# Patient Record
Sex: Male | Born: 1950
Health system: Southern US, Community
[De-identification: ages and names within clinical notes are randomized; demographics above are authoritative.]

## PROBLEM LIST (undated history)

## (undated) DIAGNOSIS — R918 Other nonspecific abnormal finding of lung field: Secondary | ICD-10-CM

## (undated) DIAGNOSIS — E559 Vitamin D deficiency, unspecified: Secondary | ICD-10-CM

## (undated) DIAGNOSIS — I639 Cerebral infarction, unspecified: Secondary | ICD-10-CM

## (undated) DIAGNOSIS — E1165 Type 2 diabetes mellitus with hyperglycemia: Secondary | ICD-10-CM

## (undated) DIAGNOSIS — E119 Type 2 diabetes mellitus without complications: Secondary | ICD-10-CM

## (undated) DIAGNOSIS — R7303 Prediabetes: Secondary | ICD-10-CM

## (undated) DIAGNOSIS — G629 Polyneuropathy, unspecified: Secondary | ICD-10-CM

## (undated) HISTORY — DX: Other nonspecific abnormal finding of lung field: R91.8

## (undated) HISTORY — PX: APPENDECTOMY: SHX54

---
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
## 2017-01-13 ENCOUNTER — Encounter (HOSPITAL_COMMUNITY): Payer: Self-pay | Admitting: *Deleted

## 2017-01-13 ENCOUNTER — Emergency Department (HOSPITAL_COMMUNITY)
Admission: EM | Admit: 2017-01-13 | Discharge: 2017-01-13 | Disposition: A | Discharge status: Home / Self Care | Payer: Medicare HMO | Attending: Emergency Medicine | Admitting: Emergency Medicine

## 2017-01-13 ENCOUNTER — Emergency Department (HOSPITAL_COMMUNITY): Payer: Medicare HMO

## 2017-01-13 DIAGNOSIS — M545 Low back pain: Secondary | ICD-10-CM | POA: Insufficient documentation

## 2017-01-13 DIAGNOSIS — M79604 Pain in right leg: Secondary | ICD-10-CM | POA: Diagnosis present

## 2017-01-13 DIAGNOSIS — M25551 Pain in right hip: Secondary | ICD-10-CM | POA: Diagnosis not present

## 2017-01-13 DIAGNOSIS — F17210 Nicotine dependence, cigarettes, uncomplicated: Secondary | ICD-10-CM | POA: Diagnosis not present

## 2017-01-13 HISTORY — DX: Prediabetes: R73.03

## 2017-01-13 MED ORDER — TRAMADOL HCL 50 MG PO TABS: 50.0000 mg | ORAL_TABLET | Freq: Four times a day (QID) | ORAL | 0 refills | Status: DC | PRN

## 2017-01-13 MED ORDER — DICLOFENAC SODIUM 75 MG PO TBEC: 75.0000 mg | DELAYED_RELEASE_TABLET | Freq: Two times a day (BID) | ORAL | 0 refills | Status: DC

## 2017-01-13 NOTE — ED Notes (Signed)
Pt made aware to return if symptoms worsen or if any life threatening symptoms occur.   

## 2017-01-13 NOTE — ED Triage Notes (Signed)
Pt reports right upper leg, hip, low back and knee pain x 7 days. Pt denies any known injury. Pt taking OTC pain medications without relief. Pt reports pain is tolerable during the day but is worse at night when he lays down. Describes pain as "throbbing"

## 2017-01-16 NOTE — ED Provider Notes (Signed)
[facility] DEPT Provider Note   CSN: [id number] Arrival date & time: 01/13/17  1324     History   Chief Complaint Chief Complaint  Patient presents with  . Leg Pain    HPI Collin Yoder is a 66 y.o. male presenting with a 1 week history of right hip and right upper thigh pain described as aching and throbbing which is worse at night when trying to sleep, during the day is more tolerable, but describes a more burning, tingling sensation in the mid thigh.  He denies a prior history of back or leg problems, denies any recent injury. There has been no weakness or numbness in the lower extremities and no urinary or bowel retention or incontinence.  Patient does not have a history of cancer or IVDU.  The patient has tried muscle rubs and tylenol without significant relief of symptoms. .  The history is provided by the patient.    Past Medical History:  Diagnosis Date  . Borderline diabetes     There are no active problems to display for this patient.   Past Surgical History:  Procedure Laterality Date  . APPENDECTOMY         Home Medications    Prior to Admission medications   Medication Sig Start Date End Date Taking? Authorizing Provider  diclofenac (VOLTAREN) 75 MG EC tablet Take 1 tablet (75 mg total) by mouth 2 (two) times daily. 01/13/17   Evalee Jefferson, PA-C  traMADol (ULTRAM) 50 MG tablet Take 1 tablet (50 mg total) by mouth every 6 (six) hours as needed. 01/13/17   Evalee Jefferson, PA-C    Family History No family history on file.  Social History Social History  Substance Use Topics  . Smoking status: Current Every Day Smoker    Packs/day: 1.00    Types: Cigarettes  . Smokeless tobacco: Never Used  . Alcohol use No     Allergies   Patient has no known allergies.   Review of Systems Review of Systems  Constitutional: Negative for fever.  Musculoskeletal: Positive for arthralgias and back pain. Negative for joint swelling and myalgias.  Neurological:  Negative for weakness and numbness.     Physical Exam Updated Vital Signs BP (!) 145/79 (BP Location: Right Arm)   Pulse 97   Temp 98.2 F (36.8 C) (Oral)   Resp 16   SpO2 97%   Physical Exam  Constitutional: He appears well-developed and well-nourished.  HENT:  Head: Normocephalic.  Eyes: Conjunctivae are normal.  Neck: Normal range of motion. Neck supple.  Cardiovascular: Normal rate and intact distal pulses.   Pedal pulses normal.  Pulmonary/Chest: Effort normal.  Abdominal: Soft. Bowel sounds are normal. He exhibits no distension and no mass.  Musculoskeletal: Normal range of motion. He exhibits no edema or tenderness.       Right hip: He exhibits normal range of motion, no tenderness, no bony tenderness and no swelling.       Lumbar back: He exhibits no swelling, no edema and no spasm.  Neurological: He is alert. He has normal strength. He displays no atrophy and no tremor. No sensory deficit. Gait normal.  Reflex Scores:      Patellar reflexes are 2+ on the right side and 2+ on the left side.      Achilles reflexes are 2+ on the right side and 2+ on the left side. No strength deficit noted in hip and knee flexor and extensor muscle groups.  Ankle flexion and extension intact.  Skin: Skin is warm and dry. No rash noted.  Psychiatric: He has a normal mood and affect.  Nursing note and vitals reviewed.    ED Treatments / Results  Labs (all labs ordered are listed, but only abnormal results are displayed) Labs Reviewed - No data to display  EKG  EKG Interpretation None       Radiology  No results found for this or any previous visit. Dg Hip Unilat W Or Wo Pelvis 2-3 Views Right  Result Date: 01/13/2017 CLINICAL DATA:  Right hip pain.  Initial encounter. EXAM: DG HIP (WITH OR WITHOUT PELVIS) 2-3V RIGHT COMPARISON:  None. FINDINGS: Right hip is located. No acute bone or soft tissue abnormalities are present. Pelvis is intact. Degenerative changes are noted in the  lower lumbar spine. Vascular calcifications are evident. IMPRESSION: 1. No acute or focal abnormality of the hip to explain the patient's pain. 2. Degenerative changes in the lower lumbar spine. 3. Atherosclerosis. Electronically Signed   By: San Morelle M.D.   On: 01/13/2017 14:20   Dg Femur Min 2 Views Right  Result Date: 01/13/2017 CLINICAL DATA:  Right leg pain for 5 days EXAM: RIGHT FEMUR 2 VIEWS COMPARISON:  None. FINDINGS: No acute fracture. No dislocation. Osteopenia. Vascular calcifications. IMPRESSION: No acute bony pathology. Electronically Signed   By: Marybelle Killings M.D.   On: 01/13/2017 14:20     Procedures Procedures (including critical care time)  Medications Ordered in ED Medications - No data to display   Initial Impression / Assessment and Plan / ED Course  I have reviewed the triage vital signs and the nursing notes.  Pertinent labs & imaging results that were available during my care of the patient were reviewed by me and considered in my medical decision making (see chart for details).     Pt also seen by Dr. Roderic Palau during this visit.  Suspect musculoskeletal source, possibly radicular pain, although no hx of lumbar issues.  No rash, doubt shingles. No leg edema.   Advised heat, rest, diclofenac, ultram, prn f/u - referrals for pcp given.  Final Clinical Impressions(s) / ED Diagnoses   Final diagnoses:  Right leg pain    New Prescriptions Discharge Medication List as of 01/13/2017  3:15 PM    START taking these medications   Details  diclofenac (VOLTAREN) 75 MG EC tablet Take 1 tablet (75 mg total) by mouth 2 (two) times daily., Starting Sat 01/13/2017, Print    traMADol (ULTRAM) 50 MG tablet Take 1 tablet (50 mg total) by mouth every 6 (six) hours as needed., Starting Sat 01/13/2017, Print         Evalee Jefferson, PA-C 01/16/17 1041    Milton Ferguson, MD 01/23/17 2127

## 2017-01-29 ENCOUNTER — Telehealth: Payer: Self-pay | Admitting: Orthopedic Surgery

## 2017-01-29 NOTE — Telephone Encounter (Signed)
Patient called to inquire if Dr Aline Brochure is accepting new patients. Relays he was seen at [facility] room 01/13/17 for right leg pain. Michela Pitcher it might be related to gout or possibly diabetes. States he was advised to see primary care and orthopaedics if not improving.  States he needs to find a new primary care doctor. Offered appointment, however, patient states he will pursue primary care first, and if needed, will call back to request appointment.  Patient's ph# [phone].

## 2017-02-01 DIAGNOSIS — M79606 Pain in leg, unspecified: Secondary | ICD-10-CM | POA: Diagnosis not present

## 2017-02-01 DIAGNOSIS — E559 Vitamin D deficiency, unspecified: Secondary | ICD-10-CM | POA: Diagnosis not present

## 2017-02-01 DIAGNOSIS — Z131 Encounter for screening for diabetes mellitus: Secondary | ICD-10-CM | POA: Diagnosis not present

## 2017-02-01 DIAGNOSIS — N401 Enlarged prostate with lower urinary tract symptoms: Secondary | ICD-10-CM | POA: Diagnosis not present

## 2017-02-01 DIAGNOSIS — R5383 Other fatigue: Secondary | ICD-10-CM | POA: Diagnosis not present

## 2017-02-01 DIAGNOSIS — N481 Balanitis: Secondary | ICD-10-CM | POA: Diagnosis not present

## 2017-02-01 DIAGNOSIS — E11610 Type 2 diabetes mellitus with diabetic neuropathic arthropathy: Secondary | ICD-10-CM | POA: Diagnosis not present

## 2017-02-01 DIAGNOSIS — Z125 Encounter for screening for malignant neoplasm of prostate: Secondary | ICD-10-CM | POA: Diagnosis not present

## 2017-02-01 DIAGNOSIS — Z13220 Encounter for screening for lipoid disorders: Secondary | ICD-10-CM | POA: Diagnosis not present

## 2017-02-08 DIAGNOSIS — Z713 Dietary counseling and surveillance: Secondary | ICD-10-CM | POA: Diagnosis not present

## 2017-02-08 DIAGNOSIS — E559 Vitamin D deficiency, unspecified: Secondary | ICD-10-CM | POA: Diagnosis not present

## 2017-02-08 DIAGNOSIS — M79606 Pain in leg, unspecified: Secondary | ICD-10-CM | POA: Diagnosis not present

## 2017-02-08 DIAGNOSIS — E11610 Type 2 diabetes mellitus with diabetic neuropathic arthropathy: Secondary | ICD-10-CM | POA: Diagnosis not present

## 2017-02-27 DIAGNOSIS — E11610 Type 2 diabetes mellitus with diabetic neuropathic arthropathy: Secondary | ICD-10-CM | POA: Diagnosis not present

## 2017-02-27 DIAGNOSIS — E559 Vitamin D deficiency, unspecified: Secondary | ICD-10-CM | POA: Diagnosis not present

## 2017-02-27 DIAGNOSIS — M79606 Pain in leg, unspecified: Secondary | ICD-10-CM | POA: Diagnosis not present

## 2017-03-29 ENCOUNTER — Emergency Department (HOSPITAL_COMMUNITY): Payer: Medicare HMO

## 2017-03-29 ENCOUNTER — Encounter (HOSPITAL_COMMUNITY): Payer: Self-pay | Admitting: *Deleted

## 2017-03-29 ENCOUNTER — Emergency Department (HOSPITAL_COMMUNITY)
Admission: EM | Admit: 2017-03-29 | Discharge: 2017-03-29 | Disposition: A | Discharge status: Home / Self Care | Payer: Medicare HMO | Attending: Emergency Medicine | Admitting: Emergency Medicine

## 2017-03-29 DIAGNOSIS — E119 Type 2 diabetes mellitus without complications: Secondary | ICD-10-CM | POA: Diagnosis not present

## 2017-03-29 DIAGNOSIS — R911 Solitary pulmonary nodule: Secondary | ICD-10-CM | POA: Diagnosis not present

## 2017-03-29 DIAGNOSIS — Z7984 Long term (current) use of oral hypoglycemic drugs: Secondary | ICD-10-CM | POA: Insufficient documentation

## 2017-03-29 DIAGNOSIS — K4020 Bilateral inguinal hernia, without obstruction or gangrene, not specified as recurrent: Secondary | ICD-10-CM | POA: Insufficient documentation

## 2017-03-29 DIAGNOSIS — F17210 Nicotine dependence, cigarettes, uncomplicated: Secondary | ICD-10-CM | POA: Insufficient documentation

## 2017-03-29 DIAGNOSIS — M79604 Pain in right leg: Secondary | ICD-10-CM | POA: Insufficient documentation

## 2017-03-29 DIAGNOSIS — Z0389 Encounter for observation for other suspected diseases and conditions ruled out: Secondary | ICD-10-CM | POA: Diagnosis not present

## 2017-03-29 DIAGNOSIS — J439 Emphysema, unspecified: Secondary | ICD-10-CM | POA: Diagnosis not present

## 2017-03-29 DIAGNOSIS — M79661 Pain in right lower leg: Secondary | ICD-10-CM | POA: Diagnosis not present

## 2017-03-29 DIAGNOSIS — R634 Abnormal weight loss: Secondary | ICD-10-CM | POA: Diagnosis not present

## 2017-03-29 HISTORY — DX: Type 2 diabetes mellitus without complications: E11.9

## 2017-03-29 LAB — URINALYSIS, ROUTINE W REFLEX MICROSCOPIC
BILIRUBIN URINE: NEGATIVE
Glucose, UA: 50 mg/dL — AB
Hgb urine dipstick: NEGATIVE
KETONES UR: NEGATIVE mg/dL
Leukocytes, UA: NEGATIVE
NITRITE: NEGATIVE
PROTEIN: NEGATIVE mg/dL
Specific Gravity, Urine: 1.02 (ref 1.005–1.030)
pH: 6 (ref 5.0–8.0)

## 2017-03-29 LAB — COMPREHENSIVE METABOLIC PANEL
ALK PHOS: 62 U/L (ref 38–126)
ALT: 13 U/L — AB (ref 17–63)
AST: 14 U/L — ABNORMAL LOW (ref 15–41)
Albumin: 4.2 g/dL (ref 3.5–5.0)
Anion gap: 9 (ref 5–15)
BUN: 13 mg/dL (ref 6–20)
CALCIUM: 9.5 mg/dL (ref 8.9–10.3)
CO2: 29 mmol/L (ref 22–32)
CREATININE: 0.83 mg/dL (ref 0.61–1.24)
Chloride: 103 mmol/L (ref 101–111)
Glucose, Bld: 176 mg/dL — ABNORMAL HIGH (ref 65–99)
Potassium: 3.6 mmol/L (ref 3.5–5.1)
Sodium: 141 mmol/L (ref 135–145)
Total Bilirubin: 0.8 mg/dL (ref 0.3–1.2)
Total Protein: 7.4 g/dL (ref 6.5–8.1)

## 2017-03-29 LAB — CBC WITH DIFFERENTIAL/PLATELET
Basophils Absolute: 0 10*3/uL (ref 0.0–0.1)
Basophils Relative: 1 %
EOS PCT: 3 %
Eosinophils Absolute: 0.3 10*3/uL (ref 0.0–0.7)
HCT: 38.2 % — ABNORMAL LOW (ref 39.0–52.0)
Hemoglobin: 13.2 g/dL (ref 13.0–17.0)
LYMPHS ABS: 2 10*3/uL (ref 0.7–4.0)
Lymphocytes Relative: 23 %
MCH: 31.2 pg (ref 26.0–34.0)
MCHC: 34.6 g/dL (ref 30.0–36.0)
MCV: 90.3 fL (ref 78.0–100.0)
MONOS PCT: 7 %
Monocytes Absolute: 0.6 10*3/uL (ref 0.1–1.0)
Neutro Abs: 5.9 10*3/uL (ref 1.7–7.7)
Neutrophils Relative %: 66 %
PLATELETS: 212 10*3/uL (ref 150–400)
RBC: 4.23 MIL/uL (ref 4.22–5.81)
RDW: 12.4 % (ref 11.5–15.5)
WBC: 8.8 10*3/uL (ref 4.0–10.5)

## 2017-03-29 LAB — LIPASE, BLOOD: LIPASE: 30 U/L (ref 11–51)

## 2017-03-29 MED ORDER — HYDROCODONE-ACETAMINOPHEN 5-325 MG PO TABS
1.0000 | ORAL_TABLET | Freq: Once | ORAL | Status: AC
  Administered 2017-03-29: 1 via ORAL
  Filled 2017-03-29: qty 1

## 2017-03-29 MED ORDER — LACTATED RINGERS IV BOLUS (SEPSIS)
1000.0000 mL | Freq: Once | INTRAVENOUS | Status: AC
  Administered 2017-03-29: 1000 mL via INTRAVENOUS

## 2017-03-29 MED ORDER — IOPAMIDOL (ISOVUE-300) INJECTION 61%
100.0000 mL | Freq: Once | INTRAVENOUS | Status: AC | PRN
  Administered 2017-03-29: 100 mL via INTRAVENOUS

## 2017-03-29 MED ORDER — HYDROCODONE-ACETAMINOPHEN 5-325 MG PO TABS: 1.0000 | ORAL_TABLET | Freq: Four times a day (QID) | ORAL | 0 refills | Status: DC | PRN

## 2017-03-29 NOTE — ED Provider Notes (Signed)
[facility] DEPT Provider Note   CSN: [id number] Arrival date & time: 03/29/17  1140     History   Chief Complaint Chief Complaint  Patient presents with  . Leg Pain    HPI Collin Yoder is a 66 y.o. male.   Leg Pain   This is a new problem. The current episode started more than 1 week ago. The problem occurs constantly. The problem has been gradually worsening. The pain is present in the right upper leg.    Past Medical History:  Diagnosis Date  . Borderline diabetes   . Diabetes mellitus without complication ([place])     There are no active problems to display for this patient.   Past Surgical History:  Procedure Laterality Date  . APPENDECTOMY         Home Medications    Prior to Admission medications   Medication Sig Start Date End Date Taking? Authorizing Provider  Cholecalciferol (VITAMIN D3) 5000 units CAPS Take 1 capsule by mouth daily.   Yes [provider]  metFORMIN (GLUCOPHAGE) 500 MG tablet Take 500 mg by mouth 2 (two) times daily with a meal.   Yes [provider]  diclofenac (VOLTAREN) 75 MG EC tablet Take 1 tablet (75 mg total) by mouth 2 (two) times daily. Patient not taking: Reported on 03/29/2017 01/13/17   Evalee Jefferson, PA-C  traMADol (ULTRAM) 50 MG tablet Take 1 tablet (50 mg total) by mouth every 6 (six) hours as needed. Patient not taking: Reported on 03/29/2017 01/13/17   Evalee Jefferson, PA-C    Family History No family history on file.  Social History Social History  Substance Use Topics  . Smoking status: Current Every Day Smoker    Packs/day: 1.00    Types: Cigarettes  . Smokeless tobacco: Never Used  . Alcohol use No     Allergies   Patient has no known allergies.   Review of Systems Review of Systems  Constitutional: Positive for activity change, appetite change and unexpected weight change.  Respiratory: Positive for cough.   Gastrointestinal: Positive for nausea.  Musculoskeletal:       Right leg  pain  All other systems reviewed and are negative.    Physical Exam Updated Vital Signs BP 109/72 (BP Location: Right Arm)   Pulse (!) 120   Temp 98.2 F (36.8 C) (Oral)   Resp 18   Ht 5\' 9"  (1.753 m)   Wt 61.2 kg (135 lb)   SpO2 96%   BMI 19.94 kg/m   Physical Exam  Constitutional: He appears well-developed.  Concave stomach, mildly cachectic  HENT:  Head: Normocephalic and atraumatic.  Eyes: Conjunctivae and EOM are normal.  Neck: Normal range of motion.  Cardiovascular: Normal rate.   Pulmonary/Chest: Effort normal. No respiratory distress.  Abdominal: Soft. He exhibits no distension.  Musculoskeletal: Normal range of motion.  Neurological: He is alert.  Skin: Skin is warm and dry. No erythema. No pallor.  Nursing note and vitals reviewed.    ED Treatments / Results  Labs (all labs ordered are listed, but only abnormal results are displayed) Labs Reviewed  CBC WITH DIFFERENTIAL/PLATELET  COMPREHENSIVE METABOLIC PANEL  URINALYSIS, ROUTINE W REFLEX MICROSCOPIC  LIPASE, BLOOD    EKG  EKG Interpretation None       Radiology No results found.  Procedures Procedures (including critical care time)  Medications Ordered in ED Medications  lactated ringers bolus 1,000 mL (not administered)  lactated ringers bolus 1,000 mL (not administered)  HYDROcodone-acetaminophen (  NORCO/VICODIN) 5-325 MG per tablet 1 tablet (not administered)     Initial Impression / Assessment and Plan / ED Course  I have reviewed the triage vital signs and the nursing notes.  Pertinent labs & imaging results that were available during my care of the patient were reviewed by me and considered in my medical decision making (see chart for details).    Concern for possible malignancy, DVT amongst other etiologies. Will work up appropriately.   Likely malignancy in RLL of lung. I discussed with Dr. Talbert Cage who will get him follow up appt for biopsy and to get worked up. Rest of workup  unremarkable. Suspect possible cancer related leg pain (lytic lesion possibly not seen on previous XR's) so if he gets a PET scan, it will likely show up there if that is the cause. No e/o septic joint/dvt/fracture at this time. Pain meds provided.   Final Clinical Impressions(s) / ED Diagnoses   Final diagnoses:  None    New Prescriptions New Prescriptions   No medications on file     Jamie-Lee Galdamez, Corene Cornea, MD 03/29/17 2147

## 2017-03-29 NOTE — ED Triage Notes (Signed)
Pt comes in with chronic right leg pain that doctors told him this was related to his diabetes (neuropathy). Pt states this leg "gave out" on him last night. He adds that he has lost 30 lbs in the last 2 months. Denies any n/v/d.

## 2017-04-02 ENCOUNTER — Encounter (HOSPITAL_COMMUNITY): Payer: Self-pay | Admitting: Oncology

## 2017-04-02 ENCOUNTER — Other Ambulatory Visit (HOSPITAL_COMMUNITY): Payer: Self-pay | Admitting: Oncology

## 2017-04-05 ENCOUNTER — Ambulatory Visit (HOSPITAL_COMMUNITY): Payer: Medicare HMO | Admitting: Oncology

## 2017-04-11 ENCOUNTER — Encounter (HOSPITAL_COMMUNITY): Payer: Medicare HMO | Attending: Oncology | Admitting: Oncology

## 2017-04-11 ENCOUNTER — Encounter (HOSPITAL_COMMUNITY): Payer: Self-pay | Admitting: Oncology

## 2017-04-11 ENCOUNTER — Ambulatory Visit (HOSPITAL_COMMUNITY)
Admission: RE | Admit: 2017-04-11 | Discharge: 2017-04-11 | Disposition: A | Discharge status: Home / Self Care | Payer: Medicare HMO | Source: Ambulatory Visit | Attending: Oncology | Admitting: Oncology

## 2017-04-11 ENCOUNTER — Other Ambulatory Visit (HOSPITAL_COMMUNITY): Payer: Self-pay | Admitting: Oncology

## 2017-04-11 VITALS — BP 102/66 | HR 111 | Temp 98.1°F | Resp 16 | Ht 69.0 in | Wt 131.2 lb

## 2017-04-11 DIAGNOSIS — R918 Other nonspecific abnormal finding of lung field: Secondary | ICD-10-CM

## 2017-04-11 DIAGNOSIS — M50321 Other cervical disc degeneration at C4-C5 level: Secondary | ICD-10-CM | POA: Diagnosis not present

## 2017-04-11 DIAGNOSIS — M79604 Pain in right leg: Secondary | ICD-10-CM

## 2017-04-11 DIAGNOSIS — M5136 Other intervertebral disc degeneration, lumbar region: Secondary | ICD-10-CM | POA: Diagnosis not present

## 2017-04-11 DIAGNOSIS — M5030 Other cervical disc degeneration, unspecified cervical region: Secondary | ICD-10-CM | POA: Diagnosis not present

## 2017-04-11 DIAGNOSIS — M50323 Other cervical disc degeneration at C6-C7 level: Secondary | ICD-10-CM | POA: Diagnosis not present

## 2017-04-11 DIAGNOSIS — M542 Cervicalgia: Secondary | ICD-10-CM

## 2017-04-11 HISTORY — DX: Other nonspecific abnormal finding of lung field: R91.8

## 2017-04-11 NOTE — Progress Notes (Incomplete)
[facility] Hematology/Oncology Consultation   Name: Collin Yoder      MRN: [id number]    Location: Room/bed info not found  Date: 04/11/2017 Time:3:42 PM   REFERRING PHYSICIAN:  [facility]  REASON FOR CONSULT:  Lung mass   DIAGNOSIS:  4.3 x 3.2 x 2.4 cm opacity in the RLL concerning for bronchogenic carcinoma  HISTORY OF PRESENT ILLNESS:   Collin Yoder is a 66 y.o. male with a medical history significant for hyperglycemia who is referred to the [facility] for abnormal imaging when he presented to the [facility] ED on 03/29/2017 with leg pain.  His ED workup included an ultrasound of his right leg that was negative for DVT.  CT chest abdomen pelvis was also performed in the ED.  This revealed a 4.3 cm right lower lobe pulmonary mass without any evidence of metastatic disease.  Given that the patient was asymptomatic for any pneumonia or infectious cause, concern for bronchogenic carcinoma increases.  As result, the patient is referred to the [facility] for further evaluation and management.  Patient understands why he is here today.  He notes that his major concern is his right leg pain but understands that the priority of our appointment today is about his lung mass.  He states that the lung mass is asymptomatic but his right leg pain is not.  He admits to a 35 pound weight loss but this is suspected to be secondary to treatment of his diabetes.  He reports a stable appetite.  He does admit to a decrease in energy.  He does have a cough but denies any hemoptysis.  He reports to shortness of breath with exertion.  He has bilateral numbness and tingling in his lower extremities.  He rates his appetite is 75%.  He rates his energy level at 50%.  He notes that his right leg pain is constant and he rates it as a 10 out of 10.  The location of his leg pain seems to be on the medial aspect of his lower leg.  He also reports some right neck  pain.  Review of Systems  Constitutional: Positive for weight loss. Negative for chills and fever.  HENT: Negative.   Eyes: Negative.   Respiratory: Positive for cough. Negative for hemoptysis.   Cardiovascular: Negative.  Negative for chest pain.  Gastrointestinal: Negative.  Negative for blood in stool, constipation, diarrhea, melena, nausea and vomiting.  Genitourinary: Negative.   Musculoskeletal: Positive for neck pain.       Right leg pain  Skin: Negative.   Neurological: Positive for sensory change (numbness/tingling of legs). Negative for weakness.  Endo/Heme/Allergies: Negative.   Psychiatric/Behavioral: Positive for substance abuse (tobacco abuse).     PAST MEDICAL HISTORY:   Past Medical History:  Diagnosis Date  . Borderline diabetes   . Diabetes mellitus without complication ([place])   . Right lower lobe lung mass 04/11/2017    ALLERGIES: No Known Allergies    MEDICATIONS: I have reviewed the patient's current medications.    Current Outpatient Prescriptions on File Prior to Visit  Medication Sig Dispense Refill  . Cholecalciferol (VITAMIN D3) 5000 units CAPS Take 1 capsule by mouth daily.    Marland Kitchen HYDROcodone-acetaminophen (NORCO/VICODIN) 5-325 MG tablet Take 1-2 tablets by mouth every 6 (six) hours as needed for severe pain. 20 tablet 0  . metFORMIN (GLUCOPHAGE) 500 MG tablet Take 500 mg by mouth 2 (two) times  daily with a meal.     No current facility-administered medications on file prior to visit.      PAST SURGICAL HISTORY Past Surgical History:  Procedure Laterality Date  . APPENDECTOMY      FAMILY HISTORY: He has 1 brother who lives in [place], [place].  He is healthy. He has 1 son who is 19 years old and is healthy to the best of patient's knowledge and he lives in [place], [place].  SOCIAL HISTORY:  reports that he has been smoking Cigarettes.  He has a 40.00 pack-year smoking history. He has never used smokeless tobacco. He reports  that he does not drink alcohol or use drugs.  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  He has Psychologist, forensic and religion.  Unfortunately, the patient was incarcerated for 6 years being released in November 2014 and is now on house arrest with bracelet on left lower leg.  Based upon brief conversation, it sounds as though he was incarcerated for statutory rape.  He reports that he is a "model" personal probation.  His probation officer is officer Anthoney Harada and his office number is [phone] and his cell phone number is [phone].  Social History   Social History  . Marital status: Single    Spouse name: N/A  . Number of children: N/A  . Years of education: N/A   Social History Main Topics  . Smoking status: Current Every Day Smoker    Packs/day: 1.00    Years: 40.00    Types: Cigarettes  . Smokeless tobacco: Never Used  . Alcohol use No  . Drug use: No  . Sexual activity: Not Asked   Other Topics Concern  . None   Social History Narrative  . None    PERFORMANCE STATUS: The patient's performance status is 1 - Symptomatic but completely ambulatory  PHYSICAL EXAM: Most Recent Vital Signs: Blood pressure 102/66, pulse (!) 111, temperature 98.1 F (36.7 C), temperature source Oral, resp. rate 16, height 5\' 9"  (1.753 m), weight 131 lb 3.2 oz (59.5 kg), SpO2 98 %. BP 102/66 (BP Location: Left Arm, Patient Position: Sitting)   Pulse (!) 111   Temp 98.1 F (36.7 C) (Oral)   Resp 16   Ht 5\' 9"  (1.753 m)   Wt 131 lb 3.2 oz (59.5 kg)   SpO2 98%   BMI 19.37 kg/m   General Appearance:    Alert, cooperative, no distress, appears stated age, unaccompanied  Head:    Normocephalic, without obvious abnormality, atraumatic  Eyes:    Conjunctiva/corneas clear, EOM's intact, both eyes       Ears:    Normal TM's and external ear canals, both ears  Nose:   Nares normal, septum midline, mucosa normal, no drainage    or sinus tenderness  Throat:   Lips, mucosa, and tongue  normal.  Neck:   Supple, symmetrical, trachea midline, no adenopathy  Back:     Symmetric, no curvature, ROM normal, no CVA tenderness  Lungs:     Clear to auscultation bilaterally, respirations unlabored, decreased breath sounds bilaterally.  Chest wall:    No tenderness or deformity  Heart:    Regular rate and rhythm, S1 and S2 normal, no murmur, rub   or gallop  Abdomen:     Soft, non-tender, bowel sounds active all four quadrants,    no masses, no organomegaly  Genitalia:    Not examined  Rectal:    Not examined  Extremities:  Extremities normal, atraumatic, no cyanosis or edema.  Left ankle monitor in place.  No abnormality of right leg.  Pulses:   Not examined  Skin:   Skin color, texture, turgor normal, no rashes or lesions  Lymph nodes:   Cervical, supraclavicular, and axillary nodes normal  Neurologic:   No focal deficits.    LABORATORY DATA:  CBC    Component Value Date/Time   WBC 8.8 03/29/2017 1255   RBC 4.23 03/29/2017 1255   HGB 13.2 03/29/2017 1255   HCT 38.2 (L) 03/29/2017 1255   PLT 212 03/29/2017 1255   MCV 90.3 03/29/2017 1255   MCH 31.2 03/29/2017 1255   MCHC 34.6 03/29/2017 1255   RDW 12.4 03/29/2017 1255   LYMPHSABS 2.0 03/29/2017 1255   MONOABS 0.6 03/29/2017 1255   EOSABS 0.3 03/29/2017 1255   BASOSABS 0.0 03/29/2017 1255     Chemistry      Component Value Date/Time   NA 141 03/29/2017 1255   K 3.6 03/29/2017 1255   CL 103 03/29/2017 1255   CO2 29 03/29/2017 1255   BUN 13 03/29/2017 1255   CREATININE 0.83 03/29/2017 1255      Component Value Date/Time   CALCIUM 9.5 03/29/2017 1255   ALKPHOS 62 03/29/2017 1255   AST 14 (L) 03/29/2017 1255   ALT 13 (L) 03/29/2017 1255   BILITOT 0.8 03/29/2017 1255       RADIOGRAPHY: Dg Cervical Spine Complete  Result Date: 04/11/2017 CLINICAL DATA:  Right neck pain.  New lung mass EXAM: CERVICAL SPINE - COMPLETE 4+ VIEW COMPARISON:  None. FINDINGS: Degenerative disc disease from C4-5 thru C6-7, most  pronounced at C5-6 with disc space narrowing and spurring. Bilateral neural foraminal narrowing at C4-5 thru C6-7 due to uncovertebral spurring. Degenerative facet disease. Normal alignment. No fracture. IMPRESSION: Degenerative disc and facet disease as above.  No acute findings. Electronically Signed   By: Rolm Baptise M.D.   On: 04/11/2017 14:02   Dg Thoracic Spine W/swimmers  Result Date: 04/11/2017 CLINICAL DATA:  Right neck and upper back pain. Right lower lung mass. EXAM: THORACIC SPINE - 3 VIEWS COMPARISON:  CT 03/29/2017 FINDINGS: Early degenerative changes with disc space narrowing and spurring throughout the thoracic spine. No fracture, subluxation or visible focal bony abnormality. IMPRESSION: No acute findings. Electronically Signed   By: Rolm Baptise M.D.   On: 04/11/2017 14:02       PATHOLOGY:  N/A  ASSESSMENT/PLAN: ***  No problem-specific Assessment & Plan notes found for this encounter.   ORDERS PLACED FOR THIS ENCOUNTER: Orders Placed This Encounter  Procedures  . DG Cervical Spine Complete  . NM PET Image Initial (PI) Skull Base To Thigh  . MR Brain W Wo Contrast  . MR Lumbar Spine W Wo Contrast    MEDICATIONS PRESCRIBED THIS ENCOUNTER: No orders of the defined types were placed in this encounter.   All questions were answered. The patient knows to call the clinic with any problems, questions or concerns. We can certainly see the patient much sooner if necessary.  Patient discussed with Dr. Talbert Cage and together we ascertained an up-to-date interval history, and examined the patient.  Dr. Talbert Cage developed the patient's assessment and plan.  This was a shared visit-consultation.  Her attestation will follow below.  This note is electronically signed by: Doy Mince 04/11/2017 3:42 PM

## 2017-04-11 NOTE — Patient Instructions (Signed)
You were seen today by Kirby Crigler, PA. Before leaving the hospital today we want you to get an xray of you neck and back. We will schedule you an appointment for a PET scan, MRI of your brain and back. Also, we are referring you to a cardiothoracic surgery.

## 2017-04-11 NOTE — Progress Notes (Unsigned)
[facility] Hematology/Oncology Consultation   Name: Collin Yoder      MRN: [id number]    Location: Room/bed info not found  Date: 04/11/2017 Time:4:33 PM   REFERRING PHYSICIAN:  [facility]  REASON FOR CONSULT:  Lung mass   DIAGNOSIS:  4.3 x 3.2 x 2.4 cm opacity in the RLL concerning for bronchogenic carcinoma  HISTORY OF PRESENT ILLNESS:   Collin Yoder is a 66 y.o. male with a medical history significant for hyperglycemia who is referred to the [facility] for abnormal imaging when he presented to the [facility] ED on 03/29/2017 with leg pain.  His workup in the emergency department included a Doppler study of his right leg.  This was negative for any DVT.  Additional workup included CT chest abdomen and pelvis.  Imaging demonstrated a 4.3 cm right lower lobe lesion concerning for bronchogenic carcinoma given the fact that the patient was asymptomatic for any infectious issues.  As result, the patient was referred to [facility] for further workup and evaluation.  Patient reports that his main complaint is pain in his right leg.  He says this been going on for quite some time.  He had this evaluated by his primary care physician who was planning on referring him to orthopod.  Patient reports that 5 weeks following his primary care appointment, he had not heard from orthopedic surgery regarding an appointment.  As result, he presented to the emergency department with his right lower leg pain described above.  He notes that the leg pain is constant.  He rates it as a 10 out of 10.  He also admits to a 35 pound weight loss which is thought to be secondary to diabetes and diabetic management.  He admits to a cough that is nonproductive.  He denies any hemoptysis.  He rates his appetite is 75%. He rates his energy level of 50%.  He reports shortness of breath upon exertion.  He does have bilateral lower extremity numbness and burning  secondary to diabetes.  In addition to his right leg pain, he notes right neck pain as well.  Review of Systems  Constitutional: Positive for weight loss. Negative for chills and fever.  HENT: Negative.   Eyes: Negative.   Respiratory: Positive for cough and shortness of breath. Negative for hemoptysis and sputum production.   Cardiovascular: Negative.  Negative for chest pain.  Gastrointestinal: Negative.  Negative for blood in stool, constipation, diarrhea, melena, nausea and vomiting.  Genitourinary: Negative.   Musculoskeletal: Positive for neck pain.       Right leg pain  Skin: Negative.   Neurological: Positive for sensory change (LE B/L). Negative for weakness.  Endo/Heme/Allergies: Negative.   Psychiatric/Behavioral: Positive for substance abuse (tobacco abuse).     PAST MEDICAL HISTORY:   Past Medical History:  Diagnosis Date  . Borderline diabetes   . Diabetes mellitus without complication ([place])   . Right lower lobe lung mass 04/11/2017    ALLERGIES: No Known Allergies    MEDICATIONS: I have reviewed the patient's current medications.    Current Outpatient Prescriptions on File Prior to Visit  Medication Sig Dispense Refill  . Cholecalciferol (VITAMIN D3) 5000 units CAPS Take 1 capsule by mouth daily.    Marland Kitchen HYDROcodone-acetaminophen (NORCO/VICODIN) 5-325 MG tablet Take 1-2 tablets by mouth every 6 (six) hours as needed for severe pain. 20 tablet 0  . metFORMIN (GLUCOPHAGE) 500 MG  tablet Take 500 mg by mouth 2 (two) times daily with a meal.     No current facility-administered medications on file prior to visit.      PAST SURGICAL HISTORY Past Surgical History:  Procedure Laterality Date  . APPENDECTOMY      FAMILY HISTORY:  1 brother who lives in [place], [place] One son 21 years old who is healthy who lives in [place], [place].  SOCIAL HISTORY:  reports that he has been smoking Cigarettes.  He has a 40.00 pack-year smoking history. He  has never used smokeless tobacco. He reports that he does not drink alcohol or use drugs.  He smokes 1 pack per day of cigarettes.  He works second shift at [employer] in [place], [place].  He is Psychologist, forensic and religion.  He is divorced 1.  Unfortunately, he was incarcerated for 6 years being released in November 2014 and remains on lifelong house arrest.  He has a left ankle monitor in place.  Based upon discussion, patient was convicted of statutory rape.  Social History   Social History  . Marital status: Single    Spouse name: N/A  . Number of children: N/A  . Years of education: N/A   Social History Main Topics  . Smoking status: Current Every Day Smoker    Packs/day: 1.00    Years: 40.00    Types: Cigarettes  . Smokeless tobacco: Never Used  . Alcohol use No  . Drug use: No  . Sexual activity: Not Asked   Other Topics Concern  . None   Social History Narrative  . None    PERFORMANCE STATUS: The patient's performance status is 1 - Symptomatic but completely ambulatory  PHYSICAL EXAM: Most Recent Vital Signs: Blood pressure 102/66, pulse (!) 111, temperature 98.1 F (36.7 C), temperature source Oral, resp. rate 16, height 5\' 9"  (1.753 m), weight 131 lb 3.2 oz (59.5 kg), SpO2 98 %. BP 102/66 (BP Location: Left Arm, Patient Position: Sitting)   Pulse (!) 111   Temp 98.1 F (36.7 C) (Oral)   Resp 16   Ht 5\' 9"  (1.753 m)   Wt 131 lb 3.2 oz (59.5 kg)   SpO2 98%   BMI 19.37 kg/m   General Appearance:    Alert, cooperative, no distress, appears stated age, unaccompanied  Head:    Normocephalic, without obvious abnormality, atraumatic  Eyes:    Conjunctiva/corneas clear, EOM's intact, both eyes       Ears:    Not examined  Nose:   Nares normal, septum midline, mucosa normal, no drainage    or sinus tenderness  Throat:   Lips, mucosa, and tongue normal.  Neck:   Supple, symmetrical, trachea midline, no adenopathy.  Back:     Symmetric, no curvature, ROM normal, no  CVA tenderness  Lungs:     Clear to auscultation bilaterally, respirations unlabored, decreased breath sounds bilaterally throughout  Chest wall:    No tenderness or deformity  Heart:    Regular rate and rhythm, S1 and S2 normal, no murmur, rub   or gallop  Abdomen:     Soft, non-tender, bowel sounds active all four quadrants,    no masses, no organomegaly  Genitalia:    Not examined  Rectal:    Not examined  Extremities:   Extremities normal, atraumatic, no cyanosis or edema.  Left ankle monitor in place.  Pulses:   Not examined  Skin:   Skin color, texture, turgor normal, no rashes  or lesions  Lymph nodes:   Cervical, supraclavicular, and axillary nodes normal  Neurologic:   No focal deficits    LABORATORY DATA:  CBC    Component Value Date/Time   WBC 8.8 03/29/2017 1255   RBC 4.23 03/29/2017 1255   HGB 13.2 03/29/2017 1255   HCT 38.2 (L) 03/29/2017 1255   PLT 212 03/29/2017 1255   MCV 90.3 03/29/2017 1255   MCH 31.2 03/29/2017 1255   MCHC 34.6 03/29/2017 1255   RDW 12.4 03/29/2017 1255   LYMPHSABS 2.0 03/29/2017 1255   MONOABS 0.6 03/29/2017 1255   EOSABS 0.3 03/29/2017 1255   BASOSABS 0.0 03/29/2017 1255     Chemistry      Component Value Date/Time   NA 141 03/29/2017 1255   K 3.6 03/29/2017 1255   CL 103 03/29/2017 1255   CO2 29 03/29/2017 1255   BUN 13 03/29/2017 1255   CREATININE 0.83 03/29/2017 1255      Component Value Date/Time   CALCIUM 9.5 03/29/2017 1255   ALKPHOS 62 03/29/2017 1255   AST 14 (L) 03/29/2017 1255   ALT 13 (L) 03/29/2017 1255   BILITOT 0.8 03/29/2017 1255       RADIOGRAPHY: Dg Cervical Spine Complete  Result Date: 04/11/2017 CLINICAL DATA:  Right neck pain.  New lung mass EXAM: CERVICAL SPINE - COMPLETE 4+ VIEW COMPARISON:  None. FINDINGS: Degenerative disc disease from C4-5 thru C6-7, most pronounced at C5-6 with disc space narrowing and spurring. Bilateral neural foraminal narrowing at C4-5 thru C6-7 due to uncovertebral spurring.  Degenerative facet disease. Normal alignment. No fracture. IMPRESSION: Degenerative disc and facet disease as above.  No acute findings. Electronically Signed   By: Rolm Baptise M.D.   On: 04/11/2017 14:02   Dg Thoracic Spine W/swimmers  Result Date: 04/11/2017 CLINICAL DATA:  Right neck and upper back pain. Right lower lung mass. EXAM: THORACIC SPINE - 3 VIEWS COMPARISON:  CT 03/29/2017 FINDINGS: Early degenerative changes with disc space narrowing and spurring throughout the thoracic spine. No fracture, subluxation or visible focal bony abnormality. IMPRESSION: No acute findings. Electronically Signed   By: Rolm Baptise M.D.   On: 04/11/2017 14:02       PATHOLOGY:  N/A  ASSESSMENT/PLAN:   Right lower lobe lung mass 4.3 x 3.2 x 2.4 cm opacity in the RLL concerning for bronchogenic carcinoma in the setting of 40 pack year smoking history.  No role for labs today.  Labs from June 2018 reviewed.  CBC diff, CMET.  I personally reviewed and went over laboratory results with the patient.  The results are noted within this dictation.  I personally reviewed and went over radiographic studies with the patient.  The results are noted within this dictation.  I personally reviewed the images in PACS.  CT chest abdomen pelvis demonstrates a 4.3 cm right lower lobe lesion concerning for primary bronchogenic carcinoma.  Rest of scan does not demonstrate any oncology related issues.  Doppler study of his right leg in June 2018 was negative for any DVT.  He did undergo a lumbar spine x-ray that demonstrated degenerative disease.  For his right leg pain, I have ordered a L-spine MRI to evaluate for any nerve impingement related to his degenerative disease that would explain his leg pain.  For his neck pain, I have ordered a cervical spine xray.  This demonstrates degenerative disc and facet disease.  I will complete his staging process including an PET scan and MRI brain w and wo  contrast.  I have placed a  call to his probation officer, Anthoney Harada (office # [phone]) about having the patient's leg monitor removed at time of MRI.  I have left a VM with my pager number and our office number to assist in coordinating this process.  Based upon MRI L-spine, will refer patient to ortho or vascular surgery.  I suspect his unilateral right leg pain is secondary to nerve impingement from degenerative disease versus a vascular issue.  We will refer patient to Cardiothoracic surgery as well for biopsy versus definitive surgical management based upon PET findings.  Patient will return in 3 weeks for follow-up.     ORDERS PLACED FOR THIS ENCOUNTER: Orders Placed This Encounter  Procedures  . DG Cervical Spine Complete  . NM PET Image Initial (PI) Skull Base To Thigh  . MR Brain W Wo Contrast  . MR Lumbar Spine W Wo Contrast    MEDICATIONS PRESCRIBED THIS ENCOUNTER: No orders of the defined types were placed in this encounter.   All questions were answered. The patient knows to call the clinic with any problems, questions or concerns. We can certainly see the patient much sooner if necessary.  Patient discussed with Dr. Talbert Cage and together we ascertained an up-to-date interval history, and examined the patient.  Dr. Talbert Cage developed the patient's assessment and plan.  This was a shared visit-consultation.  Her attestation will follow below.  This note is electronically signed by: Doy Mince 04/11/2017 4:33 PM

## 2017-04-11 NOTE — Assessment & Plan Note (Signed)
4.3 x 3.2 x 2.4 cm opacity in the RLL concerning for bronchogenic carcinoma in the setting of 40 pack year smoking history.  No role for labs today.  Labs from June 2018 reviewed.  CBC diff, CMET.  I personally reviewed and went over laboratory results with the patient.  The results are noted within this dictation.  I personally reviewed and went over radiographic studies with the patient.  The results are noted within this dictation.  I personally reviewed the images in PACS.  CT chest abdomen pelvis demonstrates a 4.3 cm right lower lobe lesion concerning for primary bronchogenic carcinoma.  Rest of scan does not demonstrate any oncology related issues.  Doppler study of his right leg in June 2018 was negative for any DVT.  He did undergo a lumbar spine x-ray that demonstrated degenerative disease.  For his right leg pain, I have ordered a L-spine MRI to evaluate for any nerve impingement related to his degenerative disease that would explain his leg pain.  For his neck pain, I have ordered a cervical spine xray.  This demonstrates degenerative disc and facet disease.  I will complete his staging process including an PET scan and MRI brain w and wo contrast.  I have placed a call to his probation officer, Anthoney Harada (office # [phone]) about having the patient's leg monitor removed at time of MRI.  I have left a VM with my pager number and our office number to assist in coordinating this process.  Based upon MRI L-spine, will refer patient to ortho or vascular surgery.  I suspect his unilateral right leg pain is secondary to nerve impingement from degenerative disease versus a vascular issue.  We will refer patient to Cardiothoracic surgery as well for biopsy versus definitive surgical management based upon PET findings.  Patient will return in 3 weeks for follow-up.

## 2017-04-16 ENCOUNTER — Other Ambulatory Visit (HOSPITAL_COMMUNITY): Payer: Self-pay | Admitting: Oncology

## 2017-04-18 ENCOUNTER — Ambulatory Visit (HOSPITAL_COMMUNITY)
Admission: RE | Admit: 2017-04-18 | Discharge: 2017-04-18 | Disposition: A | Discharge status: Home / Self Care | Payer: Medicare HMO | Source: Ambulatory Visit | Attending: Oncology | Admitting: Oncology

## 2017-04-18 DIAGNOSIS — M48061 Spinal stenosis, lumbar region without neurogenic claudication: Secondary | ICD-10-CM | POA: Insufficient documentation

## 2017-04-18 DIAGNOSIS — M79604 Pain in right leg: Secondary | ICD-10-CM | POA: Diagnosis not present

## 2017-04-18 DIAGNOSIS — R918 Other nonspecific abnormal finding of lung field: Secondary | ICD-10-CM | POA: Diagnosis not present

## 2017-04-18 DIAGNOSIS — M4856XA Collapsed vertebra, not elsewhere classified, lumbar region, initial encounter for fracture: Secondary | ICD-10-CM | POA: Insufficient documentation

## 2017-04-18 DIAGNOSIS — M5136 Other intervertebral disc degeneration, lumbar region: Secondary | ICD-10-CM | POA: Diagnosis not present

## 2017-04-18 DIAGNOSIS — R51 Headache: Secondary | ICD-10-CM | POA: Diagnosis not present

## 2017-04-18 MED ORDER — GADOBENATE DIMEGLUMINE 529 MG/ML IV SOLN
15.0000 mL | Freq: Once | INTRAVENOUS | Status: AC | PRN
  Administered 2017-04-18: 11 mL via INTRAVENOUS

## 2017-04-19 ENCOUNTER — Encounter (HOSPITAL_COMMUNITY): Payer: Self-pay | Admitting: Oncology

## 2017-04-19 NOTE — Progress Notes (Unsigned)
Patient referred to [facility].  Appt. Scheduled for 05/02/17 @ 2:45 w/Dr Tonita Cong. Pt aware.  Faxed pt records to [phone].

## 2017-04-24 ENCOUNTER — Telehealth (HOSPITAL_COMMUNITY): Payer: Self-pay | Admitting: Oncology

## 2017-04-24 NOTE — Telephone Encounter (Signed)
PET scan is approved after peer to peer completed.  KEFALAS,THOMAS, PA-C 04/24/2017 1:11 PM

## 2017-04-25 ENCOUNTER — Encounter (HOSPITAL_COMMUNITY): Payer: Medicare HMO

## 2017-05-01 ENCOUNTER — Encounter: Payer: Medicare HMO | Admitting: Thoracic Surgery (Cardiothoracic Vascular Surgery)

## 2017-05-02 ENCOUNTER — Ambulatory Visit (HOSPITAL_COMMUNITY): Payer: Medicare HMO

## 2017-05-04 ENCOUNTER — Encounter (HOSPITAL_COMMUNITY)
Admission: RE | Admit: 2017-05-04 | Discharge: 2017-05-04 | Disposition: A | Discharge status: Home / Self Care | Payer: Medicare HMO | Source: Ambulatory Visit | Attending: Oncology | Admitting: Oncology

## 2017-05-04 ENCOUNTER — Telehealth (HOSPITAL_COMMUNITY): Payer: Self-pay | Admitting: Oncology

## 2017-05-04 DIAGNOSIS — R918 Other nonspecific abnormal finding of lung field: Secondary | ICD-10-CM | POA: Diagnosis not present

## 2017-05-04 LAB — GLUCOSE, CAPILLARY: Glucose-Capillary: 172 mg/dL — ABNORMAL HIGH (ref 65–99)

## 2017-05-04 MED ORDER — FLUDEOXYGLUCOSE F - 18 (FDG) INJECTION
5.9100 | Freq: Once | INTRAVENOUS | Status: AC | PRN
  Administered 2017-05-04: 5.91 via INTRAVENOUS

## 2017-05-04 NOTE — Telephone Encounter (Signed)
Called patient today to give him results of his PET scan. There does not appear to be any evidence of malignancy on his PET scan. He does have evidence of a resolving right lower lobe opacification which is likely secondary to infectious process. I have advised him that he also has a new infrahilar nodularity last mildly metabolically active which favors focus of infection, but he will need a follow-up CT of the chest in 3 months to assess resolution of his infectious processes. I also made him aware that he has a small right middle lobe pulmonary nodule which will need a CT chest without contrast for follow-up in one year.  Since he does not have any evidence of malignancy, we will cancel his follow-up up appointment here.  I have advised him to see his primary care physician, Dr. Anastasio Champion, for repeat CT chest in 3 months. I will send a message to his primary care physician as well regarding the need for a CT chest. Patient verbalized understanding.

## 2017-05-09 ENCOUNTER — Ambulatory Visit (HOSPITAL_COMMUNITY): Payer: Medicare HMO

## 2017-05-18 DIAGNOSIS — M545 Low back pain: Secondary | ICD-10-CM | POA: Diagnosis not present

## 2017-05-18 DIAGNOSIS — M5136 Other intervertebral disc degeneration, lumbar region: Secondary | ICD-10-CM | POA: Diagnosis not present

## 2017-06-28 DIAGNOSIS — M79606 Pain in leg, unspecified: Secondary | ICD-10-CM | POA: Diagnosis not present

## 2017-06-28 DIAGNOSIS — E559 Vitamin D deficiency, unspecified: Secondary | ICD-10-CM | POA: Diagnosis not present

## 2017-06-28 DIAGNOSIS — E11610 Type 2 diabetes mellitus with diabetic neuropathic arthropathy: Secondary | ICD-10-CM | POA: Diagnosis not present

## 2017-06-28 DIAGNOSIS — R5383 Other fatigue: Secondary | ICD-10-CM | POA: Diagnosis not present

## 2017-07-09 ENCOUNTER — Ambulatory Visit: Payer: Medicare HMO | Admitting: Neurology

## 2017-07-09 ENCOUNTER — Telehealth: Payer: Self-pay | Admitting: *Deleted

## 2017-07-09 NOTE — Telephone Encounter (Signed)
No showed new patient appointment. 

## 2017-07-10 ENCOUNTER — Encounter: Payer: Self-pay | Admitting: Neurology

## 2017-07-26 DIAGNOSIS — R634 Abnormal weight loss: Secondary | ICD-10-CM | POA: Diagnosis not present

## 2017-07-26 DIAGNOSIS — Z23 Encounter for immunization: Secondary | ICD-10-CM | POA: Diagnosis not present

## 2017-08-02 ENCOUNTER — Other Ambulatory Visit (HOSPITAL_COMMUNITY): Payer: Self-pay | Admitting: Internal Medicine

## 2017-08-02 ENCOUNTER — Ambulatory Visit (HOSPITAL_COMMUNITY)
Admission: RE | Admit: 2017-08-02 | Discharge: 2017-08-02 | Disposition: A | Discharge status: Home / Self Care | Payer: Medicare HMO | Source: Ambulatory Visit | Attending: Internal Medicine | Admitting: Internal Medicine

## 2017-08-02 DIAGNOSIS — R634 Abnormal weight loss: Secondary | ICD-10-CM | POA: Insufficient documentation

## 2017-08-08 ENCOUNTER — Encounter: Payer: Self-pay | Admitting: Gastroenterology

## 2017-09-28 ENCOUNTER — Ambulatory Visit: Payer: Medicare HMO | Admitting: Gastroenterology

## 2017-09-28 ENCOUNTER — Encounter: Payer: Self-pay | Admitting: Gastroenterology

## 2017-09-28 ENCOUNTER — Telehealth: Payer: Self-pay | Admitting: Gastroenterology

## 2017-09-28 NOTE — Telephone Encounter (Signed)
PATIENT WAS A NO SHOW AND LETTER SENT  °

## 2017-11-01 DIAGNOSIS — E11610 Type 2 diabetes mellitus with diabetic neuropathic arthropathy: Secondary | ICD-10-CM | POA: Diagnosis not present

## 2017-11-01 DIAGNOSIS — E559 Vitamin D deficiency, unspecified: Secondary | ICD-10-CM | POA: Diagnosis not present

## 2017-11-01 DIAGNOSIS — R5383 Other fatigue: Secondary | ICD-10-CM | POA: Diagnosis not present

## 2017-11-01 DIAGNOSIS — M79606 Pain in leg, unspecified: Secondary | ICD-10-CM | POA: Diagnosis not present

## 2017-12-04 ENCOUNTER — Ambulatory Visit: Payer: Medicare HMO | Admitting: Gastroenterology

## 2017-12-04 ENCOUNTER — Other Ambulatory Visit: Payer: Self-pay

## 2017-12-04 ENCOUNTER — Encounter: Payer: Self-pay | Admitting: Gastroenterology

## 2017-12-04 ENCOUNTER — Telehealth: Payer: Self-pay

## 2017-12-04 VITALS — BP 122/72 | HR 89 | Temp 97.9°F | Ht 69.0 in | Wt 146.4 lb

## 2017-12-04 DIAGNOSIS — R05 Cough: Secondary | ICD-10-CM | POA: Diagnosis not present

## 2017-12-04 DIAGNOSIS — R109 Unspecified abdominal pain: Secondary | ICD-10-CM | POA: Insufficient documentation

## 2017-12-04 DIAGNOSIS — R634 Abnormal weight loss: Secondary | ICD-10-CM

## 2017-12-04 DIAGNOSIS — R918 Other nonspecific abnormal finding of lung field: Secondary | ICD-10-CM | POA: Diagnosis not present

## 2017-12-04 DIAGNOSIS — R911 Solitary pulmonary nodule: Secondary | ICD-10-CM | POA: Diagnosis not present

## 2017-12-04 DIAGNOSIS — R1010 Upper abdominal pain, unspecified: Secondary | ICD-10-CM | POA: Diagnosis not present

## 2017-12-04 DIAGNOSIS — J449 Chronic obstructive pulmonary disease, unspecified: Secondary | ICD-10-CM | POA: Diagnosis not present

## 2017-12-04 DIAGNOSIS — R059 Cough, unspecified: Secondary | ICD-10-CM

## 2017-12-04 DIAGNOSIS — E119 Type 2 diabetes mellitus without complications: Secondary | ICD-10-CM | POA: Diagnosis not present

## 2017-12-04 MED ORDER — PEG 3350-KCL-NA BICARB-NACL 420 G PO SOLR: 4000.0000 mL | ORAL | 0 refills | Status: DC

## 2017-12-04 NOTE — Telephone Encounter (Signed)
PA info for CT Chest with contrast submitted via [employer] website for [facility]. Case went to clinical review. Will fax clinical notes after OV is completed.

## 2017-12-04 NOTE — Progress Notes (Signed)
Primary Care Physician:  Doree Albee, MD  Primary Gastroenterologist:  Garfield Cornea, MD   Chief Complaint  Patient presents with  . Colonoscopy    consult  . Weight Loss    Improved    HPI:  Collin Yoder is a 67 y.o. male here at the request of Dr. Anastasio Champion for abnormal weight loss and needing a colonoscopy. Patient states his baseline weight is 165 pounds. Documented weight back in May 2018 was 152 pounds at that point. He came all way down to 122 pounds in October 2018. He is back up to 146 pounds today.   Feels like his appetite is been good. He feels like he is eating adequately. Last year he had a scare with possible lung cancer. CT chest/abdomen/pelvis back in June done to evaluate weight loss showed a 4.3 x 3.2 x 2.4 cm patchy income fluid capacity with air bronco grams in the right lower lobe suspicious for lung adenocarcinoma. He also had mild changes of COPD and chronic bronchitis. Dense calcified coronary artery and aortic atherosclerosis. Prostate moderately enlarged. Small bilateral inguinal hernias containing fat.  He was seen by oncology who ordered a pet scan. He had interval resolution of the right lower lobe lesion consistent with resolution of infectious process. Small 4 mm nodule in the right middle lobe remained. Meeting follow-up chest CT and 12 months. new infrahilar nodular thickening in the left lung measuring 13 mm with mild metabolic activity favoring the focus of infection, recommended follow-up chest CT in three months however. Sub plural nodule on the left lower lobe unchanged at 7 mm.  Patient tells me he did not know he needed follow-up imaging. He states he was told that he was cancer free, that he just had pneumonia.  He complains of upper abdominal discomfort. Unrelated to meals. Unrelated to position. Has noted more since his weight loss. Denies heartburn. No dysphagia. No vomiting. Reports normal bowel function. No melena rectal bleeding. No prior  colonoscopy or upper endoscopy.  Last couple of months a lot of coughing and phlegm. Yellow looking phlegm. Spoke with Dr. Anastasio Champion. Chest x-ray performed November. Previous noted pneumonia in the right lung base was no long seen.  Patient is currently on house arrest, states his bracelet comes off in three months. He would not elaborate on the reason for the arrest.  Current Outpatient Medications  Medication Sig Dispense Refill  . Cholecalciferol (VITAMIN D3) 5000 units CAPS Take 1 capsule by mouth daily.    Marland Kitchen LYRICA 100 MG capsule Take 1 capsule by mouth 3 (three) times daily.    . metFORMIN (GLUCOPHAGE) 500 MG tablet Take 500 mg by mouth 2 (two) times daily with a meal.     No current facility-administered medications for this visit.     Allergies as of 12/04/2017  . (No Known Allergies)    Past Medical History:  Diagnosis Date  . Borderline diabetes   . Diabetes mellitus without complication ([place])   . Right lower lobe lung mass 04/11/2017    Past Surgical History:  Procedure Laterality Date  . APPENDECTOMY      Family History  Problem Relation Age of Onset  . Diabetes Mother 71  . Stroke Mother   . Colon cancer Neg Hx     Social History   Socioeconomic History  . Marital status: Single    Spouse name: Not on file  . Number of children: Not on file  . Years of education: Not on file  .  Highest education level: Not on file  Social Needs  . Financial resource strain: Not on file  . Food insecurity - worry: Not on file  . Food insecurity - inability: Not on file  . Transportation needs - medical: Not on file  . Transportation needs - non-medical: Not on file  Occupational History  . Not on file  Tobacco Use  . Smoking status: Current Every Day Smoker    Packs/day: 1.00    Years: 40.00    Pack years: 40.00    Types: Cigarettes  . Smokeless tobacco: Never Used  Substance and Sexual Activity  . Alcohol use: No  . Drug use: No  . Sexual activity: Not on file   Other Topics Concern  . Not on file  Social History Narrative  . Not on file      ROS:  General: Negative for anorexia,  fever, chills, fatigue, weakness. See hpi Eyes: Negative for vision changes.  ENT: Negative for hoarseness, difficulty swallowing , nasal congestion. CV: Negative for chest pain, angina, palpitations, dyspnea on exertion, peripheral edema.  Respiratory: Negative for dyspnea at rest, dyspnea on exertion, +cough, +sputum, no wheezing.  GI: See history of present illness. GU:  Negative for dysuria, hematuria, urinary incontinence, urinary frequency, nocturnal urination.  MS: left leg and back pain  Derm: Negative for rash or itching.  Neuro: Negative for weakness, abnormal sensation, seizure, frequent headaches, memory loss, confusion.  Psych: Negative for anxiety, depression, suicidal ideation, hallucinations.  Endo: see hpi Heme: Negative for bruising or bleeding. Allergy: Negative for rash or hives.    Physical Examination:  BP 122/72   Pulse 89   Temp 97.9 F (36.6 C) (Oral)   Ht 5\' 9"  (1.753 m)   Wt 146 lb 6.4 oz (66.4 kg)   BMI 21.62 kg/m    General: Well-nourished, well-developed in no acute distress.  Head: Normocephalic, atraumatic.   Eyes: Conjunctiva pink, no icterus. Mouth: Oropharyngeal mucosa moist and pink , no lesions erythema or exudate. Neck: Supple without thyromegaly, masses, or lymphadenopathy.  Lungs: Clear to auscultation bilaterally.  Heart: Regular rate and rhythm, no murmurs rubs or gallops.  Abdomen: Bowel sounds are normal, very thin with prominent ribs, mild upper abd tenderness, nondistended, no hepatosplenomegaly or masses, no abdominal bruits or    hernia , no rebound or guarding.   Rectal: not performed Extremities: No lower extremity edema. No clubbing or deformities.  Neuro: Alert and oriented x 4 , grossly normal neurologically.  Skin: Warm and dry, no rash or jaundice.   Psych: Alert and cooperative, normal mood  and affect.  Labs: Lab Results  Component Value Date   CREATININE 0.83 03/29/2017   BUN 13 03/29/2017   NA 141 03/29/2017   K 3.6 03/29/2017   CL 103 03/29/2017   CO2 29 03/29/2017   Lab Results  Component Value Date   ALT 13 (L) 03/29/2017   AST 14 (L) 03/29/2017   ALKPHOS 62 03/29/2017   BILITOT 0.8 03/29/2017   Lab Results  Component Value Date   WBC 8.8 03/29/2017   HGB 13.2 03/29/2017   HCT 38.2 (L) 03/29/2017   MCV 90.3 03/29/2017   PLT 212 03/29/2017   Lab Results  Component Value Date   LIPASE 30 03/29/2017     Imaging Studies: No results found.

## 2017-12-04 NOTE — Telephone Encounter (Signed)
CT Chest approved. PA# [id number], 12/13/17-01/12/18.

## 2017-12-04 NOTE — Patient Instructions (Signed)
1. Chest CT in near future.  2. Colonoscopy with possible upper endoscopy to evaluate weight loss, upper abdominal pain.

## 2017-12-07 ENCOUNTER — Encounter: Payer: Self-pay | Admitting: Gastroenterology

## 2017-12-07 NOTE — Assessment & Plan Note (Signed)
History of right lung mass as outlined. Follow-up PET showed resolution of right lower lobe lung mass indicating infectious process. However he had a right middle lobe 4 mm nodule needing following as well as new infrahilar nodular thickening in the left lung measuring 13 mm with mild metabolic activity which he did a follow-up chest CT back in November. Patient was unaware of need for follow-up imaging.  Patient requests that we proceed with Chest CT at this time and giving weight loss concerns, worsening cough we will make arrangements.

## 2017-12-07 NOTE — Assessment & Plan Note (Signed)
67 year old gentleman with significant weight loss, upper abdominal pain with no prior colonoscopy/endoscopy. Recommend upper endoscopy and colonoscopy in the near future to evaluate weight loss and upper abdominal pain.  I have discussed the risks, alternatives, benefits with regards to but not limited to the risk of reaction to medication, bleeding, infection, perforation and the patient is agreeable to proceed. Written consent to be obtained.

## 2017-12-10 NOTE — Progress Notes (Signed)
CC'D TO PCP °

## 2017-12-13 ENCOUNTER — Ambulatory Visit (HOSPITAL_COMMUNITY): Payer: Medicare HMO

## 2017-12-26 ENCOUNTER — Ambulatory Visit (HOSPITAL_COMMUNITY): Payer: Medicare HMO

## 2018-01-07 ENCOUNTER — Ambulatory Visit (HOSPITAL_COMMUNITY): Payer: Medicare HMO

## 2018-01-09 ENCOUNTER — Ambulatory Visit: Payer: Medicare HMO | Admitting: Neurology

## 2018-01-10 ENCOUNTER — Telehealth: Payer: Self-pay | Admitting: *Deleted

## 2018-01-10 NOTE — Telephone Encounter (Signed)
Received VM stating he will need to cancel his procedure scheduled for tomorrow. He has been sick and has "some cracked ribs". He will call back to reschedule when he is feeling better. FYI to LSL.

## 2018-01-11 ENCOUNTER — Ambulatory Visit (HOSPITAL_COMMUNITY): Admission: RE | Admit: 2018-01-11 | Payer: Medicare HMO | Source: Ambulatory Visit | Admitting: Internal Medicine

## 2018-01-11 ENCOUNTER — Encounter (HOSPITAL_COMMUNITY): Admission: RE | Payer: Self-pay | Source: Ambulatory Visit

## 2018-01-11 SURGERY — COLONOSCOPY
Anesthesia: Moderate Sedation

## 2018-01-11 NOTE — Telephone Encounter (Signed)
Patient also cancelled his Chest CT or no showed?  Please find out if he plans to follow through on that one.

## 2018-01-11 NOTE — Telephone Encounter (Signed)
LMOVM

## 2018-01-14 NOTE — Telephone Encounter (Signed)
LMOVM. According to radiology notes on the order he cancelled the appt x 3.

## 2018-01-30 DIAGNOSIS — R5383 Other fatigue: Secondary | ICD-10-CM | POA: Diagnosis not present

## 2018-01-30 DIAGNOSIS — E559 Vitamin D deficiency, unspecified: Secondary | ICD-10-CM | POA: Diagnosis not present

## 2018-01-30 DIAGNOSIS — M79606 Pain in leg, unspecified: Secondary | ICD-10-CM | POA: Diagnosis not present

## 2018-01-30 DIAGNOSIS — E11610 Type 2 diabetes mellitus with diabetic neuropathic arthropathy: Secondary | ICD-10-CM | POA: Diagnosis not present

## 2018-04-10 DIAGNOSIS — R5383 Other fatigue: Secondary | ICD-10-CM | POA: Diagnosis not present

## 2018-04-10 DIAGNOSIS — E559 Vitamin D deficiency, unspecified: Secondary | ICD-10-CM | POA: Diagnosis not present

## 2018-04-10 DIAGNOSIS — E11610 Type 2 diabetes mellitus with diabetic neuropathic arthropathy: Secondary | ICD-10-CM | POA: Diagnosis not present

## 2018-07-15 DIAGNOSIS — R635 Abnormal weight gain: Secondary | ICD-10-CM | POA: Diagnosis not present

## 2018-07-15 DIAGNOSIS — E559 Vitamin D deficiency, unspecified: Secondary | ICD-10-CM | POA: Diagnosis not present

## 2018-07-15 DIAGNOSIS — E11610 Type 2 diabetes mellitus with diabetic neuropathic arthropathy: Secondary | ICD-10-CM | POA: Diagnosis not present

## 2018-07-15 DIAGNOSIS — Z23 Encounter for immunization: Secondary | ICD-10-CM | POA: Diagnosis not present

## 2018-07-15 DIAGNOSIS — R5383 Other fatigue: Secondary | ICD-10-CM | POA: Diagnosis not present

## 2018-10-21 DIAGNOSIS — R5383 Other fatigue: Secondary | ICD-10-CM | POA: Diagnosis not present

## 2018-10-21 DIAGNOSIS — E559 Vitamin D deficiency, unspecified: Secondary | ICD-10-CM | POA: Diagnosis not present

## 2018-10-21 DIAGNOSIS — E11610 Type 2 diabetes mellitus with diabetic neuropathic arthropathy: Secondary | ICD-10-CM | POA: Diagnosis not present

## 2018-10-21 DIAGNOSIS — R6882 Decreased libido: Secondary | ICD-10-CM | POA: Diagnosis not present

## 2018-10-21 DIAGNOSIS — R635 Abnormal weight gain: Secondary | ICD-10-CM | POA: Diagnosis not present

## 2019-01-21 ENCOUNTER — Ambulatory Visit (INDEPENDENT_AMBULATORY_CARE_PROVIDER_SITE_OTHER): Payer: Medicare HMO | Admitting: Nurse Practitioner

## 2019-01-27 ENCOUNTER — Encounter (HOSPITAL_COMMUNITY): Payer: Self-pay | Admitting: *Deleted

## 2019-01-27 ENCOUNTER — Emergency Department (HOSPITAL_COMMUNITY)
Admission: EM | Admit: 2019-01-27 | Discharge: 2019-01-27 | Disposition: A | Discharge status: Home / Self Care | Payer: Medicare HMO | Attending: Emergency Medicine | Admitting: Emergency Medicine

## 2019-01-27 ENCOUNTER — Emergency Department (HOSPITAL_COMMUNITY): Payer: Medicare HMO

## 2019-01-27 ENCOUNTER — Other Ambulatory Visit: Payer: Self-pay

## 2019-01-27 DIAGNOSIS — R202 Paresthesia of skin: Secondary | ICD-10-CM | POA: Diagnosis not present

## 2019-01-27 DIAGNOSIS — I639 Cerebral infarction, unspecified: Secondary | ICD-10-CM | POA: Insufficient documentation

## 2019-01-27 DIAGNOSIS — I6389 Other cerebral infarction: Secondary | ICD-10-CM | POA: Diagnosis not present

## 2019-01-27 DIAGNOSIS — Z79899 Other long term (current) drug therapy: Secondary | ICD-10-CM | POA: Diagnosis not present

## 2019-01-27 DIAGNOSIS — E1165 Type 2 diabetes mellitus with hyperglycemia: Secondary | ICD-10-CM | POA: Insufficient documentation

## 2019-01-27 DIAGNOSIS — R200 Anesthesia of skin: Secondary | ICD-10-CM

## 2019-01-27 DIAGNOSIS — I6381 Other cerebral infarction due to occlusion or stenosis of small artery: Secondary | ICD-10-CM

## 2019-01-27 LAB — APTT: aPTT: 29 seconds (ref 24–36)

## 2019-01-27 LAB — CBG MONITORING, ED: Glucose-Capillary: 523 mg/dL (ref 70–99)

## 2019-01-27 LAB — COMPREHENSIVE METABOLIC PANEL
ALT: 13 U/L (ref 0–44)
AST: 14 U/L — ABNORMAL LOW (ref 15–41)
Albumin: 4.3 g/dL (ref 3.5–5.0)
Alkaline Phosphatase: 74 U/L (ref 38–126)
Anion gap: 10 (ref 5–15)
BUN: 11 mg/dL (ref 8–23)
CO2: 27 mmol/L (ref 22–32)
Calcium: 9.6 mg/dL (ref 8.9–10.3)
Chloride: 96 mmol/L — ABNORMAL LOW (ref 98–111)
Creatinine, Ser: 0.87 mg/dL (ref 0.61–1.24)
GFR calc Af Amer: >60 mL/min (ref >60)
GFR calc non Af Amer: >60 mL/min (ref >60)
Glucose, Bld: 481 mg/dL — ABNORMAL HIGH (ref 70–99)
Potassium: 4.1 mmol/L (ref 3.5–5.1)
Sodium: 133 mmol/L — ABNORMAL LOW (ref 135–145)
Total Bilirubin: 0.6 mg/dL (ref 0.3–1.2)
Total Protein: 7.5 g/dL (ref 6.5–8.1)

## 2019-01-27 LAB — CBC
HCT: 45.5 % (ref 39.0–52.0)
Hemoglobin: 15.4 g/dL (ref 13.0–17.0)
MCH: 31.6 pg (ref 26.0–34.0)
MCHC: 33.8 g/dL (ref 30.0–36.0)
MCV: 93.4 fL (ref 80.0–100.0)
Platelets: 177 10*3/uL (ref 150–400)
RBC: 4.87 MIL/uL (ref 4.22–5.81)
RDW: 12.6 % (ref 11.5–15.5)
WBC: 6.3 10*3/uL (ref 4.0–10.5)
nRBC: 0 % (ref 0.0–0.2)

## 2019-01-27 LAB — URINALYSIS, ROUTINE W REFLEX MICROSCOPIC
Bacteria, UA: NONE SEEN
Bilirubin Urine: NEGATIVE
Glucose, UA: >=500 mg/dL — AB
Hgb urine dipstick: NEGATIVE
Ketones, ur: NEGATIVE mg/dL
Leukocytes,Ua: NEGATIVE
Nitrite: NEGATIVE
Protein, ur: NEGATIVE mg/dL
Specific Gravity, Urine: 1.031 — ABNORMAL HIGH (ref 1.005–1.030)
pH: 5 (ref 5.0–8.0)

## 2019-01-27 LAB — DIFFERENTIAL
Abs Immature Granulocytes: 0.01 10*3/uL (ref 0.00–0.07)
Basophils Absolute: 0.1 10*3/uL (ref 0.0–0.1)
Basophils Relative: 1 %
Eosinophils Absolute: 0.2 10*3/uL (ref 0.0–0.5)
Eosinophils Relative: 3 %
Immature Granulocytes: 0 %
Lymphocytes Relative: 31 %
Lymphs Abs: 2 10*3/uL (ref 0.7–4.0)
Monocytes Absolute: 0.4 10*3/uL (ref 0.1–1.0)
Monocytes Relative: 7 %
Neutro Abs: 3.7 10*3/uL (ref 1.7–7.7)
Neutrophils Relative %: 58 %

## 2019-01-27 LAB — RAPID URINE DRUG SCREEN, HOSP PERFORMED
Amphetamines: NOT DETECTED
Barbiturates: NOT DETECTED
Benzodiazepines: NOT DETECTED
Cocaine: NOT DETECTED
Opiates: NOT DETECTED
Tetrahydrocannabinol: NOT DETECTED

## 2019-01-27 LAB — ETHANOL: Alcohol, Ethyl (B): <10 mg/dL (ref <10)

## 2019-01-27 LAB — PROTIME-INR
INR: 1.1 (ref 0.8–1.2)
Prothrombin Time: 13.6 seconds (ref 11.4–15.2)

## 2019-01-27 MED ORDER — ASPIRIN 81 MG PO CHEW
324.0000 mg | CHEWABLE_TABLET | Freq: Once | ORAL | Status: AC
  Administered 2019-01-27: 324 mg via ORAL
  Filled 2019-01-27: qty 4

## 2019-01-27 MED ORDER — ASPIRIN EC 325 MG PO TBEC: 325.0000 mg | DELAYED_RELEASE_TABLET | Freq: Every day | ORAL | 1 refills | Status: DC

## 2019-01-27 MED ORDER — METFORMIN HCL 500 MG PO TABS
500.0000 mg | ORAL_TABLET | Freq: Once | ORAL | Status: AC
  Administered 2019-01-27: 500 mg via ORAL
  Filled 2019-01-27: qty 1

## 2019-01-27 MED ORDER — METFORMIN HCL 500 MG PO TABS: 500.0000 mg | ORAL_TABLET | Freq: Two times a day (BID) | ORAL | 1 refills | Status: DC

## 2019-01-27 MED ORDER — BLOOD GLUCOSE MONITOR KIT: PACK | 0 refills | Status: DC

## 2019-01-27 NOTE — ED Notes (Signed)
Patient transported to MRI 

## 2019-01-27 NOTE — ED Notes (Signed)
Patient transported to CT 

## 2019-01-27 NOTE — ED Provider Notes (Signed)
[facility] EMERGENCY DEPARTMENT Provider Note   CSN: [id number] Arrival date & time: 01/27/19  1258    History   Chief Complaint Chief Complaint  Patient presents with  . Numbness    right hand    HPI Collin Yoder is a 67 y.o. male.     Pt presents to the ED today with right hand numbness.  The pt first noticed it Saturday, April 25th around 0900.  The pt is right handed and said that his right hand will no longer hold a pen, or do what it is supposed to do.  It feels numb.  He denies any other sx.  Pt does say that he has a hx of DM, but has not checked his blood sugar for awhile b/c he's been out of the strips.     Past Medical History:  Diagnosis Date  . Borderline diabetes   . Diabetes mellitus without complication ([place])   . Right lower lobe lung mass 04/11/2017    Patient Active Problem List   Diagnosis Date Noted  . Pulmonary nodule, right 12/04/2017  . Cough 12/04/2017  . Abnormal weight loss 12/04/2017  . Abdominal pain 12/04/2017  . Right lower lobe lung mass 04/11/2017    Past Surgical History:  Procedure Laterality Date  . APPENDECTOMY          Home Medications    Prior to Admission medications   Medication Sig Start Date End Date Taking? Authorizing Provider  Cholecalciferol (VITAMIN D3) 5000 units CAPS Take 1 capsule by mouth daily.   Yes [provider]  gabapentin (NEURONTIN) 100 MG capsule Take 2 capsules by mouth 3 (three) times daily as needed.  01/21/19  Yes [provider]  metFORMIN (GLUCOPHAGE) 500 MG tablet Take 500 mg by mouth 2 (two) times daily with a meal.   Yes [provider]  tetrahydrozoline 0.05 % ophthalmic solution Place 2 drops into both eyes daily as needed.   Yes [provider]  blood glucose meter kit and supplies KIT Dispense based on patient and insurance preference. Use up to four times daily as directed. (FOR ICD-9 250.00, 250.01). 01/27/19   Isla Pence, MD    Family  History Family History  Problem Relation Age of Onset  . Diabetes Mother 36  . Stroke Mother   . Colon cancer Neg Hx     Social History Social History   Tobacco Use  . Smoking status: Current Every Day Smoker    Packs/day: 1.00    Years: 40.00    Pack years: 40.00    Types: Cigarettes  . Smokeless tobacco: Never Used  Substance Use Topics  . Alcohol use: Yes    Alcohol/week: 1.0 standard drinks    Types: 1 Cans of beer per week    Comment: every 3 days  . Drug use: No     Allergies   Patient has no known allergies.   Review of Systems Review of Systems  Neurological: Positive for numbness.  All other systems reviewed and are negative.    Physical Exam Updated Vital Signs BP (!) 143/89   Pulse 75   Temp 98.6 F (37 C) (Oral)   Resp 18   Ht 5' 9"  (1.753 m)   Wt 77.1 kg   SpO2 99%   BMI 25.10 kg/m   Physical Exam Vitals signs and nursing note reviewed.  Constitutional:      Appearance: Normal appearance.  HENT:     Head: Normocephalic and atraumatic.  Right Ear: External ear normal.     Left Ear: External ear normal.     Nose: Nose normal.     Mouth/Throat:     Mouth: Mucous membranes are moist.  Eyes:     Extraocular Movements: Extraocular movements intact.     Conjunctiva/sclera: Conjunctivae normal.     Pupils: Pupils are equal, round, and reactive to light.  Neck:     Musculoskeletal: Normal range of motion and neck supple.  Cardiovascular:     Rate and Rhythm: Normal rate and regular rhythm.     Pulses: Normal pulses.     Heart sounds: Normal heart sounds.  Pulmonary:     Effort: Pulmonary effort is normal.     Breath sounds: Normal breath sounds.  Abdominal:     General: Abdomen is flat. Bowel sounds are normal.     Palpations: Abdomen is soft.  Musculoskeletal: Normal range of motion.  Skin:    General: Skin is warm.     Capillary Refill: Capillary refill takes less than 2 seconds.  Neurological:     General: No focal deficit  present.     Mental Status: He is alert and oriented to person, place, and time.     Comments: Right hand numbness.  Decreased fine motor ability.      ED Treatments / Results  Labs (all labs ordered are listed, but only abnormal results are displayed) Labs Reviewed  COMPREHENSIVE METABOLIC PANEL - Abnormal; Notable for the following components:      Result Value   Sodium 133 (*)    Chloride 96 (*)    Glucose, Bld 481 (*)    AST 14 (*)    All other components within normal limits  URINALYSIS, ROUTINE W REFLEX MICROSCOPIC - Abnormal; Notable for the following components:   Specific Gravity, Urine 1.031 (*)    Glucose, UA >=500 (*)    All other components within normal limits  CBG MONITORING, ED - Abnormal; Notable for the following components:   Glucose-Capillary 523 (*)    All other components within normal limits  ETHANOL  PROTIME-INR  APTT  CBC  DIFFERENTIAL  RAPID URINE DRUG SCREEN, HOSP PERFORMED    EKG EKG Interpretation  Date/Time:  Monday January 27 2019 13:18:13 EDT Ventricular Rate:  93 PR Interval:    QRS Duration: 94 QT Interval:  369 QTC Calculation: 459 R Axis:   -29 Text Interpretation:  Sinus rhythm Borderline left axis deviation Baseline wander in lead(s) V5 Confirmed by Isla Pence [phone]) on 01/27/2019 1:42:52 PM   Radiology Ct Head Wo Contrast  Result Date: 01/27/2019 CLINICAL DATA:  Right hand numbness. EXAM: CT HEAD WITHOUT CONTRAST TECHNIQUE: Contiguous axial images were obtained from the base of the skull through the vertex without intravenous contrast. COMPARISON:  None. FINDINGS: Brain: Mild chronic ischemic white matter disease is noted. Old infarction is seen in left thalamus. No mass effect or midline shift is noted. Ventricular size is within normal limits. There is no evidence of mass lesion, hemorrhage or acute infarction. Vascular: No hyperdense vessel or unexpected calcification. Skull: Normal. Negative for fracture or focal lesion.  Sinuses/Orbits: Mild left maxillary sinusitis is noted. Other: None. IMPRESSION: Mild chronic ischemic white matter disease. Mild left maxillary sinusitis. No acute intracranial abnormality seen. Electronically Signed   By: Marijo Conception M.D.   On: 01/27/2019 14:34    Procedures Procedures (including critical care time)  Medications Ordered in ED Medications - No data to display   Initial  Impression / Assessment and Plan / ED Course  I have reviewed the triage vital signs and the nursing notes.  Pertinent labs & imaging results that were available during my care of the patient were reviewed by me and considered in my medical decision making (see chart for details).       Pt's blood sugar is elevated, but he said he took his meds prior to coming into the ED.    Pt's CT ok, so MRI ordered.  Pt signed out to Dr. Regenia Skeeter pending MRI result.   Final Clinical Impressions(s) / ED Diagnoses   Final diagnoses:  Numbness and tingling in right hand  Poorly controlled type 2 diabetes mellitus [facility])    ED Discharge Orders         Ordered    blood glucose meter kit and supplies KIT     01/27/19 1459           Isla Pence, MD 01/27/19 1542

## 2019-01-27 NOTE — Discharge Instructions (Signed)
Your MRI today shows a stroke.  It is very important to start the aspirin prescribed and to better control your diabetes.  We are prescribing the metformin for you today.  You will need to follow-up with a neurologist and your primary care physician.  If at any point you develop new or worsening symptoms such as severe headache, trouble speaking or swallowing, facial droop, weakness or numbness in your arms or legs, dizziness or trouble with balance, or any other new/concerning symptoms then return to the ER or call 911 for evaluation.

## 2019-01-27 NOTE — ED Provider Notes (Signed)
Care transferred to me.  MRI confirms left thalamic stroke.  MRA without acute findings.  Discussed with Dr. Merlene Laughter.  Patient does not want to be admitted and just wants to be treated as an outpatient.  Neurology feels this is fine given the small nature of the stroke.  Patient was given IV fluids by previous care team for the hyperglycemia.  He will be started on metformin as he states he has not taken this in a long time.  Will start on full dose aspirin per neuro recs.  Otherwise, we discussed strict return precautions.  Results for orders placed or performed during the hospital encounter of 01/27/19  Ethanol  Result Value Ref Range   Alcohol, Ethyl (B) <10 <10 mg/dL  Protime-INR  Result Value Ref Range   Prothrombin Time 13.6 11.4 - 15.2 seconds   INR 1.1 0.8 - 1.2  APTT  Result Value Ref Range   aPTT 29 24 - 36 seconds  CBC  Result Value Ref Range   WBC 6.3 4.0 - 10.5 K/uL   RBC 4.87 4.22 - 5.81 MIL/uL   Hemoglobin 15.4 13.0 - 17.0 g/dL   HCT 45.5 39.0 - 52.0 %   MCV 93.4 80.0 - 100.0 fL   MCH 31.6 26.0 - 34.0 pg   MCHC 33.8 30.0 - 36.0 g/dL   RDW 12.6 11.5 - 15.5 %   Platelets 177 150 - 400 K/uL   nRBC 0.0 0.0 - 0.2 %  Differential  Result Value Ref Range   Neutrophils Relative % 58 %   Neutro Abs 3.7 1.7 - 7.7 K/uL   Lymphocytes Relative 31 %   Lymphs Abs 2.0 0.7 - 4.0 K/uL   Monocytes Relative 7 %   Monocytes Absolute 0.4 0.1 - 1.0 K/uL   Eosinophils Relative 3 %   Eosinophils Absolute 0.2 0.0 - 0.5 K/uL   Basophils Relative 1 %   Basophils Absolute 0.1 0.0 - 0.1 K/uL   Immature Granulocytes 0 %   Abs Immature Granulocytes 0.01 0.00 - 0.07 K/uL  Comprehensive metabolic panel  Result Value Ref Range   Sodium 133 (L) 135 - 145 mmol/L   Potassium 4.1 3.5 - 5.1 mmol/L   Chloride 96 (L) 98 - 111 mmol/L   CO2 27 22 - 32 mmol/L   Glucose, Bld 481 (H) 70 - 99 mg/dL   BUN 11 8 - 23 mg/dL   Creatinine, Ser 0.87 0.61 - 1.24 mg/dL   Calcium 9.6 8.9 - 10.3 mg/dL   Total  Protein 7.5 6.5 - 8.1 g/dL   Albumin 4.3 3.5 - 5.0 g/dL   AST 14 (L) 15 - 41 U/L   ALT 13 0 - 44 U/L   Alkaline Phosphatase 74 38 - 126 U/L   Total Bilirubin 0.6 0.3 - 1.2 mg/dL   GFR calc non Af Amer >60 >60 mL/min   GFR calc Af Amer >60 >60 mL/min   Anion gap 10 5 - 15  Urine rapid drug screen (hosp performed)  Result Value Ref Range   Opiates NONE DETECTED NONE DETECTED   Cocaine NONE DETECTED NONE DETECTED   Benzodiazepines NONE DETECTED NONE DETECTED   Amphetamines NONE DETECTED NONE DETECTED   Tetrahydrocannabinol NONE DETECTED NONE DETECTED   Barbiturates NONE DETECTED NONE DETECTED  Urinalysis, Routine w reflex microscopic  Result Value Ref Range   Color, Urine YELLOW YELLOW   APPearance CLEAR CLEAR   Specific Gravity, Urine 1.031 (H) 1.005 - 1.030   pH 5.0 5.0 -  8.0   Glucose, UA >=500 (A) NEGATIVE mg/dL   Hgb urine dipstick NEGATIVE NEGATIVE   Bilirubin Urine NEGATIVE NEGATIVE   Ketones, ur NEGATIVE NEGATIVE mg/dL   Protein, ur NEGATIVE NEGATIVE mg/dL   Nitrite NEGATIVE NEGATIVE   Leukocytes,Ua NEGATIVE NEGATIVE   RBC / HPF 0-5 0 - 5 RBC/hpf   WBC, UA 0-5 0 - 5 WBC/hpf   Bacteria, UA NONE SEEN NONE SEEN  CBG monitoring, ED  Result Value Ref Range   Glucose-Capillary 523 (HH) 70 - 99 mg/dL   Comment 1 Notify RN    Ct Head Wo Contrast  Result Date: 01/27/2019 CLINICAL DATA:  Right hand numbness. EXAM: CT HEAD WITHOUT CONTRAST TECHNIQUE: Contiguous axial images were obtained from the base of the skull through the vertex without intravenous contrast. COMPARISON:  None. FINDINGS: Brain: Mild chronic ischemic white matter disease is noted. Old infarction is seen in left thalamus. No mass effect or midline shift is noted. Ventricular size is within normal limits. There is no evidence of mass lesion, hemorrhage or acute infarction. Vascular: No hyperdense vessel or unexpected calcification. Skull: Normal. Negative for fracture or focal lesion. Sinuses/Orbits: Mild left  maxillary sinusitis is noted. Other: None. IMPRESSION: Mild chronic ischemic white matter disease. Mild left maxillary sinusitis. No acute intracranial abnormality seen. Electronically Signed   By: Marijo Conception M.D.   On: 01/27/2019 14:34   Mr Jodene Nam Head Wo Contrast  Result Date: 01/27/2019 CLINICAL DATA:  Right hand numbness over the last 2 days EXAM: MRI HEAD WITHOUT CONTRAST MRA HEAD WITHOUT CONTRAST TECHNIQUE: Multiplanar, multiecho pulse sequences of the brain and surrounding structures were obtained without intravenous contrast. Angiographic images of the head were obtained using MRA technique without contrast. COMPARISON:  CT same day.  MRI 04/18/2017 FINDINGS: MRI HEAD FINDINGS Brain: 1 cm acute infarction in the left thalamus. No other acute finding. Elsewhere, there are minimal chronic small-vessel changes of pons. No focal cerebellar insult. Cerebral hemispheres show mild volume loss but without prior infarction. No mass lesion, hemorrhage, hydrocephalus or extra-axial collection. Vascular: Major vessels at the base of the brain show flow. Skull and upper cervical spine: Negative Sinuses/Orbits: Clear/normal Other: None MRA HEAD FINDINGS Both internal carotid arteries are widely patent into the brain. No siphon stenosis. The anterior and middle cerebral vessels are patent without proximal stenosis, aneurysm or vascular malformation. Both vertebral arteries are widely patent to the basilar. No basilar stenosis. Posterior circulation branch vessels appear normal. IMPRESSION: 1 cm acute infarction in the left thalamus. Otherwise negative brain MRI. Normal intracranial MR angiography of the large and medium size vessels. Electronically Signed   By: Nelson Chimes M.D.   On: 01/27/2019 16:13   Mr Brain Wo Contrast  Result Date: 01/27/2019 CLINICAL DATA:  Right hand numbness over the last 2 days EXAM: MRI HEAD WITHOUT CONTRAST MRA HEAD WITHOUT CONTRAST TECHNIQUE: Multiplanar, multiecho pulse sequences of  the brain and surrounding structures were obtained without intravenous contrast. Angiographic images of the head were obtained using MRA technique without contrast. COMPARISON:  CT same day.  MRI 04/18/2017 FINDINGS: MRI HEAD FINDINGS Brain: 1 cm acute infarction in the left thalamus. No other acute finding. Elsewhere, there are minimal chronic small-vessel changes of pons. No focal cerebellar insult. Cerebral hemispheres show mild volume loss but without prior infarction. No mass lesion, hemorrhage, hydrocephalus or extra-axial collection. Vascular: Major vessels at the base of the brain show flow. Skull and upper cervical spine: Negative Sinuses/Orbits: Clear/normal Other: None MRA HEAD FINDINGS  Both internal carotid arteries are widely patent into the brain. No siphon stenosis. The anterior and middle cerebral vessels are patent without proximal stenosis, aneurysm or vascular malformation. Both vertebral arteries are widely patent to the basilar. No basilar stenosis. Posterior circulation branch vessels appear normal. IMPRESSION: 1 cm acute infarction in the left thalamus. Otherwise negative brain MRI. Normal intracranial MR angiography of the large and medium size vessels. Electronically Signed   By: Nelson Chimes M.D.   On: 01/27/2019 16:13      Sherwood Gambler, MD 01/27/19 [phone]

## 2019-01-27 NOTE — ED Triage Notes (Signed)
Right hand numbness for 2 days.

## 2019-02-03 DIAGNOSIS — I6389 Other cerebral infarction: Secondary | ICD-10-CM | POA: Diagnosis not present

## 2019-02-03 DIAGNOSIS — E11610 Type 2 diabetes mellitus with diabetic neuropathic arthropathy: Secondary | ICD-10-CM | POA: Diagnosis not present

## 2019-03-03 DIAGNOSIS — I69393 Ataxia following cerebral infarction: Secondary | ICD-10-CM | POA: Diagnosis not present

## 2019-03-03 DIAGNOSIS — E559 Vitamin D deficiency, unspecified: Secondary | ICD-10-CM | POA: Diagnosis not present

## 2019-03-03 DIAGNOSIS — I69398 Other sequelae of cerebral infarction: Secondary | ICD-10-CM | POA: Diagnosis not present

## 2019-03-03 DIAGNOSIS — E1142 Type 2 diabetes mellitus with diabetic polyneuropathy: Secondary | ICD-10-CM | POA: Diagnosis not present

## 2019-03-04 DIAGNOSIS — I6389 Other cerebral infarction: Secondary | ICD-10-CM | POA: Diagnosis not present

## 2019-03-04 DIAGNOSIS — E11610 Type 2 diabetes mellitus with diabetic neuropathic arthropathy: Secondary | ICD-10-CM | POA: Diagnosis not present

## 2019-03-04 DIAGNOSIS — R5383 Other fatigue: Secondary | ICD-10-CM | POA: Diagnosis not present

## 2019-03-04 DIAGNOSIS — E559 Vitamin D deficiency, unspecified: Secondary | ICD-10-CM | POA: Diagnosis not present

## 2019-03-04 DIAGNOSIS — N401 Enlarged prostate with lower urinary tract symptoms: Secondary | ICD-10-CM | POA: Diagnosis not present

## 2019-03-04 DIAGNOSIS — R6882 Decreased libido: Secondary | ICD-10-CM | POA: Diagnosis not present

## 2019-03-05 ENCOUNTER — Other Ambulatory Visit: Payer: Self-pay | Admitting: Neurology

## 2019-03-05 DIAGNOSIS — I63032 Cerebral infarction due to thrombosis of left carotid artery: Secondary | ICD-10-CM

## 2019-05-07 IMAGING — CT CT CHEST W/ CM
2 of 5 series · 13 of 36 positions shown, 16 images · IV contrast (Isovue)
Comparison: Chest radiographs obtained earlier today.

CLINICAL DATA: Chronic right leg pain. 30 pound weight loss in the
past 2 months. Smoker. Clinical concern for malignancy.

EXAM:
CT CHEST, ABDOMEN, AND PELVIS WITH CONTRAST
TECHNIQUE: Multidetector CT imaging of the chest, abdomen and pelvis was
performed following the standard protocol during bolus
administration of intravenous contrast.
CONTRAST:  100mL NU25BM-2XX IOPAMIDOL (NU25BM-2XX) INJECTION 61%

[Series 2: cap with · axial · 0.72mm/px · z∈[+832,+1342]mm · 10 of 126 slices shown, 13 images]
[im 12/126  mediastinal]
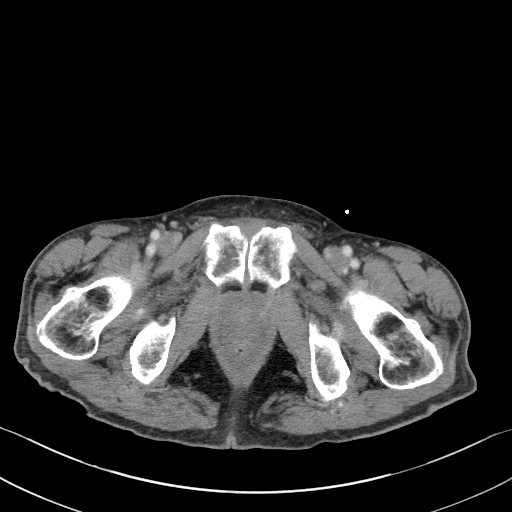
[im 12/126  lung]
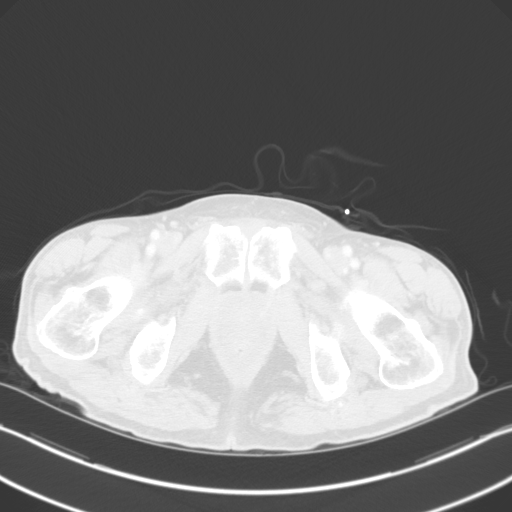
[im 23/126  lung]
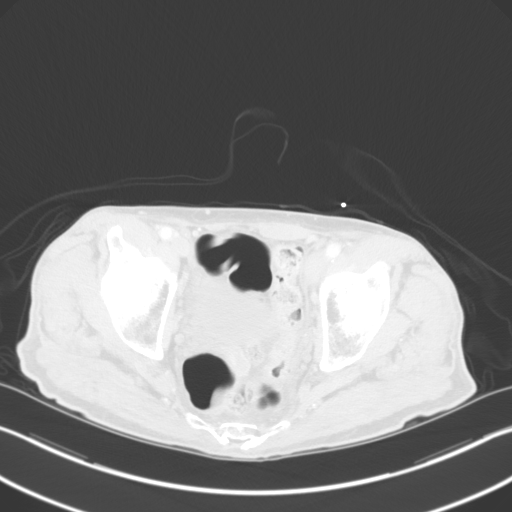
[im 35/126  lung]
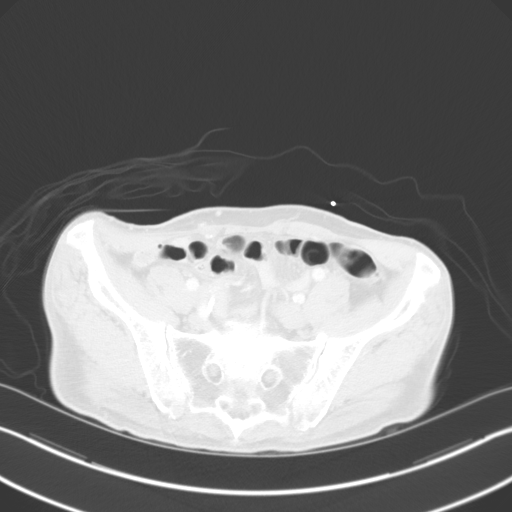
[im 46/126  lung]
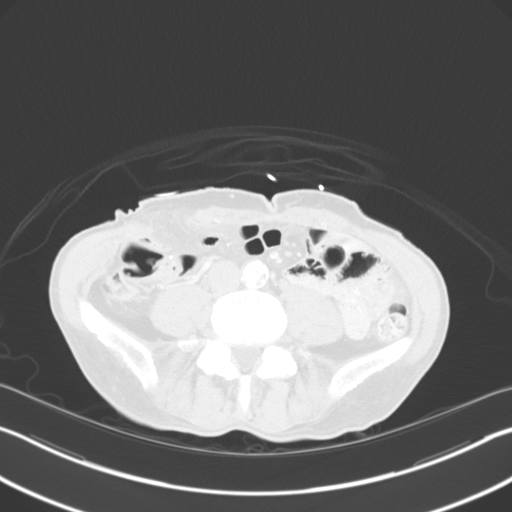
[im 57/126  mediastinal]
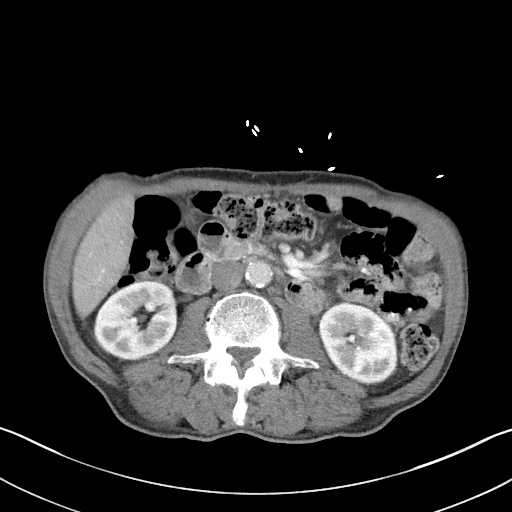
[im 57/126  lung]
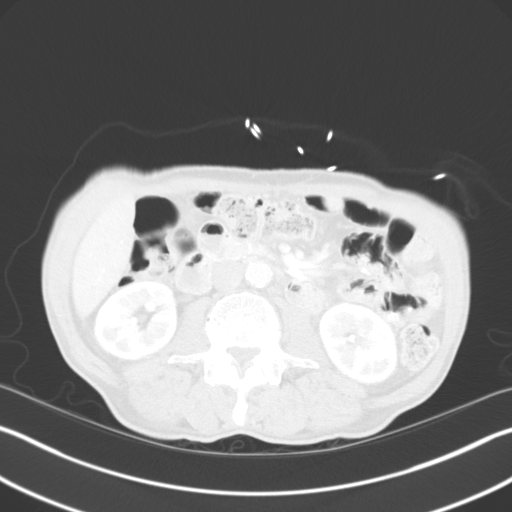
[im 69/126  lung]
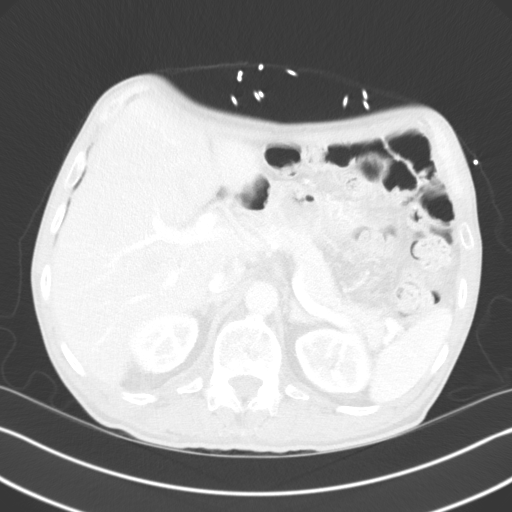
[im 80/126  lung]
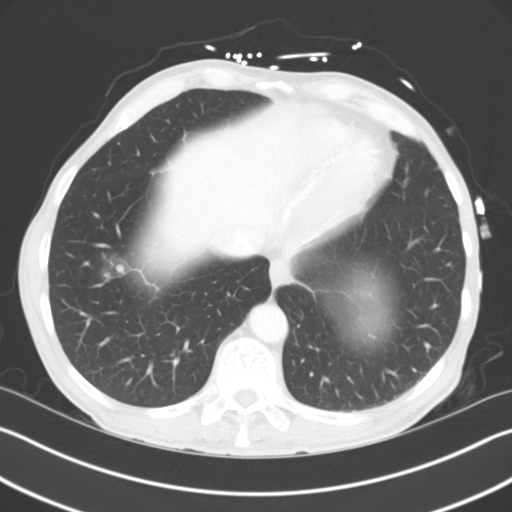
[im 91/126  lung]
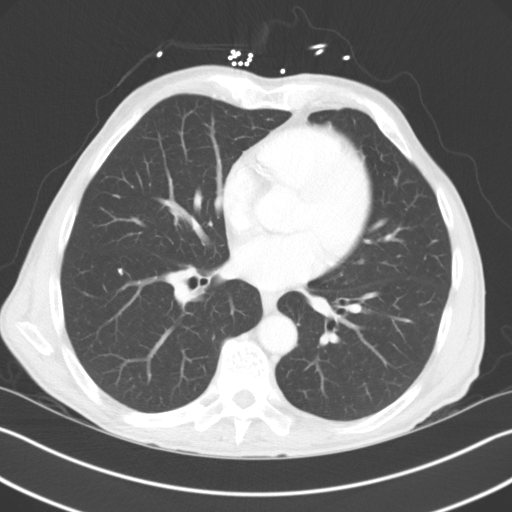
[im 103/126  mediastinal]
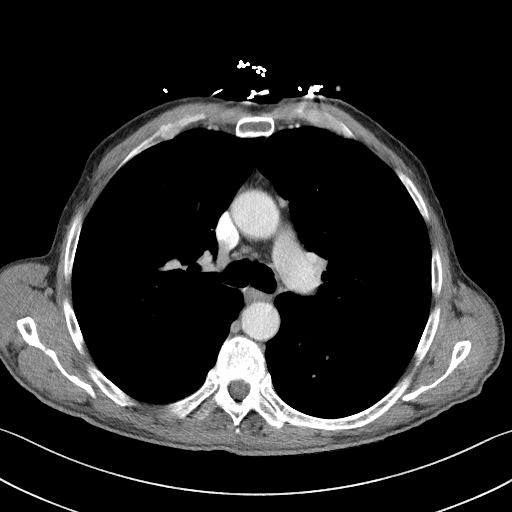
[im 103/126  lung]
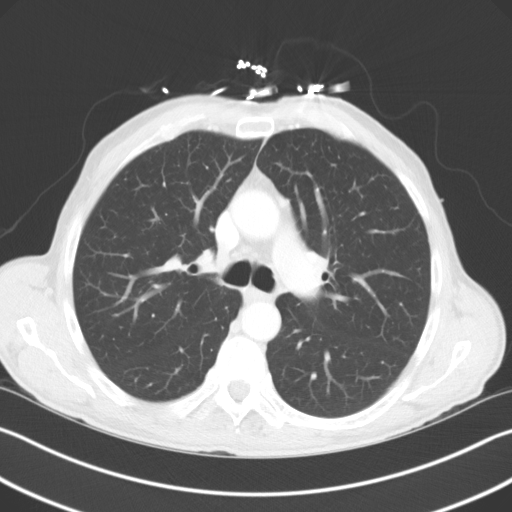
[im 114/126  lung]
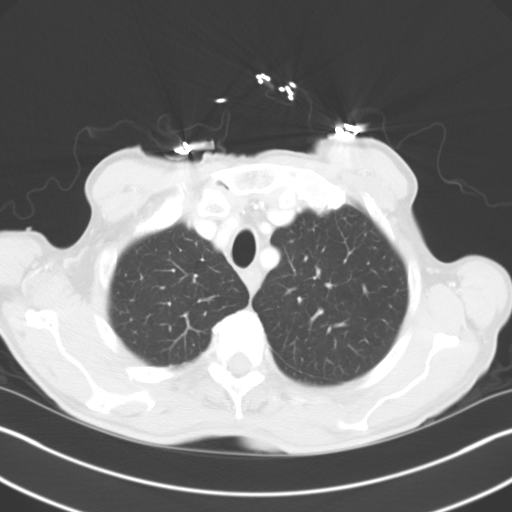

[Series 4: coronals · coronal · 0.69mm/px · 3 of 141 slices shown]
[im 29/141  lung]
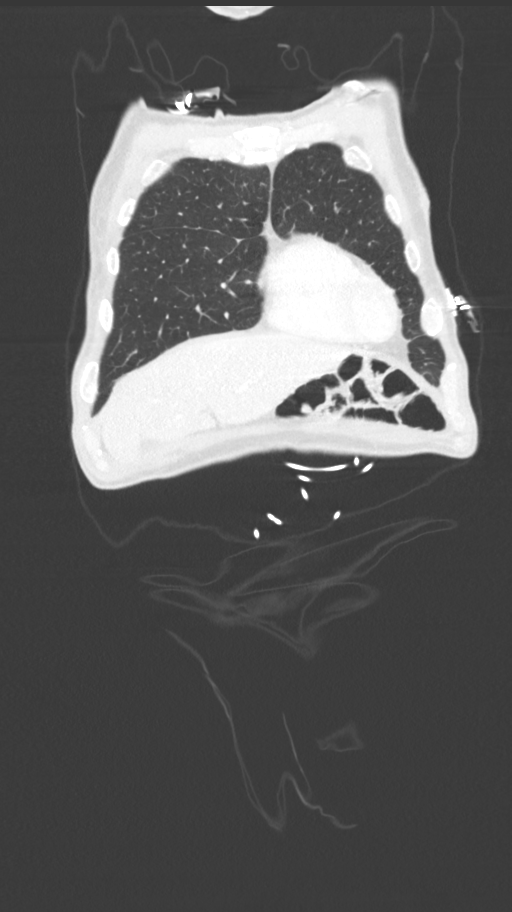
[im 57/141  lung]
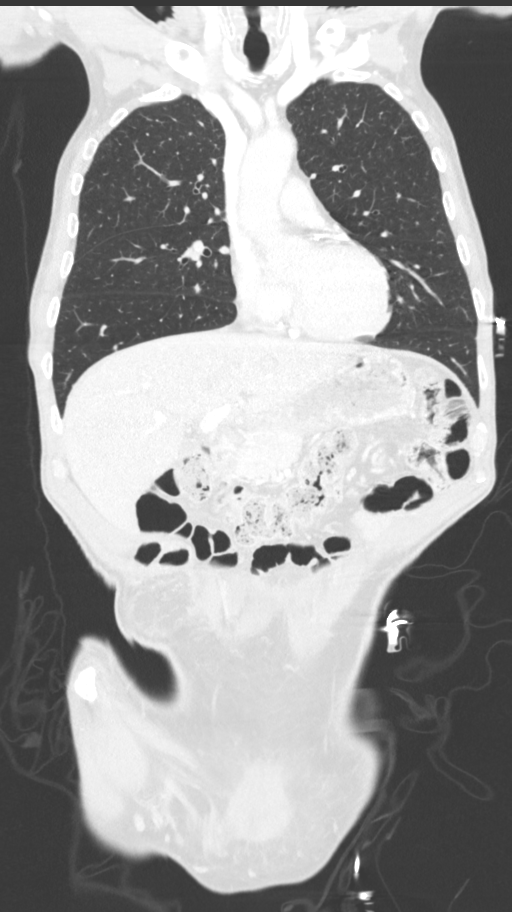
[im 85/141  lung]
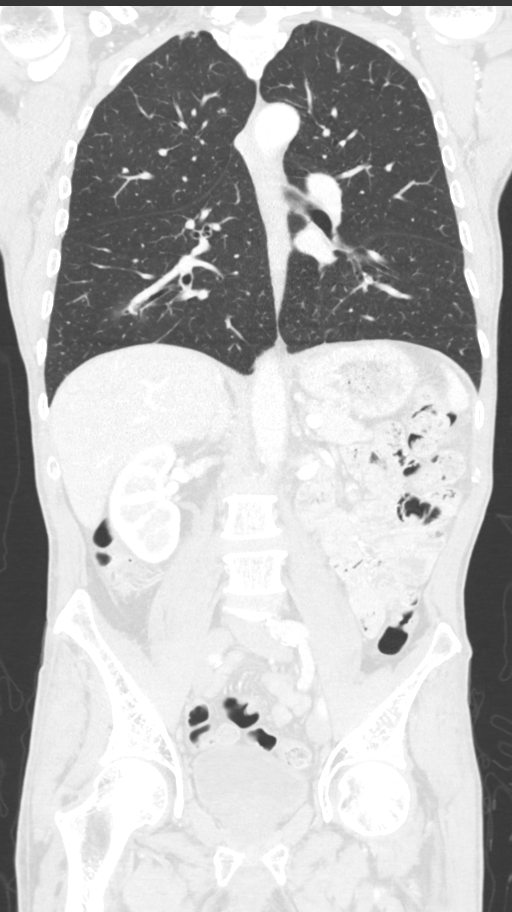

[13 of 36 positions shown; findings below may reference images not displayed]

FINDINGS: CT CHEST FINDINGS

Cardiovascular: Atheromatous arterial calcifications, including
dense coronary artery and aortic calcifications. Normal sized heart.

Mediastinum/Nodes: No enlarged mediastinal, hilar, or axillary lymph
nodes. Thyroid gland, trachea, and esophagus demonstrate no
significant findings.

Lungs/Pleura: Patchy and confluent opacity with air bronchograms in
the right lower lobe, measuring 4.3 x 3.2 cm on image number 105 of
series 3. This measures 2.4 cm in length on sagittal image number
132. The remainder of the lungs are clear. Mild hyperexpansion of
the lungs with mild diffuse peribronchial thickening.

Musculoskeletal: Thoracic spine degenerative changes.

CT ABDOMEN PELVIS FINDINGS

Hepatobiliary: No focal liver abnormality is seen. No gallstones,
gallbladder wall thickening, or biliary dilatation.

Pancreas: Unremarkable. No pancreatic ductal dilatation or
surrounding inflammatory changes.

Spleen: Normal in size without focal abnormality.

Adrenals/Urinary Tract: Tiny, partially exophytic lower pole right
renal cyst. Otherwise, normal appearing kidneys, ureters and urinary
bladder. Normal appearing adrenal glands.

Stomach/Bowel: Surgically absent appendix. Normal appearing stomach,
small bowel and colon.

Vascular/Lymphatic: Atheromatous arterial calcifications and plaques
with no aneurysm. No enlarged lymph nodes.

Reproductive: Moderately enlarged prostate gland.

Other: Small bilateral inguinal hernias containing fat.

Musculoskeletal: Approximately 20% L1 superior endplate compression
deformity an approximately 30% L2 superior endplate compression
deformity and Schmorl's node formation. No significant bony
retropulsion or canal stenosis. No acute fracture lines seen. Mild
multilevel degenerative changes in the lumbar spine.
IMPRESSION: 1. 4.3 x 3.2 x 2.4 cm patchy and confluent opacity with air
bronchograms in the right lower lobe. In the absence of symptoms of
pneumonia, this is concerning for lung adenocarcinoma.
2. Mild changes of COPD and chronic bronchitis.
3. Dense, calcified coronary artery and aortic atherosclerosis.
Aortic Atherosclerosis ([id number]-[id number].R) and Emphysema ([id number].T).

## 2019-06-05 ENCOUNTER — Ambulatory Visit (INDEPENDENT_AMBULATORY_CARE_PROVIDER_SITE_OTHER): Payer: Medicare HMO | Admitting: Internal Medicine

## 2019-07-03 ENCOUNTER — Other Ambulatory Visit (INDEPENDENT_AMBULATORY_CARE_PROVIDER_SITE_OTHER): Payer: Self-pay | Admitting: Internal Medicine

## 2019-07-15 ENCOUNTER — Ambulatory Visit (INDEPENDENT_AMBULATORY_CARE_PROVIDER_SITE_OTHER): Payer: Medicare HMO | Admitting: Internal Medicine

## 2019-07-15 ENCOUNTER — Encounter (INDEPENDENT_AMBULATORY_CARE_PROVIDER_SITE_OTHER): Payer: Self-pay | Admitting: Internal Medicine

## 2019-07-15 ENCOUNTER — Other Ambulatory Visit: Payer: Self-pay

## 2019-07-15 VITALS — BP 136/80 | HR 72 | Ht 69.0 in | Wt 172.6 lb

## 2019-07-15 DIAGNOSIS — Z1159 Encounter for screening for other viral diseases: Secondary | ICD-10-CM | POA: Diagnosis not present

## 2019-07-15 DIAGNOSIS — IMO0002 Reserved for concepts with insufficient information to code with codable children: Secondary | ICD-10-CM

## 2019-07-15 DIAGNOSIS — E119 Type 2 diabetes mellitus without complications: Secondary | ICD-10-CM | POA: Insufficient documentation

## 2019-07-15 DIAGNOSIS — G63 Polyneuropathy in diseases classified elsewhere: Secondary | ICD-10-CM

## 2019-07-15 DIAGNOSIS — E559 Vitamin D deficiency, unspecified: Secondary | ICD-10-CM | POA: Diagnosis not present

## 2019-07-15 DIAGNOSIS — Z23 Encounter for immunization: Secondary | ICD-10-CM | POA: Diagnosis not present

## 2019-07-15 DIAGNOSIS — E1165 Type 2 diabetes mellitus with hyperglycemia: Secondary | ICD-10-CM | POA: Insufficient documentation

## 2019-07-15 DIAGNOSIS — I639 Cerebral infarction, unspecified: Secondary | ICD-10-CM

## 2019-07-15 DIAGNOSIS — I6381 Other cerebral infarction due to occlusion or stenosis of small artery: Secondary | ICD-10-CM

## 2019-07-15 DIAGNOSIS — G629 Polyneuropathy, unspecified: Secondary | ICD-10-CM

## 2019-07-15 HISTORY — DX: Reserved for concepts with insufficient information to code with codable children: IMO0002

## 2019-07-15 HISTORY — DX: Polyneuropathy, unspecified: G62.9

## 2019-07-15 HISTORY — DX: Vitamin D deficiency, unspecified: E55.9

## 2019-07-15 HISTORY — DX: Type 2 diabetes mellitus with hyperglycemia: E11.65

## 2019-07-15 HISTORY — DX: Cerebral infarction, unspecified: I63.9

## 2019-07-15 HISTORY — DX: Other cerebral infarction due to occlusion or stenosis of small artery: I63.81

## 2019-07-15 MED ORDER — METFORMIN HCL 500 MG PO TABS: 500.0000 mg | ORAL_TABLET | Freq: Two times a day (BID) | ORAL | 0 refills | Status: DC

## 2019-07-15 MED ORDER — GABAPENTIN 100 MG PO CAPS: 200.0000 mg | ORAL_CAPSULE | Freq: Three times a day (TID) | ORAL | 0 refills | Status: DC

## 2019-07-15 NOTE — Progress Notes (Signed)
Wellness Office Visit  Subjective:  Patient ID: Collin Yoder, male    DOB: [birth year]  Age: 68 y.o. MRN: [id number]  CC: This man comes in for follow-up of uncontrolled diabetes, peripheral neuropathy, history of thalamic stroke and vitamin D deficiency. HPI  He is doing reasonably well but he has gained weight.  He says he wanted to gain weight which is somewhat unusual. He continues with metformin for his diabetes.  His last hemoglobin A1c was 9.8%.  He appears to already have peripheral neuropathy from his diabetes but gabapentin seems to have helped his symptoms. He also has vitamin D deficiency and he is taking vitamin D3 10,000 units daily without any problems. Thankfully, he has not any further symptoms of recurrent stroke. Past Medical History:  Diagnosis Date  . Borderline diabetes   . Peripheral neuropathy 07/15/2019  . Right lower lobe lung mass 04/11/2017  . Thalamic stroke ([place]) 07/15/2019  . Type II diabetes mellitus, uncontrolled ([place]) 07/15/2019  . Vitamin D deficiency disease 07/15/2019      Family History  Problem Relation Age of Onset  . Diabetes Mother 57  . Stroke Mother   . Colon cancer Neg Hx     Social History   Social History Narrative   Single.Lives alone in a motel.Works at [employer].     Current Meds  Medication Sig  . aspirin EC 325 MG tablet Take 1 tablet (325 mg total) by mouth daily.  . blood glucose meter kit and supplies KIT Dispense based on patient and insurance preference. Use up to four times daily as directed. (FOR ICD-9 250.00, 250.01).  . Cholecalciferol (VITAMIN D3) 5000 units CAPS Take 2 capsules by mouth daily.   Marland Kitchen gabapentin (NEURONTIN) 100 MG capsule Take 2 capsules (200 mg total) by mouth 3 (three) times daily.  . metFORMIN (GLUCOPHAGE) 500 MG tablet Take 1 tablet (500 mg total) by mouth 2 (two) times daily with a meal.  . [DISCONTINUED] gabapentin (NEURONTIN) 100 MG capsule TAKE (2) CAPSULES BY MOUTH (3)  TIMES DAILY.  . [DISCONTINUED] metFORMIN (GLUCOPHAGE) 500 MG tablet Take 1 tablet (500 mg total) by mouth 2 (two) times daily with a meal.  . [DISCONTINUED] metFORMIN (GLUCOPHAGE) 500 MG tablet Take 1 tablet (500 mg total) by mouth 2 (two) times daily with a meal.  . [DISCONTINUED] tetrahydrozoline 0.05 % ophthalmic solution Place 2 drops into both eyes daily as needed.       Objective:   Today's Vitals: BP 136/80   Pulse 72   Ht 5' 9"  (1.753 m)   Wt 172 lb 9.6 oz (78.3 kg)   BMI 25.49 kg/m  Vitals with BMI 07/15/2019 01/27/2019 01/27/2019  Height 5' 9"  - -  Weight 172 lbs 10 oz - -  BMI 65.53 - -  Systolic 748 270 786  Diastolic 80 90 69  Pulse 72 68 70     Physical Exam   He looks systemically well.  He has gained about 10 pounds in weight since the last time I saw him in the office.  Blood pressure is reasonably well controlled.  He is alert and orientated without any new focal neurological signs.    Assessment   1. Encounter for hepatitis C screening test for low risk patient   2. Vitamin D deficiency disease   3. Polyneuropathy associated with underlying disease ([place])   4. Thalamic stroke ([place])   5. Uncontrolled type 2 diabetes mellitus with hyperglycemia ([place])  Tests ordered Orders Placed This Encounter  Procedures  . COMPLETE METABOLIC PANEL WITH GFR  . Hemoglobin A1c  . Hepatitis C antibody  . VITAMIN D 25 Hydroxy (Vit-D Deficiency, Fractures)  . Microalbumin, urine     Plan: 1. He will continue with vitamin D3 supplementation for vitamin D deficiency. 2. He will continue with gabapentin for his peripheral neuropathy and this seems to be helping him with his symptoms. 3. He will continue with aspirin for previous history of stroke and hopefully prevention of further episodes. 4. He will continue with Metformin for his diabetes and we will see what his A1c is at this point.  Urine was also taken for microalbumin levels. 5. Blood work is ordered as  above. 6. I have refilled his gabapentin and Metformin today. 7. Further recommendations will depend on blood results and I will see him in about 3 months time for an annual physical exam.   Meds ordered this encounter  Medications  . gabapentin (NEURONTIN) 100 MG capsule    Sig: Take 2 capsules (200 mg total) by mouth 3 (three) times daily.    Dispense:  540 capsule    Refill:  0  . DISCONTD: metFORMIN (GLUCOPHAGE) 500 MG tablet    Sig: Take 1 tablet (500 mg total) by mouth 2 (two) times daily with a meal.    Dispense:  180 tablet    Refill:  0  . metFORMIN (GLUCOPHAGE) 500 MG tablet    Sig: Take 1 tablet (500 mg total) by mouth 2 (two) times daily with a meal.    Dispense:  180 tablet    Refill:  0     Luther Parody, MD

## 2019-07-16 ENCOUNTER — Encounter (INDEPENDENT_AMBULATORY_CARE_PROVIDER_SITE_OTHER): Payer: Self-pay | Admitting: Internal Medicine

## 2019-07-16 LAB — COMPLETE METABOLIC PANEL WITH GFR
AG Ratio: 1.6 (calc) (ref 1.0–2.5)
ALT: 9 U/L (ref 9–46)
AST: 9 U/L — ABNORMAL LOW (ref 10–35)
Albumin: 4.4 g/dL (ref 3.6–5.1)
Alkaline phosphatase (APISO): 64 U/L (ref 35–144)
BUN: 18 mg/dL (ref 7–25)
CO2: 30 mmol/L (ref 20–32)
Calcium: 10 mg/dL (ref 8.6–10.3)
Chloride: 98 mmol/L (ref 98–110)
Creat: 0.92 mg/dL (ref 0.70–1.18)
GFR, Est African American: 97 mL/min/{1.73_m2} (ref >=60)
GFR, Est Non African American: 84 mL/min/{1.73_m2} (ref >=60)
Globulin: 2.8 g/dL (calc) (ref 1.9–3.7)
Glucose, Bld: 262 mg/dL — ABNORMAL HIGH (ref 65–99)
Potassium: 4.5 mmol/L (ref 3.5–5.3)
Sodium: 135 mmol/L (ref 135–146)
Total Bilirubin: 0.5 mg/dL (ref 0.2–1.2)
Total Protein: 7.2 g/dL (ref 6.1–8.1)

## 2019-07-16 LAB — VITAMIN D 25 HYDROXY (VIT D DEFICIENCY, FRACTURES): Vit D, 25-Hydroxy: 47 ng/mL (ref 30–100)

## 2019-07-16 LAB — MICROALBUMIN, URINE: Microalb, Ur: 1.3 mg/dL

## 2019-07-16 LAB — HEMOGLOBIN A1C
Hgb A1c MFr Bld: 8.1 % of total Hgb — ABNORMAL HIGH (ref <5.7)
Mean Plasma Glucose: 186 (calc)
eAG (mmol/L): 10.3 (calc)

## 2019-07-16 LAB — HEPATITIS C ANTIBODY
Hepatitis C Ab: NONREACTIVE
SIGNAL TO CUT-OFF: 0.01 (ref <1.00)

## 2019-10-21 ENCOUNTER — Other Ambulatory Visit (INDEPENDENT_AMBULATORY_CARE_PROVIDER_SITE_OTHER): Payer: Self-pay | Admitting: Nurse Practitioner

## 2019-10-21 ENCOUNTER — Encounter (INDEPENDENT_AMBULATORY_CARE_PROVIDER_SITE_OTHER): Payer: Medicare HMO | Admitting: Internal Medicine

## 2019-10-21 ENCOUNTER — Telehealth (INDEPENDENT_AMBULATORY_CARE_PROVIDER_SITE_OTHER): Payer: Self-pay | Admitting: Internal Medicine

## 2019-10-21 DIAGNOSIS — G63 Polyneuropathy in diseases classified elsewhere: Secondary | ICD-10-CM

## 2019-10-21 DIAGNOSIS — E1165 Type 2 diabetes mellitus with hyperglycemia: Secondary | ICD-10-CM

## 2019-10-21 MED ORDER — METFORMIN HCL 500 MG PO TABS: 500.0000 mg | ORAL_TABLET | Freq: Two times a day (BID) | ORAL | 0 refills | Status: DC

## 2019-10-21 MED ORDER — GABAPENTIN 100 MG PO CAPS: 200.0000 mg | ORAL_CAPSULE | Freq: Three times a day (TID) | ORAL | 0 refills | Status: DC

## 2019-10-21 NOTE — Telephone Encounter (Signed)
Refill for Metformin and gabapentin sent to patient's pharmacy.

## 2019-11-13 ENCOUNTER — Encounter (INDEPENDENT_AMBULATORY_CARE_PROVIDER_SITE_OTHER): Payer: Medicare HMO | Admitting: Internal Medicine

## 2020-01-28 ENCOUNTER — Other Ambulatory Visit (INDEPENDENT_AMBULATORY_CARE_PROVIDER_SITE_OTHER): Payer: Self-pay | Admitting: Internal Medicine

## 2020-01-28 ENCOUNTER — Telehealth (INDEPENDENT_AMBULATORY_CARE_PROVIDER_SITE_OTHER): Payer: Self-pay | Admitting: Internal Medicine

## 2020-01-28 DIAGNOSIS — G63 Polyneuropathy in diseases classified elsewhere: Secondary | ICD-10-CM

## 2020-01-28 DIAGNOSIS — E1165 Type 2 diabetes mellitus with hyperglycemia: Secondary | ICD-10-CM

## 2020-01-28 MED ORDER — GABAPENTIN 100 MG PO CAPS: 200.0000 mg | ORAL_CAPSULE | Freq: Three times a day (TID) | ORAL | 0 refills | Status: DC

## 2020-02-03 NOTE — Telephone Encounter (Signed)
Done

## 2020-02-04 ENCOUNTER — Other Ambulatory Visit: Payer: Self-pay

## 2020-02-04 ENCOUNTER — Ambulatory Visit: Payer: Medicare HMO | Attending: Internal Medicine

## 2020-02-04 DIAGNOSIS — Z20822 Contact with and (suspected) exposure to covid-19: Secondary | ICD-10-CM

## 2020-02-05 ENCOUNTER — Telehealth: Payer: Self-pay | Admitting: *Deleted

## 2020-02-05 LAB — SARS-COV-2, NAA 2 DAY TAT

## 2020-02-05 LAB — NOVEL CORONAVIRUS, NAA: SARS-CoV-2, NAA: NOT DETECTED

## 2020-02-05 NOTE — Telephone Encounter (Signed)
Patient notified of negative Covid-19 results. Understanding verbalized.

## 2020-02-23 ENCOUNTER — Other Ambulatory Visit: Payer: Self-pay

## 2020-02-23 ENCOUNTER — Ambulatory Visit (INDEPENDENT_AMBULATORY_CARE_PROVIDER_SITE_OTHER): Payer: Medicare HMO | Admitting: Nurse Practitioner

## 2020-02-23 ENCOUNTER — Encounter (INDEPENDENT_AMBULATORY_CARE_PROVIDER_SITE_OTHER): Payer: Self-pay | Admitting: Nurse Practitioner

## 2020-02-23 VITALS — BP 162/100 | HR 106 | Temp 99.7°F | Ht 68.0 in | Wt 172.6 lb

## 2020-02-23 DIAGNOSIS — I10 Essential (primary) hypertension: Secondary | ICD-10-CM

## 2020-02-23 DIAGNOSIS — Z0001 Encounter for general adult medical examination with abnormal findings: Secondary | ICD-10-CM

## 2020-02-23 DIAGNOSIS — Z8673 Personal history of transient ischemic attack (TIA), and cerebral infarction without residual deficits: Secondary | ICD-10-CM | POA: Diagnosis not present

## 2020-02-23 DIAGNOSIS — E559 Vitamin D deficiency, unspecified: Secondary | ICD-10-CM | POA: Diagnosis not present

## 2020-02-23 DIAGNOSIS — E1165 Type 2 diabetes mellitus with hyperglycemia: Secondary | ICD-10-CM

## 2020-02-23 NOTE — Progress Notes (Signed)
Subjective:  Patient ID: Collin Yoder, male    DOB: 1951/03/29  Age: 69 y.o. MRN: [id number]  CC:  Chief Complaint  Patient presents with  . Annual Exam      HPI  This patient has today for his annual physical exam.  He has no acute complaints or concerns today.  He does have a history of type 2 diabetes, CVA, and vitamin D deficiency.   Past Medical History:  Diagnosis Date  . Borderline diabetes   . Peripheral neuropathy 07/15/2019  . Right lower lobe lung mass 04/11/2017  . Thalamic stroke ([place]) 07/15/2019  . Type II diabetes mellitus, uncontrolled ([place]) 07/15/2019  . Vitamin D deficiency disease 07/15/2019      Family History  Problem Relation Age of Onset  . Diabetes Mother 62  . Stroke Mother   . Colon cancer Neg Hx     Social History   Social History Narrative   Single.Lives alone in a motel.Works at [employer].   Social History   Tobacco Use  . Smoking status: Current Every Day Smoker    Packs/day: 1.00    Years: 45.00    Pack years: 45.00    Types: Cigarettes  . Smokeless tobacco: Never Used  Substance Use Topics  . Alcohol use: Yes    Alcohol/week: 1.0 standard drinks    Types: 1 Cans of beer per week    Comment: every 3 days     Current Meds  Medication Sig  . aspirin EC 325 MG tablet Take 1 tablet (325 mg total) by mouth daily.  . blood glucose meter kit and supplies KIT Dispense based on patient and insurance preference. Use up to four times daily as directed. (FOR ICD-9 250.00, 250.01).  . Cholecalciferol (VITAMIN D3) 5000 units CAPS Take 2 capsules by mouth daily.   Marland Kitchen gabapentin (NEURONTIN) 100 MG capsule Take 2 capsules (200 mg total) by mouth 3 (three) times daily.  . metFORMIN (GLUCOPHAGE) 500 MG tablet Take 1 tablet (500 mg total) by mouth 2 (two) times daily with a meal.    ROS:  Review of Systems  Constitutional: Negative.   Eyes: Positive for blurred vision.  Respiratory: Negative.   Cardiovascular: Negative.    Neurological: Negative.      Objective:   Today's Vitals: BP (!) 162/100   Pulse (!) 106   Temp 99.7 F (37.6 C)   Ht 5' 8"  (1.727 m)   Wt 172 lb 9.6 oz (78.3 kg)   SpO2 93%   BMI 26.24 kg/m  Vitals with BMI 02/23/2020 02/23/2020 07/15/2019  Height - 5' 8"  5' 9"   Weight - 172 lbs 10 oz 172 lbs 10 oz  BMI - 82.42 35.36  Systolic 144 315 400  Diastolic 867 85 80  Pulse - 106 72     Physical Exam Vitals reviewed.  Constitutional:      General: He is not in acute distress.    Appearance: Normal appearance. He is not ill-appearing.  HENT:     Head: Normocephalic and atraumatic.     Right Ear: Tympanic membrane, ear canal and external ear normal.     Left Ear: Tympanic membrane, ear canal and external ear normal.  Eyes:     General: No scleral icterus.    Extraocular Movements: Extraocular movements intact.     Conjunctiva/sclera: Conjunctivae normal.     Pupils: Pupils are equal, round, and reactive to light.  Neck:     Vascular: No carotid  bruit.  Cardiovascular:     Rate and Rhythm: Normal rate and regular rhythm.     Pulses: Normal pulses.     Heart sounds: Normal heart sounds.  Pulmonary:     Effort: Pulmonary effort is normal.     Breath sounds: Normal breath sounds.  Abdominal:     General: Bowel sounds are normal. There is no distension.     Palpations: There is no mass.     Tenderness: There is no abdominal tenderness.     Hernia: No hernia is present.  Musculoskeletal:        General: No swelling or tenderness.     Cervical back: Normal range of motion and neck supple. No rigidity.  Lymphadenopathy:     Cervical: No cervical adenopathy.  Skin:    General: Skin is warm and dry.  Neurological:     General: No focal deficit present.     Mental Status: He is alert and oriented to person, place, and time.     Cranial Nerves: No cranial nerve deficit.     Sensory: No sensory deficit.     Motor: No weakness.     Gait: Gait normal.  Psychiatric:         Mood and Affect: Mood normal.        Behavior: Behavior normal.        Thought Content: Thought content normal.        Judgment: Judgment normal.          Assessment and Plan   1. Encounter for general adult medical examination with abnormal findings   2. Uncontrolled type 2 diabetes mellitus with hyperglycemia ([place])   3. Vitamin D deficiency disease   4. History of CVA (cerebrovascular accident)   5. Hypertension, unspecified type      Plan: 1.  We discussed health maintenance including his immunizations.  He is probably due for shingles vaccine and COVID-19 vaccine series.  He is tell me he would not like for shingles vaccine and he is going to consider getting the COVID-19 vaccines.  He tells me he thinks his last tetanus shot was 6 years ago thus he would be up-to-date with this at this time.  As far screenings go he would like to hold off on colon cancer screening for another year.  He thinks he had a colonoscopy completed approximately 5 years ago outside of the [facility] health system, but he is not sure when he was recommended to rescreen.  Continues to be a current tobacco smoker, does not want to have sexual transmitted infection screening completed today.  He had abdominal CT scan completed approximately 3 years ago with no abdominal aortic aneurysm was noted at that time.  Depression screening was negative today.  Fall screening is negative today.  Hepatitis C screening was already completed and was negative.  He would qualify for lung cancer screening as his pack year is approximately 67.5, however he would like to hold off on lung cancer screening with CT scan for now.  2.-4.  We will collect blood work today for further evaluation.  He will continue on his current medication regimen for his chronic conditions.  5.  Blood pressure is quite elevated today and continued to be elevated upon recheck.  He tells me he drank quite a bit of caffeine prior to coming to the office today,  and would like to have blood pressure rechecked at office visit.  He was instructed to avoid caffeine the day that  he returns to clinic for follow-up in approximately 1 month, if blood pressure still elevated consider starting him on antihypertensive regimen.  Based on the fact that he has a history of type 2 diabetes he probably should be on ACE or ARB, so I will discuss this with him at his next office visit.  Of note, his temperature was also elevated initially when he entered the office.  He did state that the car he drove to get to the office today was very hot.  Temperature recheck did show that his temperature is coming down.  Tests ordered Orders Placed This Encounter  Procedures  . CBC  . CMP with eGFR([facility])  . Lipid Panel  . TSH  . Vitamin D, 25-hydroxy  . Hemoglobin A1c      No orders of the defined types were placed in this encounter.   Patient to follow-up in 1 month  Ailene Ards, NP

## 2020-02-24 LAB — CBC
HCT: 46 % (ref 38.5–50.0)
Hemoglobin: 15.6 g/dL (ref 13.2–17.1)
MCH: 31.3 pg (ref 27.0–33.0)
MCHC: 33.9 g/dL (ref 32.0–36.0)
MCV: 92.2 fL (ref 80.0–100.0)
MPV: 11 fL (ref 7.5–12.5)
Platelets: 177 10*3/uL (ref 140–400)
RBC: 4.99 10*6/uL (ref 4.20–5.80)
RDW: 11.9 % (ref 11.0–15.0)
WBC: 7.8 10*3/uL (ref 3.8–10.8)

## 2020-02-24 LAB — HEMOGLOBIN A1C
Hgb A1c MFr Bld: 9.3 % of total Hgb — ABNORMAL HIGH (ref <5.7)
Mean Plasma Glucose: 220 (calc)
eAG (mmol/L): 12.2 (calc)

## 2020-02-24 LAB — COMPLETE METABOLIC PANEL WITH GFR
AG Ratio: 1.6 (calc) (ref 1.0–2.5)
ALT: 12 U/L (ref 9–46)
AST: 11 U/L (ref 10–35)
Albumin: 4.6 g/dL (ref 3.6–5.1)
Alkaline phosphatase (APISO): 55 U/L (ref 35–144)
BUN: 12 mg/dL (ref 7–25)
CO2: 29 mmol/L (ref 20–32)
Calcium: 10.4 mg/dL — ABNORMAL HIGH (ref 8.6–10.3)
Chloride: 101 mmol/L (ref 98–110)
Creat: 0.98 mg/dL (ref 0.70–1.18)
GFR, Est African American: 90 mL/min/{1.73_m2} (ref >=60)
GFR, Est Non African American: 78 mL/min/{1.73_m2} (ref >=60)
Globulin: 2.8 g/dL (calc) (ref 1.9–3.7)
Glucose, Bld: 149 mg/dL — ABNORMAL HIGH (ref 65–99)
Potassium: 4.4 mmol/L (ref 3.5–5.3)
Sodium: 137 mmol/L (ref 135–146)
Total Bilirubin: 0.7 mg/dL (ref 0.2–1.2)
Total Protein: 7.4 g/dL (ref 6.1–8.1)

## 2020-02-24 LAB — TSH: TSH: 3.54 mIU/L (ref 0.40–4.50)

## 2020-02-24 LAB — LIPID PANEL
Cholesterol: 170 mg/dL (ref <200)
HDL: 39 mg/dL — ABNORMAL LOW (ref >=40)
LDL Cholesterol (Calc): 106 mg/dL (calc) — ABNORMAL HIGH
Non-HDL Cholesterol (Calc): 131 mg/dL (calc) — ABNORMAL HIGH (ref <130)
Total CHOL/HDL Ratio: 4.4 (calc) (ref <5.0)
Triglycerides: 130 mg/dL (ref <150)

## 2020-02-24 LAB — VITAMIN D 25 HYDROXY (VIT D DEFICIENCY, FRACTURES): Vit D, 25-Hydroxy: 113 ng/mL — ABNORMAL HIGH (ref 30–100)

## 2020-04-06 ENCOUNTER — Ambulatory Visit (INDEPENDENT_AMBULATORY_CARE_PROVIDER_SITE_OTHER): Payer: Medicare HMO | Admitting: Nurse Practitioner

## 2020-04-29 ENCOUNTER — Other Ambulatory Visit (INDEPENDENT_AMBULATORY_CARE_PROVIDER_SITE_OTHER): Payer: Self-pay | Admitting: Internal Medicine

## 2020-04-29 DIAGNOSIS — G63 Polyneuropathy in diseases classified elsewhere: Secondary | ICD-10-CM

## 2020-04-29 DIAGNOSIS — E1165 Type 2 diabetes mellitus with hyperglycemia: Secondary | ICD-10-CM

## 2020-07-26 ENCOUNTER — Other Ambulatory Visit (INDEPENDENT_AMBULATORY_CARE_PROVIDER_SITE_OTHER): Payer: Self-pay | Admitting: Nurse Practitioner

## 2020-07-26 DIAGNOSIS — E1165 Type 2 diabetes mellitus with hyperglycemia: Secondary | ICD-10-CM

## 2020-07-26 DIAGNOSIS — G63 Polyneuropathy in diseases classified elsewhere: Secondary | ICD-10-CM

## 2020-08-03 ENCOUNTER — Ambulatory Visit (INDEPENDENT_AMBULATORY_CARE_PROVIDER_SITE_OTHER): Payer: Medicare HMO | Admitting: Nurse Practitioner

## 2020-08-03 ENCOUNTER — Encounter (INDEPENDENT_AMBULATORY_CARE_PROVIDER_SITE_OTHER): Payer: Self-pay | Admitting: Nurse Practitioner

## 2020-08-03 ENCOUNTER — Other Ambulatory Visit: Payer: Self-pay

## 2020-08-03 VITALS — BP 152/84 | HR 77 | Temp 97.3°F | Ht 68.0 in | Wt 173.8 lb

## 2020-08-03 DIAGNOSIS — Z23 Encounter for immunization: Secondary | ICD-10-CM

## 2020-08-03 DIAGNOSIS — E1165 Type 2 diabetes mellitus with hyperglycemia: Secondary | ICD-10-CM | POA: Diagnosis not present

## 2020-08-03 DIAGNOSIS — E785 Hyperlipidemia, unspecified: Secondary | ICD-10-CM | POA: Diagnosis not present

## 2020-08-03 DIAGNOSIS — F17210 Nicotine dependence, cigarettes, uncomplicated: Secondary | ICD-10-CM | POA: Diagnosis not present

## 2020-08-03 DIAGNOSIS — I10 Essential (primary) hypertension: Secondary | ICD-10-CM | POA: Insufficient documentation

## 2020-08-03 DIAGNOSIS — Z720 Tobacco use: Secondary | ICD-10-CM | POA: Insufficient documentation

## 2020-08-03 MED ORDER — LOSARTAN POTASSIUM 25 MG PO TABS: 25.0000 mg | ORAL_TABLET | Freq: Every day | ORAL | 0 refills | Status: DC

## 2020-08-03 MED ORDER — BLOOD GLUCOSE MONITOR KIT: PACK | 11 refills | Status: DC

## 2020-08-03 NOTE — Progress Notes (Signed)
Subjective:  Patient ID: Collin Yoder, male    DOB: May 22, 1951  Age: 69 y.o. MRN: [id number]  CC:  Chief Complaint  Patient presents with  . Follow-up    Doing okay  . Hyperlipidemia  . Hypertension  . Diabetes      HPI  This patient arrives today for the above.  Hyperlipidemia: Last of the panel was collected approximately 5 months ago at which point LDL was 106.  Current ASCVD risk score is above 50%.  Patient does continue to smoke.  He is not on statin therapy, he tells me he stopped his aspirin a while back and does not know if he should restart taking a daily aspirin.  Hypertension: Blood pressure has been elevated in previous visits, we had discussed that when he comes to this office visit he should avoid caffeine and cigarette smoking prior to the visit.  He tells me today he did drink coffee and did smoke a cigarette.  He is not on any antihypertensive currently.  Diabetes: Last A1c was collected about 5 months ago and it was 9.3.  He does continue on Metformin 500 mg twice a day.  Of note he does continue on vitamin D3 supplement as well.  He is due for flu vaccine today as well.  Past Medical History:  Diagnosis Date  . Borderline diabetes   . Peripheral neuropathy 07/15/2019  . Right lower lobe lung mass 04/11/2017  . Thalamic stroke ([place]) 07/15/2019  . Type II diabetes mellitus, uncontrolled ([place]) 07/15/2019  . Vitamin D deficiency disease 07/15/2019      Family History  Problem Relation Age of Onset  . Diabetes Mother 9  . Stroke Mother   . Colon cancer Neg Hx     Social History   Social History Narrative   Single.Lives alone in a motel.Works at [employer].   Social History   Tobacco Use  . Smoking status: Current Every Day Smoker    Packs/day: 1.00    Years: 45.00    Pack years: 45.00    Types: Cigarettes  . Smokeless tobacco: Never Used  Substance Use Topics  . Alcohol use: Yes    Alcohol/week: 1.0 standard drink     Types: 1 Cans of beer per week    Comment: every 3 days     Current Meds  Medication Sig  . aspirin 81 MG chewable tablet Chew 81 mg by mouth daily.  . blood glucose meter kit and supplies KIT Dispense based on patient and insurance preference. Use up to four times daily as directed. (FOR ICD-9 250.00, 250.01).  . Cholecalciferol (VITAMIN D3) 5000 units CAPS Take 2 capsules by mouth daily.   Marland Kitchen gabapentin (NEURONTIN) 100 MG capsule TAKE (2) CAPSULES BY MOUTH (3) TIMES DAILY.  . metFORMIN (GLUCOPHAGE) 500 MG tablet TAKE (1) TABLET BY MOUTH TWICE DAILY WITH A MEAL.  . [DISCONTINUED] aspirin EC 325 MG tablet Take 1 tablet (325 mg total) by mouth daily.  . [DISCONTINUED] blood glucose meter kit and supplies KIT Dispense based on patient and insurance preference. Use up to four times daily as directed. (FOR ICD-9 250.00, 250.01).    ROS:  Review of Systems  Constitutional: Negative.   Respiratory: Negative.   Cardiovascular: Negative.   Neurological: Negative.      Objective:   Today's Vitals: BP (!) 152/84   Pulse 77   Temp (!) 97.3 F (36.3 C) (Temporal)   Ht 5' 8"  (1.727 m)   Wt  173 lb 12.8 oz (78.8 kg)   SpO2 96%   BMI 26.43 kg/m  Vitals with BMI 08/03/2020 02/23/2020 02/23/2020  Height 5' 8"  - 5' 8"   Weight 173 lbs 13 oz - 172 lbs 10 oz  BMI 77.03 - 40.35  Systolic 248 185 909  Diastolic 84 311 85  Pulse 77 - 106     Physical Exam Vitals reviewed.  Constitutional:      Appearance: Normal appearance.  HENT:     Head: Normocephalic and atraumatic.  Cardiovascular:     Rate and Rhythm: Normal rate and regular rhythm.  Pulmonary:     Effort: Pulmonary effort is normal.     Breath sounds: Normal breath sounds.  Musculoskeletal:     Cervical back: Neck supple.  Skin:    General: Skin is warm and dry.  Neurological:     Mental Status: He is alert and oriented to person, place, and time.  Psychiatric:        Mood and Affect: Mood normal.        Behavior: Behavior  normal.        Thought Content: Thought content normal.        Judgment: Judgment normal.          Assessment and Plan   1. Hypertension, unspecified type   2. Uncontrolled type 2 diabetes mellitus with hyperglycemia ([place])   3. Flu vaccine need   4. Hyperlipidemia, unspecified hyperlipidemia type   5. Cigarette nicotine dependence without complication      Plan: 1.,  2.  Per shared decision making decided that patient should start low-dose ARB.  We will send prescription for losartan to patient's pharmacy he will follow-up in about 7 to 10 days for blood pressure check and metabolic panel redraw. 3.  We will administer flu vaccine today. 4., 5.  We did discuss his ASCVD risk score and that I would highly encourage him to quit smoking completely.  We also discussed pharmacological therapies and he would prefer to try aspirin alone as opposed to aspirin and statin therapy.  We did discuss bleeding risk associate with aspirin and signs of this and what to do if these signs occur.  He tells me he understands.  He will also try to quit smoking.   Tests ordered No orders of the defined types were placed in this encounter.     Meds ordered this encounter  Medications  . losartan (COZAAR) 25 MG tablet    Sig: Take 1 tablet (25 mg total) by mouth daily.    Dispense:  90 tablet    Refill:  0    Order Specific Question:   Supervising Provider    Answer:   Hurshel Party C [[id number]]  . blood glucose meter kit and supplies KIT    Sig: Dispense based on patient and insurance preference. Use up to four times daily as directed. (FOR ICD-9 250.00, 250.01).    Dispense:  1 each    Refill:  11    Order Specific Question:   Supervising Provider    Answer:   Doree Albee [[id number]]    Order Specific Question:   Number of strips    Answer:   100    Order Specific Question:   Number of lancets    Answer:   100    Patient to follow-up in 7 to 10 days or sooner as needed.  Ailene Ards,  NP

## 2020-08-03 NOTE — Addendum Note (Signed)
Addended by: Anibal Henderson on: 08/03/2020 08:53 AM   Modules accepted: Orders

## 2020-08-11 ENCOUNTER — Ambulatory Visit (INDEPENDENT_AMBULATORY_CARE_PROVIDER_SITE_OTHER): Payer: Medicare HMO | Admitting: Nurse Practitioner

## 2020-08-11 ENCOUNTER — Encounter (INDEPENDENT_AMBULATORY_CARE_PROVIDER_SITE_OTHER): Payer: Self-pay | Admitting: Nurse Practitioner

## 2020-08-11 ENCOUNTER — Other Ambulatory Visit: Payer: Self-pay

## 2020-08-11 VITALS — BP 128/76 | HR 91 | Temp 97.5°F | Ht 68.0 in | Wt 175.2 lb

## 2020-08-11 DIAGNOSIS — Z7185 Encounter for immunization safety counseling: Secondary | ICD-10-CM | POA: Diagnosis not present

## 2020-08-11 DIAGNOSIS — E559 Vitamin D deficiency, unspecified: Secondary | ICD-10-CM | POA: Diagnosis not present

## 2020-08-11 DIAGNOSIS — I10 Essential (primary) hypertension: Secondary | ICD-10-CM | POA: Diagnosis not present

## 2020-08-11 DIAGNOSIS — F17210 Nicotine dependence, cigarettes, uncomplicated: Secondary | ICD-10-CM

## 2020-08-11 DIAGNOSIS — E1165 Type 2 diabetes mellitus with hyperglycemia: Secondary | ICD-10-CM | POA: Diagnosis not present

## 2020-08-11 NOTE — Progress Notes (Signed)
Subjective:  Patient ID: Collin Yoder, male    DOB: 04-11-1951  Age: 69 y.o. MRN: [id number]  CC:  Chief Complaint  Patient presents with  . Follow-up    Patient states he is not any worse but not much better  . Hypertension  . Diabetes  . Other    Vitamin D deficiency      HPI  This patient arrives today for the above.  Hypertension: He was started on 25 mg of losartan at last office visit.  He tells me he is tolerating medication well and has not felt any negative side effects from starting it.  He is due for CMP recheck and blood pressure check today.  Diabetes: Last A1c was 9.3 and this was collected approximately 6 months ago.  He continues on Metformin 500 mg twice daily.  Urine was checked for albuminuria a little bit over a year ago and was negative at that time.  He tells me he has a an eye doctor appointment within the next couple of months for his diabetic eye exam.  We did discuss that his A1c today and he tells me that he is not interested in making medication changes, but feels quite motivated to make changes to his diet.  Vitamin D deficiency: He is on 5000 IUs of vitamin D3 daily.  Last serum check was 113 at that time he was on 10,000 IUs of vitamin D3.  He has reduced his intake down to 5000 since then.  He is due to have serum check today.  Of note, he is trying to reduce intake of cigarette smoking.  He also had a question regarding pneumonia vaccinations.  He tells me he wasn't sure if he supposed to get them this year as he thought they were meant to be administered yearly or at least every 2 years.  Past Medical History:  Diagnosis Date  . Borderline diabetes   . Peripheral neuropathy 07/15/2019  . Right lower lobe lung mass 04/11/2017  . Thalamic stroke ([place]) 07/15/2019  . Type II diabetes mellitus, uncontrolled ([place]) 07/15/2019  . Vitamin D deficiency disease 07/15/2019      Family History  Problem Relation Age of Onset  . Diabetes Mother  34  . Stroke Mother   . Colon cancer Neg Hx     Social History   Social History Narrative   Single.Lives alone in a motel.Works at [employer].   Social History   Tobacco Use  . Smoking status: Current Every Day Smoker    Packs/day: 1.00    Years: 45.00    Pack years: 45.00    Types: Cigarettes  . Smokeless tobacco: Never Used  Substance Use Topics  . Alcohol use: Yes    Alcohol/week: 1.0 standard drink    Types: 1 Cans of beer per week    Comment: every 3 days     Current Meds  Medication Sig  . aspirin 81 MG chewable tablet Chew 81 mg by mouth daily.  . blood glucose meter kit and supplies KIT Dispense based on patient and insurance preference. Use up to four times daily as directed. (FOR ICD-9 250.00, 250.01).  . Cholecalciferol (VITAMIN D3) 5000 units CAPS Take 1 capsule by mouth daily.  Marland Kitchen gabapentin (NEURONTIN) 100 MG capsule TAKE (2) CAPSULES BY MOUTH (3) TIMES DAILY.  Marland Kitchen losartan (COZAAR) 25 MG tablet Take 1 tablet (25 mg total) by mouth daily.  . metFORMIN (GLUCOPHAGE) 500 MG tablet TAKE (1) TABLET BY  MOUTH TWICE DAILY WITH A MEAL.    ROS:  Review of Systems  Constitutional: Negative.   Eyes: Negative for blurred vision.  Respiratory: Negative for shortness of breath.   Cardiovascular: Negative for chest pain.  Neurological: Negative for dizziness and headaches.     Objective:   Today's Vitals: BP 128/76   Pulse 91   Temp (!) 97.5 F (36.4 C) (Temporal)   Ht _0  (1.727 m)   Wt 175 lb 3.2 oz (79.5 kg)   SpO2 95%   BMI 26.64 kg/m  Vitals with BMI 08/11/2020 08/03/2020 02/23/2020  Height _1  _2  -  Weight 175 lbs 3 oz 173 lbs 13 oz -  BMI 66.59 93.57 -  Systolic 017 793 903  Diastolic 76 84 009  Pulse 91 77 -     Physical Exam Vitals reviewed.  Constitutional:      Appearance: Normal appearance.  HENT:     Head: Normocephalic and atraumatic.  Neck:     Vascular: No carotid bruit.  Cardiovascular:     Rate and Rhythm: Normal rate  and regular rhythm.  Pulmonary:     Effort: Pulmonary effort is normal.     Breath sounds: Normal breath sounds.  Musculoskeletal:     Cervical back: Neck supple.  Skin:    General: Skin is warm and dry.  Neurological:     Mental Status: He is alert and oriented to person, place, and time.  Psychiatric:        Mood and Affect: Mood normal.        Behavior: Behavior normal.        Thought Content: Thought content normal.        Judgment: Judgment normal.          Assessment and Plan   1. Uncontrolled type 2 diabetes mellitus with hyperglycemia ([place])   2. Hypertension, unspecified type   3. Vitamin D deficiency disease   4. Cigarette nicotine dependence without complication   5. Vaccine counseling      Plan: 1.  We discussed diet in great detail today.  He will try to make changes including focusing more on whole foods and reducing his intake of processed carbohydrates.  We'll check A1c as well as check urine for albuminuria today.  He will follow-up in 3 months and we did discuss that if his A1c remains around 9 we may need to consider adjustments to pharmacological therapy.  He tells me he understands. 2.  Blood pressure much better controlled today he'll continue on his ARB pending CMP results. 3.  For now he continue on his 5000 IUs of vitamin D3 daily.  We'll check serum level and CMP today for further evaluation. 4.  I encouraged him to continue trying to cut down on his cigarette smoking. 5.  We did discuss flu pneumonia vaccines in detail and that currently the CDC does not recommend additional boosters after the age 48.  All other boosters are recommended every 5 years for pneumonia vaccines.  He tells me he understands.  Of note, he has received PCV 13 and PPSV23 within the last 5 years.   Tests ordered Orders Placed This Encounter  Procedures  . CMP with eGFR([facility])  . Vitamin D, 25-hydroxy  . Hemoglobin A1c  . Microalbumin/Creatinine Ratio, Urine      No  orders of the defined types were placed in this encounter.   Patient to follow-up in 3 months or sooner as needed.  [place] E  Pearline Cables, NP

## 2020-08-11 NOTE — Patient Instructions (Signed)
[facility] Dietary Recommendations for Weight Loss What to Avoid . Avoid added sugars o Often added sugar can be found in processed foods such as many condiments, dry cereals, cakes, cookies, chips, crisps, crackers, candies, sweetened drinks, etc.  o Read labels and AVOID/DECREASE use of foods with the following in their ingredient list: Sugar, fructose, high fructose corn syrup, sucrose, glucose, maltose, dextrose, molasses, cane sugar, brown sugar, any type of syrup, agave nectar, etc.   . Avoid snacking in between meals . Avoid foods made with flour o If you are going to eat food made with flour, choose those made with whole-grains; and, minimize your consumption as much as is tolerable . Avoid processed foods o These foods are generally stocked in the middle of the grocery store. Focus on shopping on the perimeter of the grocery.  . Avoid Meat  o We recommend following a plant-based diet at [facility]. Thus, we recommend avoiding meat as a general rule. Consider eating beans, legumes, eggs, and/or dairy products for regular protein sources o If you plan on eating meat limit to 4 ounces of meat at a time and choose lean options such as Fish, chicken, turkey. Avoid red meat intake such as pork and/or steak What to Include . Vegetables o GREEN LEAFY VEGETABLES: Kale, spinach, mustard greens, collard greens, cabbage, broccoli, etc. o OTHER: Asparagus, cauliflower, eggplant, carrots, peas, Brussel sprouts, tomatoes, bell peppers, zucchini, beets, cucumbers, etc. . Grains, seeds, and legumes o Beans: kidney beans, black eyed peas, garbanzo beans, black beans, pinto beans, etc. o Whole, unrefined grains: brown rice, barley, bulgur, oatmeal, etc. . Healthy fats  o Avoid highly processed fats such as vegetable oil o Examples of healthy fats: avocado, olives, virgin olive oil, dark chocolate (?72% Cocoa), nuts (peanuts, almonds, walnuts, cashews, pecans, etc.) . None to Low  Intake of Animal Sources of Protein o Meat sources: chicken, turkey, salmon, tuna. Limit to 4 ounces of meat at one time. o Consider limiting dairy sources, but when choosing dairy focus on: PLAIN Greek yogurt, cottage cheese, high-protein milk . Fruit o Choose berries  When to Eat . Intermittent Fasting: o Choosing not to eat for a specific time period, but DO FOCUS ON HYDRATION when fasting o Multiple Techniques: - Time Restricted Eating: eat 3 meals in a day, each meal lasting no more than 60 minutes, no snacks between meals - 16-18 hour fast: fast for 16 to 18 hours up to 7 days a week. Often suggested to start with 2-3 nonconsecutive days per week.  . Remember the time you sleep is counted as fasting.  . Examples of eating schedule: Fast from 7:00pm-11:00am. Eat between 11:00am-7:00pm.  - 24-hour fast: fast for 24 hours up to every other day. Often suggested to start with 1 day per week . Remember the time you sleep is counted as fasting . Examples of eating schedule:  o Eating day: eat 2-3 meals on your eating day. If doing 2 meals, each meal should last no more than 90 minutes. If doing 3 meals, each meal should last no more than 60 minutes. Finish last meal by 7:00pm. o Fasting day: Fast until 7:00pm.  o IF YOU FEEL UNWELL FOR ANY REASON/IN ANY WAY WHEN FASTING, STOP FASTING BY EATING A NUTRITIOUS SNACK OR LIGHT MEAL o ALWAYS FOCUS ON HYDRATION DURING FASTS - Acceptable Hydration sources: water, broths, tea/coffee (black tea/coffee is best but using a small amount of whole-fat dairy products in coffee/tea is acceptable).  -   Poor Hydration Sources: anything with sugar or artificial sweeteners added to it  These recommendations have been developed for patients that are actively receiving medical care from either Dr. [facility] or Samika Vetsch, DNP, NP-C at [facility]. These recommendations are developed for patients with specific medical conditions and are not meant to be  distributed or used by others that are not actively receiving care from either provider listed above at [facility]. It is not appropriate to participate in the above eating plans without proper medical supervision.   Reference: Fung, J. The obesity code. [place]: [place]; 2016.   

## 2020-08-12 ENCOUNTER — Other Ambulatory Visit (INDEPENDENT_AMBULATORY_CARE_PROVIDER_SITE_OTHER): Payer: Self-pay | Admitting: Nurse Practitioner

## 2020-08-12 DIAGNOSIS — E875 Hyperkalemia: Secondary | ICD-10-CM

## 2020-08-12 LAB — COMPLETE METABOLIC PANEL WITH GFR
AG Ratio: 1.6 (calc) (ref 1.0–2.5)
ALT: 10 U/L (ref 9–46)
AST: 10 U/L (ref 10–35)
Albumin: 4.4 g/dL (ref 3.6–5.1)
Alkaline phosphatase (APISO): 53 U/L (ref 35–144)
BUN/Creatinine Ratio: 18 (calc) (ref 6–22)
BUN: 22 mg/dL (ref 7–25)
CO2: 30 mmol/L (ref 20–32)
Calcium: 9.8 mg/dL (ref 8.6–10.3)
Chloride: 104 mmol/L (ref 98–110)
Creat: 1.19 mg/dL — ABNORMAL HIGH (ref 0.70–1.18)
GFR, Est African American: 71 mL/min/{1.73_m2} (ref >=60)
GFR, Est Non African American: 61 mL/min/{1.73_m2} (ref >=60)
Globulin: 2.7 g/dL (calc) (ref 1.9–3.7)
Glucose, Bld: 149 mg/dL — ABNORMAL HIGH (ref 65–99)
Potassium: 5.5 mmol/L — ABNORMAL HIGH (ref 3.5–5.3)
Sodium: 140 mmol/L (ref 135–146)
Total Bilirubin: 0.5 mg/dL (ref 0.2–1.2)
Total Protein: 7.1 g/dL (ref 6.1–8.1)

## 2020-08-12 LAB — VITAMIN D 25 HYDROXY (VIT D DEFICIENCY, FRACTURES): Vit D, 25-Hydroxy: 76 ng/mL (ref 30–100)

## 2020-08-12 LAB — HEMOGLOBIN A1C
Hgb A1c MFr Bld: 7.9 % of total Hgb — ABNORMAL HIGH (ref <5.7)
Mean Plasma Glucose: 180 (calc)
eAG (mmol/L): 10 (calc)

## 2020-09-08 ENCOUNTER — Other Ambulatory Visit (INDEPENDENT_AMBULATORY_CARE_PROVIDER_SITE_OTHER): Payer: Medicare HMO

## 2020-09-14 ENCOUNTER — Other Ambulatory Visit: Payer: Self-pay

## 2020-09-14 ENCOUNTER — Other Ambulatory Visit (INDEPENDENT_AMBULATORY_CARE_PROVIDER_SITE_OTHER): Payer: Medicare HMO

## 2020-09-14 DIAGNOSIS — E875 Hyperkalemia: Secondary | ICD-10-CM | POA: Diagnosis not present

## 2020-09-15 DIAGNOSIS — E559 Vitamin D deficiency, unspecified: Secondary | ICD-10-CM | POA: Diagnosis not present

## 2020-09-15 DIAGNOSIS — I10 Essential (primary) hypertension: Secondary | ICD-10-CM | POA: Diagnosis not present

## 2020-09-15 DIAGNOSIS — E1165 Type 2 diabetes mellitus with hyperglycemia: Secondary | ICD-10-CM | POA: Diagnosis not present

## 2020-09-15 LAB — COMPLETE METABOLIC PANEL WITH GFR
AG Ratio: 1.5 (calc) (ref 1.0–2.5)
ALT: 12 U/L (ref 9–46)
AST: 10 U/L (ref 10–35)
Albumin: 4.4 g/dL (ref 3.6–5.1)
Alkaline phosphatase (APISO): 54 U/L (ref 35–144)
BUN: 10 mg/dL (ref 7–25)
CO2: 28 mmol/L (ref 20–32)
Calcium: 9.6 mg/dL (ref 8.6–10.3)
Chloride: 102 mmol/L (ref 98–110)
Creat: 0.98 mg/dL (ref 0.70–1.18)
GFR, Est African American: 90 mL/min/{1.73_m2} (ref >=60)
GFR, Est Non African American: 77 mL/min/{1.73_m2} (ref >=60)
Globulin: 3 g/dL (calc) (ref 1.9–3.7)
Glucose, Bld: 202 mg/dL — ABNORMAL HIGH (ref 65–139)
Potassium: 4.7 mmol/L (ref 3.5–5.3)
Sodium: 140 mmol/L (ref 135–146)
Total Bilirubin: 0.6 mg/dL (ref 0.2–1.2)
Total Protein: 7.4 g/dL (ref 6.1–8.1)

## 2020-09-16 ENCOUNTER — Other Ambulatory Visit (INDEPENDENT_AMBULATORY_CARE_PROVIDER_SITE_OTHER): Payer: Self-pay | Admitting: Nurse Practitioner

## 2020-09-16 DIAGNOSIS — I10 Essential (primary) hypertension: Secondary | ICD-10-CM

## 2020-09-16 LAB — MICROALBUMIN / CREATININE URINE RATIO
Creatinine, Urine: 98 mg/dL (ref 20–320)
Microalb Creat Ratio: 28 mcg/mg creat (ref <30)
Microalb, Ur: 2.7 mg/dL

## 2020-09-20 ENCOUNTER — Other Ambulatory Visit (INDEPENDENT_AMBULATORY_CARE_PROVIDER_SITE_OTHER): Payer: Self-pay | Admitting: Nurse Practitioner

## 2020-09-20 DIAGNOSIS — E875 Hyperkalemia: Secondary | ICD-10-CM

## 2020-09-20 NOTE — Progress Notes (Signed)
cmp orders placed to monitor hyperkalemia.

## 2020-09-22 ENCOUNTER — Telehealth (INDEPENDENT_AMBULATORY_CARE_PROVIDER_SITE_OTHER): Payer: Self-pay | Admitting: Nurse Practitioner

## 2020-09-22 NOTE — Telephone Encounter (Signed)
I attempted to call this patient today to get him scheduled for a blood draw either today or tomorrow.  If he calls will one of you get him scheduled for a lab appointment?   I would prefer that this be done this week, if he cannot come in today or tomorrow then please encourage him to call [facility] and go to have his blood drawn sometime next week.  I will provide the phone number and website below to [facility] so he has to go there he can set up an appointment as opposed to being a walk-in but showing up as a walk-in to [facility] is always an option for him as well. Thank you.  To schedule an appointment with [facility] for your lab draw visit [email or web address] or Call: [phone]. Or you may go to [facility] as a walk-in. Their [place] location address is [address], [place], [place]. Their hours are Monday-Friday from 7:00AM-12:00PM and 1:00PM-5:00PM.

## 2020-09-23 NOTE — Telephone Encounter (Signed)
Called patient and LMOVM to return call  Left a detailed voice message for patient to call back to schedule a lab appointment.

## 2020-10-06 ENCOUNTER — Other Ambulatory Visit (INDEPENDENT_AMBULATORY_CARE_PROVIDER_SITE_OTHER): Payer: Medicare HMO

## 2020-10-06 ENCOUNTER — Other Ambulatory Visit: Payer: Self-pay

## 2020-10-06 ENCOUNTER — Telehealth (INDEPENDENT_AMBULATORY_CARE_PROVIDER_SITE_OTHER): Payer: Self-pay | Admitting: Nurse Practitioner

## 2020-10-06 DIAGNOSIS — E875 Hyperkalemia: Secondary | ICD-10-CM | POA: Diagnosis not present

## 2020-10-06 LAB — COMPLETE METABOLIC PANEL WITH GFR
AG Ratio: 1.5 (calc) (ref 1.0–2.5)
ALT: 12 U/L (ref 9–46)
AST: 12 U/L (ref 10–35)
Albumin: 4.3 g/dL (ref 3.6–5.1)
Alkaline phosphatase (APISO): 47 U/L (ref 35–144)
BUN: 15 mg/dL (ref 7–25)
CO2: 29 mmol/L (ref 20–32)
Calcium: 9.8 mg/dL (ref 8.6–10.3)
Chloride: 99 mmol/L (ref 98–110)
Creat: 1.01 mg/dL (ref 0.70–1.18)
GFR, Est African American: 86 mL/min/{1.73_m2} (ref >=60)
GFR, Est Non African American: 74 mL/min/{1.73_m2} (ref >=60)
Globulin: 2.9 g/dL (calc) (ref 1.9–3.7)
Glucose, Bld: 221 mg/dL — ABNORMAL HIGH (ref 65–139)
Potassium: 4.4 mmol/L (ref 3.5–5.3)
Sodium: 136 mmol/L (ref 135–146)
Total Bilirubin: 0.4 mg/dL (ref 0.2–1.2)
Total Protein: 7.2 g/dL (ref 6.1–8.1)

## 2020-10-06 NOTE — Telephone Encounter (Signed)
Will wait for blood work results prior to ordering refills

## 2020-10-07 ENCOUNTER — Other Ambulatory Visit (INDEPENDENT_AMBULATORY_CARE_PROVIDER_SITE_OTHER): Payer: Self-pay | Admitting: Nurse Practitioner

## 2020-10-07 DIAGNOSIS — E1165 Type 2 diabetes mellitus with hyperglycemia: Secondary | ICD-10-CM

## 2020-10-07 DIAGNOSIS — G63 Polyneuropathy in diseases classified elsewhere: Secondary | ICD-10-CM

## 2020-10-07 DIAGNOSIS — I10 Essential (primary) hypertension: Secondary | ICD-10-CM

## 2020-10-07 MED ORDER — METFORMIN HCL 500 MG PO TABS: 500.0000 mg | ORAL_TABLET | Freq: Two times a day (BID) | ORAL | 0 refills | Status: DC

## 2020-10-07 MED ORDER — GABAPENTIN 100 MG PO CAPS: 200.0000 mg | ORAL_CAPSULE | Freq: Three times a day (TID) | ORAL | 2 refills | Status: DC

## 2020-10-07 MED ORDER — LOSARTAN POTASSIUM 25 MG PO TABS: 25.0000 mg | ORAL_TABLET | Freq: Every day | ORAL | 0 refills | Status: DC

## 2020-10-07 NOTE — Progress Notes (Addendum)
Patient called back and I gave him the message. Patient stated that he has been taking his Losartan daily before having his labs done. Patient verbalized an understanding and thanked [place].  Called patient and LMOVM to return call  Left a detailed voice message to let patient know that I have his lab results and need to go over message from Judson Roch with him and to please call back.

## 2020-10-07 NOTE — Progress Notes (Signed)
I forgot to add this info to the result note I sent you earlier, but please let this patient know that I have refilled his medications based on his blood work. Please verify that he has been taking his losartan. If so, he can continue to take it as his blood work looks good. I have sent gabapentin, losartan, metformin to [facility]

## 2020-11-17 ENCOUNTER — Other Ambulatory Visit: Payer: Self-pay

## 2020-11-17 ENCOUNTER — Encounter (INDEPENDENT_AMBULATORY_CARE_PROVIDER_SITE_OTHER): Payer: Self-pay | Admitting: Nurse Practitioner

## 2020-11-17 ENCOUNTER — Ambulatory Visit (INDEPENDENT_AMBULATORY_CARE_PROVIDER_SITE_OTHER): Payer: Medicare HMO | Admitting: Nurse Practitioner

## 2020-11-17 VITALS — BP 165/78 | HR 87 | Temp 97.5°F | Ht 69.0 in | Wt 179.8 lb

## 2020-11-17 DIAGNOSIS — I10 Essential (primary) hypertension: Secondary | ICD-10-CM | POA: Diagnosis not present

## 2020-11-17 DIAGNOSIS — E559 Vitamin D deficiency, unspecified: Secondary | ICD-10-CM

## 2020-11-17 DIAGNOSIS — E1165 Type 2 diabetes mellitus with hyperglycemia: Secondary | ICD-10-CM | POA: Diagnosis not present

## 2020-11-17 MED ORDER — LOSARTAN POTASSIUM 50 MG PO TABS: 50.0000 mg | ORAL_TABLET | Freq: Every day | ORAL | 0 refills | Status: DC

## 2020-11-17 NOTE — Progress Notes (Signed)
Subjective:  Patient ID: Collin Yoder, male    DOB: July 01, 1951  Age: 70 y.o. MRN: [id number]  CC:  Chief Complaint  Patient presents with  . Hypertension  . Diabetes  . Other    Vitamin D deficiency      HPI  This patient arrives today for the above.  Hypertension: He continues on losartan 25 mg daily.  Tells me he checks his blood pressure sometimes at home and that it has been running high.  He wonders if he can take apple cider vinegar as a supplement to treat his blood pressure.  Diabetes: He continues on Metformin 500 mg twice a day.  Last A1c was 7.9.  He is due to have this rechecked again today.  He is not on statin therapy.  He is on ARB.  He tells me he has a eye appointment scheduled in the upcoming weeks.  Vitamin D deficiency: He continues on 5000 IUs of vitamin D3 daily.  Last serum check his vitamin D was over 100, at that time he was on 10,000 IUs of vitamin D3 and has since reduced his intake down to 5000.  Past Medical History:  Diagnosis Date  . Borderline diabetes   . Peripheral neuropathy 07/15/2019  . Right lower lobe lung mass 04/11/2017  . Thalamic stroke ([place]) 07/15/2019  . Type II diabetes mellitus, uncontrolled ([place]) 07/15/2019  . Vitamin D deficiency disease 07/15/2019      Family History  Problem Relation Age of Onset  . Diabetes Mother 40  . Stroke Mother   . Colon cancer Neg Hx     Social History   Social History Narrative   Single.Lives alone in a motel.Works at [employer].   Social History   Tobacco Use  . Smoking status: Current Every Day Smoker    Packs/day: 1.00    Years: 45.00    Pack years: 45.00    Types: Cigarettes  . Smokeless tobacco: Never Used  Substance Use Topics  . Alcohol use: Yes    Alcohol/week: 1.0 standard drink    Types: 1 Cans of beer per week    Comment: every 3 days     Current Meds  Medication Sig  . aspirin 81 MG chewable tablet Chew 81 mg by mouth daily.  . Cholecalciferol  (VITAMIN D3) 5000 units CAPS Take 1 capsule by mouth daily.  Marland Kitchen gabapentin (NEURONTIN) 100 MG capsule Take 2 capsules (200 mg total) by mouth 3 (three) times daily.  . metFORMIN (GLUCOPHAGE) 500 MG tablet Take 1 tablet (500 mg total) by mouth 2 (two) times daily with a meal.  . [DISCONTINUED] losartan (COZAAR) 25 MG tablet Take 1 tablet (25 mg total) by mouth daily.    ROS:  Review of Systems  Constitutional: Negative for fever.  Eyes: Positive for blurred vision.  Respiratory: Negative for cough and shortness of breath.   Cardiovascular: Negative for chest pain.  Neurological: Negative for dizziness and headaches.     Objective:   Today's Vitals: BP (!) 165/78   Pulse 87   Temp (!) 97.5 F (36.4 C)   Ht 5' 9"  (1.753 m)   Wt 179 lb 12.8 oz (81.6 kg)   SpO2 98%   BMI 26.55 kg/m  Vitals with BMI 11/17/2020 08/11/2020 08/03/2020  Height 5' 9"  5' 8"  5' 8"   Weight 179 lbs 13 oz 175 lbs 3 oz 173 lbs 13 oz  BMI 26.54 35.32 99.24  Systolic 268 341 962  Diastolic  78 76 84  Pulse 87 91 77     Physical Exam Vitals reviewed.  Constitutional:      Appearance: Normal appearance.  HENT:     Head: Normocephalic and atraumatic.  Cardiovascular:     Rate and Rhythm: Normal rate and regular rhythm.  Pulmonary:     Effort: Pulmonary effort is normal.     Breath sounds: Normal breath sounds.  Musculoskeletal:     Cervical back: Neck supple.  Skin:    General: Skin is warm and dry.  Neurological:     Mental Status: He is alert and oriented to person, place, and time.  Psychiatric:        Mood and Affect: Mood normal.        Behavior: Behavior normal.        Thought Content: Thought content normal.        Judgment: Judgment normal.          Assessment and Plan   1. Uncontrolled type 2 diabetes mellitus with hyperglycemia ([place])   2. Hypertension, unspecified type   3. Vitamin D deficiency disease      Plan: 1.  We will check A1c today.  We did discuss that he should  avoid snacks and sugary candies during the day.  We also discussed that he should try to focus on eating whole foods such as vegetables and avoid processed foods such as pastas and breads.  He was encouraged to continue walking regularly throughout the day. 2.  Blood pressure above goal so we will increase losartan from 25 to 50 mg daily.  He is wondering if he can try taking 1 teaspoon of apple cider vinegar daily for treatment of his hypertension, I told him that he can certainly try this and if for some reason he feels unwell when starting the apple cider vinegar he should let us know.  He tells me he understands. 3.  He will continue on his vitamin D supplement and we will check vitamin D level today.   Tests ordered Orders Placed This Encounter  Procedures  . CMP with eGFR([facility])  . Hemoglobin A1c  . Vitamin D, 25-hydroxy      Meds ordered this encounter  Medications  . losartan (COZAAR) 50 MG tablet    Sig: Take 1 tablet (50 mg total) by mouth daily.    Dispense:  90 tablet    Refill:  0    Order Specific Question:   Supervising Provider    Answer:   Doree Albee [[id number]]    Patient to follow-up in 10 days for lab visit to monitor CMP, and then will follow up as scheduled with me in March.  She was encouraged to let them know if he feels unwell and needs to be seen sooner.  Ailene Ards, NP

## 2020-11-18 ENCOUNTER — Encounter (INDEPENDENT_AMBULATORY_CARE_PROVIDER_SITE_OTHER): Payer: Self-pay | Admitting: Nurse Practitioner

## 2020-11-18 ENCOUNTER — Other Ambulatory Visit (INDEPENDENT_AMBULATORY_CARE_PROVIDER_SITE_OTHER): Payer: Self-pay | Admitting: Nurse Practitioner

## 2020-11-18 DIAGNOSIS — E1165 Type 2 diabetes mellitus with hyperglycemia: Secondary | ICD-10-CM

## 2020-11-18 DIAGNOSIS — G63 Polyneuropathy in diseases classified elsewhere: Secondary | ICD-10-CM

## 2020-11-18 DIAGNOSIS — I10 Essential (primary) hypertension: Secondary | ICD-10-CM

## 2020-11-18 LAB — VITAMIN D 25 HYDROXY (VIT D DEFICIENCY, FRACTURES): Vit D, 25-Hydroxy: 79 ng/mL (ref 30–100)

## 2020-11-18 LAB — COMPLETE METABOLIC PANEL WITH GFR
AG Ratio: 1.8 (calc) (ref 1.0–2.5)
ALT: 12 U/L (ref 9–46)
AST: 12 U/L (ref 10–35)
Albumin: 4.7 g/dL (ref 3.6–5.1)
Alkaline phosphatase (APISO): 50 U/L (ref 35–144)
BUN: 14 mg/dL (ref 7–25)
CO2: 29 mmol/L (ref 20–32)
Calcium: 10 mg/dL (ref 8.6–10.3)
Chloride: 102 mmol/L (ref 98–110)
Creat: 1.05 mg/dL (ref 0.70–1.18)
GFR, Est African American: 82 mL/min/{1.73_m2} (ref >=60)
GFR, Est Non African American: 71 mL/min/{1.73_m2} (ref >=60)
Globulin: 2.6 g/dL (calc) (ref 1.9–3.7)
Glucose, Bld: 139 mg/dL (ref 65–139)
Potassium: 5 mmol/L (ref 3.5–5.3)
Sodium: 139 mmol/L (ref 135–146)
Total Bilirubin: 0.6 mg/dL (ref 0.2–1.2)
Total Protein: 7.3 g/dL (ref 6.1–8.1)

## 2020-11-18 LAB — HEMOGLOBIN A1C
Hgb A1c MFr Bld: 7.7 % of total Hgb — ABNORMAL HIGH (ref <5.7)
Mean Plasma Glucose: 174 mg/dL
eAG (mmol/L): 9.7 mmol/L

## 2020-11-18 MED ORDER — METFORMIN HCL 500 MG PO TABS
ORAL_TABLET | ORAL | 0 refills | Status: DC
Start: 2020-11-18 — End: 2021-01-06

## 2020-11-29 ENCOUNTER — Other Ambulatory Visit (INDEPENDENT_AMBULATORY_CARE_PROVIDER_SITE_OTHER): Payer: Medicare HMO

## 2020-12-06 ENCOUNTER — Telehealth (INDEPENDENT_AMBULATORY_CARE_PROVIDER_SITE_OTHER): Payer: Self-pay

## 2020-12-06 NOTE — Telephone Encounter (Signed)
Patient called and stated that he has had a runny nose for months now and has clear drainage and he has to blow his nose all day and he stated that he does not have a problem during the night but just during the day and wanted to know what to take during the day to stop his runny nose? Patient denies any symptoms although he did say that he was not feeling good last week and he is having a swab done and does not know that results yet.   Please advise.

## 2020-12-06 NOTE — Telephone Encounter (Signed)
Called patient and LMOVM to return call  Called patient and left a detailed voice message with response message from Dr. Anastasio Champion.

## 2020-12-06 NOTE — Telephone Encounter (Signed)
I would recommend he try over-the-counter allergy medicine such as Zyrtec which he can take at night (it can make him very sleepy).  If his COVID-19 test is negative, perhaps we can see him in the office later this week or next week.

## 2020-12-07 NOTE — Telephone Encounter (Signed)
Patient called and stated that his Covid test is negative.

## 2020-12-09 ENCOUNTER — Encounter (INDEPENDENT_AMBULATORY_CARE_PROVIDER_SITE_OTHER): Payer: Medicare HMO | Admitting: Nurse Practitioner

## 2020-12-15 ENCOUNTER — Other Ambulatory Visit (INDEPENDENT_AMBULATORY_CARE_PROVIDER_SITE_OTHER): Payer: Self-pay | Admitting: Internal Medicine

## 2020-12-15 ENCOUNTER — Telehealth (INDEPENDENT_AMBULATORY_CARE_PROVIDER_SITE_OTHER): Payer: Self-pay

## 2020-12-15 DIAGNOSIS — E1165 Type 2 diabetes mellitus with hyperglycemia: Secondary | ICD-10-CM

## 2020-12-15 MED ORDER — TRUEPLUS LANCETS 33G MISC: 1.0000 [IU] | Freq: Every day | 1 refills | Status: DC

## 2020-12-15 MED ORDER — TRUE METRIX BLOOD GLUCOSE TEST VI STRP: ORAL_STRIP | 12 refills | Status: DC

## 2020-12-15 MED ORDER — TRUE METRIX GO GLUCOSE METER W/DEVICE KIT
1.0000 [IU] | PACK | Freq: Every day | 0 refills | Status: DC
Start: 2020-12-15 — End: 2021-02-22

## 2020-12-15 MED ORDER — BD SWAB SINGLE USE REGULAR PADS: 1.0000 [IU] | MEDICATED_PAD | Freq: Every day | 1 refills | Status: DC

## 2020-12-15 NOTE — Telephone Encounter (Signed)
Received a fax from [facility] requesting the following prescriptions:  True Metrix Blood Glucose Meter kit  True Metrix test strips   Trueplus 33G Lancets  BD Single use Alcohol Swabs  90 day supplies please. Thank you!

## 2020-12-21 ENCOUNTER — Ambulatory Visit (INDEPENDENT_AMBULATORY_CARE_PROVIDER_SITE_OTHER): Payer: Medicare HMO | Admitting: Nurse Practitioner

## 2020-12-21 ENCOUNTER — Other Ambulatory Visit: Payer: Self-pay

## 2020-12-21 VITALS — BP 133/78 | HR 88 | Temp 97.6°F | Resp 18 | Ht 69.0 in | Wt 181.0 lb

## 2020-12-21 DIAGNOSIS — E1165 Type 2 diabetes mellitus with hyperglycemia: Secondary | ICD-10-CM

## 2020-12-21 DIAGNOSIS — I10 Essential (primary) hypertension: Secondary | ICD-10-CM

## 2020-12-21 LAB — COMPLETE METABOLIC PANEL WITH GFR
AG Ratio: 1.6 (calc) (ref 1.0–2.5)
ALT: 10 U/L (ref 9–46)
AST: 13 U/L (ref 10–35)
Albumin: 4.4 g/dL (ref 3.6–5.1)
Alkaline phosphatase (APISO): 49 U/L (ref 35–144)
BUN: 17 mg/dL (ref 7–25)
CO2: 29 mmol/L (ref 20–32)
Calcium: 10.1 mg/dL (ref 8.6–10.3)
Chloride: 101 mmol/L (ref 98–110)
Creat: 1.06 mg/dL (ref 0.70–1.18)
GFR, Est African American: 81 mL/min/{1.73_m2} (ref >=60)
GFR, Est Non African American: 70 mL/min/{1.73_m2} (ref >=60)
Globulin: 2.8 g/dL (calc) (ref 1.9–3.7)
Glucose, Bld: 104 mg/dL (ref 65–139)
Potassium: 4.9 mmol/L (ref 3.5–5.3)
Sodium: 138 mmol/L (ref 135–146)
Total Bilirubin: 0.8 mg/dL (ref 0.2–1.2)
Total Protein: 7.2 g/dL (ref 6.1–8.1)

## 2020-12-21 NOTE — Progress Notes (Signed)
Subjective:  Patient ID: Collin Yoder, male    DOB: [birth year]  Age: 70 y.o. MRN: [id number]  CC:  Chief Complaint  Patient presents with  . Hypertension  . Diabetes      HPI  This patient arrives today for the above.  Hypertension: We increase his losartan to 50 mg at last office visit.  He tolerated medication well.  He was supposed to come back for lab follow-up proximately 10 days after the medication was increased but for some reason never did come back to the office.  Diabetes: We increase his Metformin to 1000 mg in the morning and 500 mg in the evening.  He tells me is tolerating this well.  He does not check his blood sugar routinely, however he tells me he did check it this morning and it was 110.  Past Medical History:  Diagnosis Date  . Borderline diabetes   . Peripheral neuropathy 07/15/2019  . Right lower lobe lung mass 04/11/2017  . Thalamic stroke ([place]) 07/15/2019  . Type II diabetes mellitus, uncontrolled ([place]) 07/15/2019  . Vitamin D deficiency disease 07/15/2019      Family History  Problem Relation Age of Onset  . Diabetes Mother 46  . Stroke Mother   . Colon cancer Neg Hx     Social History   Social History Narrative   Single.Lives alone in a motel.Works at [employer].   Social History   Tobacco Use  . Smoking status: Current Every Day Smoker    Packs/day: 1.00    Years: 45.00    Pack years: 45.00    Types: Cigarettes  . Smokeless tobacco: Never Used  Substance Use Topics  . Alcohol use: Yes    Alcohol/week: 1.0 standard drink    Types: 1 Cans of beer per week    Comment: every 3 days     Current Meds  Medication Sig  . Alcohol Swabs (B-D SINGLE USE SWABS REGULAR) PADS 1 Units by Does not apply route daily at 12 noon.  Marland Kitchen aspirin 81 MG chewable tablet Chew 81 mg by mouth daily.  . blood glucose meter kit and supplies KIT Dispense based on patient and insurance preference. Use up to four times daily as directed. (FOR  ICD-9 250.00, 250.01).  . Blood Glucose Monitoring Suppl (TRUE METRIX GO GLUCOSE METER) w/Device KIT 1 Units by Does not apply route daily at 12 noon.  . Cholecalciferol (VITAMIN D3) 5000 units CAPS Take 1 capsule by mouth daily.  Marland Kitchen gabapentin (NEURONTIN) 100 MG capsule Take 2 capsules (200 mg total) by mouth 3 (three) times daily.  Marland Kitchen glucose blood (TRUE METRIX BLOOD GLUCOSE TEST) test strip Use as instructed  . losartan (COZAAR) 50 MG tablet Take 1 tablet (50 mg total) by mouth daily.  . metFORMIN (GLUCOPHAGE) 500 MG tablet Take 107m (2 tablets) by mouth in the morning and 5083m(1 tablet) by mouth in the evening.  . TRUEplus Lancets 33G MISC 1 Units by Does not apply route daily at 12 noon.    ROS:  Review of Systems  Constitutional: Negative for fever.  Eyes: Positive for blurred vision (believes this is due to cataracts).  Respiratory: Negative for shortness of breath.   Cardiovascular: Negative for chest pain.  Neurological: Negative for headaches.     Objective:   Today's Vitals: BP 133/78 (BP Location: Right Arm, Patient Position: Sitting, Cuff Size: Normal)   Pulse 88   Temp 97.6 F (36.4 C) (Temporal)  Resp 18   Ht 5' 9"  (1.753 m)   Wt 181 lb (82.1 kg)   SpO2 96%   BMI 26.73 kg/m  Vitals with BMI 12/21/2020 11/17/2020 08/11/2020  Height 5' 9"  5' 9"  5' 8"   Weight 181 lbs 179 lbs 13 oz 175 lbs 3 oz  BMI 26.72 33.00 76.22  Systolic 633 354 562  Diastolic 78 78 76  Pulse 88 87 91     Physical Exam Vitals reviewed.  Constitutional:      Appearance: Normal appearance.  HENT:     Head: Normocephalic and atraumatic.  Cardiovascular:     Rate and Rhythm: Normal rate and regular rhythm.  Pulmonary:     Effort: Pulmonary effort is normal.     Breath sounds: Normal breath sounds.  Musculoskeletal:     Cervical back: Neck supple.  Skin:    General: Skin is warm and dry.  Neurological:     Mental Status: He is alert and oriented to person, place, and time.   Psychiatric:        Mood and Affect: Mood normal.        Behavior: Behavior normal.        Thought Content: Thought content normal.        Judgment: Judgment normal.          Assessment and Plan   1. Hypertension, unspecified type   2. Uncontrolled type 2 diabetes mellitus with hyperglycemia ([place])      Plan: 1.  Blood pressure much better controlled on current regimen he will continue taking his losartan 50 mg daily.  We will check CMP to monitor kidney function and electrolytes. 2.  He will continue on his current dose of Metformin and we will collect A1c when it comes time.    Tests ordered Orders Placed This Encounter  Procedures  . CMP with eGFR([facility])      No orders of the defined types were placed in this encounter.   Patient to follow-up as scheduled next month for his annual wellness visit or sooner as needed.  Ailene Ards, NP

## 2020-12-21 NOTE — Patient Instructions (Signed)
Check your blood sugar about 2 hours after you eat a meal to determine how it will affect your blood sugar. The goal should be that your blood sugar is less than 180 2 hours after a meal.

## 2021-01-06 ENCOUNTER — Encounter (INDEPENDENT_AMBULATORY_CARE_PROVIDER_SITE_OTHER): Payer: Self-pay | Admitting: Nurse Practitioner

## 2021-01-06 ENCOUNTER — Ambulatory Visit (INDEPENDENT_AMBULATORY_CARE_PROVIDER_SITE_OTHER): Payer: Medicare HMO | Admitting: Nurse Practitioner

## 2021-01-06 ENCOUNTER — Other Ambulatory Visit: Payer: Self-pay

## 2021-01-06 VITALS — BP 154/82 | HR 79 | Temp 97.8°F | Ht 67.25 in | Wt 173.8 lb

## 2021-01-06 DIAGNOSIS — E1165 Type 2 diabetes mellitus with hyperglycemia: Secondary | ICD-10-CM

## 2021-01-06 DIAGNOSIS — G63 Polyneuropathy in diseases classified elsewhere: Secondary | ICD-10-CM

## 2021-01-06 DIAGNOSIS — Z0000 Encounter for general adult medical examination without abnormal findings: Secondary | ICD-10-CM

## 2021-01-06 MED ORDER — GABAPENTIN 100 MG PO CAPS: 200.0000 mg | ORAL_CAPSULE | Freq: Three times a day (TID) | ORAL | 0 refills | Status: DC

## 2021-01-06 MED ORDER — METFORMIN HCL 500 MG PO TABS: ORAL_TABLET | ORAL | 0 refills | Status: DC

## 2021-01-06 NOTE — Progress Notes (Signed)
Subjective:   Collin Yoder is a 70 y.o. male who presents for Medicare Annual/Subsequent preventive examination.  Review of Systems     Cardiac Risk Factors include: advanced age (>52mn, >>74women);diabetes mellitus;dyslipidemia;hypertension;male gender;smoking/ tobacco exposure     Objective:    Today's Vitals   01/06/21 1438  BP: (!) 154/82  Pulse: 79  Temp: 97.8 F (36.6 C)  TempSrc: Temporal  SpO2: 98%  Weight: 173 lb 12.8 oz (78.8 kg)  Height: 5' 7.25" (1.708 m)   Body mass index is 27.02 kg/m.  Advanced Directives 01/06/2021 01/27/2019 04/11/2017 03/29/2017 01/13/2017  Does Patient Have a Medical Advance Directive? _0   Would patient like information on creating a medical advance directive? No - Patient declined No - Patient declined - - -    Current Medications (verified) Outpatient Encounter Medications as of 01/06/2021  Medication Sig  . Alcohol Swabs (B-D SINGLE USE SWABS REGULAR) PADS 1 Units by Does not apply route daily at 12 noon.  .Marland Kitchenaspirin 81 MG chewable tablet Chew 81 mg by mouth daily.  . blood glucose meter kit and supplies KIT Dispense based on patient and insurance preference. Use up to four times daily as directed. (FOR ICD-9 250.00, 250.01).  . Blood Glucose Monitoring Suppl (TRUE METRIX GO GLUCOSE METER) w/Device KIT 1 Units by Does not apply route daily at 12 noon.  . Cholecalciferol (VITAMIN D3) 5000 units CAPS Take 1 capsule by mouth daily.  .Marland Kitchenglucose blood (TRUE METRIX BLOOD GLUCOSE TEST) test strip Use as instructed  . losartan (COZAAR) 50 MG tablet Take 1 tablet (50 mg total) by mouth daily.  . TRUEplus Lancets 33G MISC 1 Units by Does not apply route daily at 12 noon.  . [DISCONTINUED] gabapentin (NEURONTIN) 100 MG capsule Take 2 capsules (200 mg total) by mouth 3 (three) times daily.  . [DISCONTINUED] metFORMIN (GLUCOPHAGE) 500 MG tablet Take 10058m(2 tablets) by mouth in the morning and 50018m1 tablet) by mouth in the evening.   . gabapentin (NEURONTIN) 100 MG capsule Take 2 capsules (200 mg total) by mouth 3 (three) times daily.  . metFORMIN (GLUCOPHAGE) 500 MG tablet Take 1000m63m tablets) by mouth in the morning and 500mg76mtablet) by mouth in the evening.   No facility-administered encounter medications on file as of 01/06/2021.    Allergies (verified) Patient has no known allergies.   History: Past Medical History:  Diagnosis Date  . Borderline diabetes   . Peripheral neuropathy 07/15/2019  . Right lower lobe lung mass 04/11/2017  . Thalamic stroke ([facility]) [place]13/2020  . Type II diabetes mellitus, uncontrolled ([facility]) [place]13/2020  . Vitamin D deficiency disease 07/15/2019   Past Surgical History:  Procedure Laterality Date  . APPENDECTOMY     Family History  Problem Relation Age of Onset  . Diabetes Mother 77  .26troke Mother   . Colon cancer Neg Hx    Social History   Socioeconomic History  . Marital status: Single    Spouse name: Not on file  . Number of children: Not on file  . Years of education: Not on file  . Highest education level: Not on file  Occupational History  . Not on file  Tobacco Use  . Smoking status: Current Every Day Smoker    Packs/day: 1.00    Years: 45.00    Pack years: 45.00    Types: Cigarettes  . Smokeless tobacco: Never Used  Vaping Use  . Vaping Use:  Never used  Substance and Sexual Activity  . Alcohol use: Yes    Alcohol/week: 1.0 standard drink    Types: 1 Cans of beer per week    Comment: every 3 days  . Drug use: No  . Sexual activity: Not on file  Other Topics Concern  . Not on file  Social History Narrative   Single.Lives alone in a motel.Works at [employer].   Social Determinants of Health   Financial Resource Strain: Not on file  Food Insecurity: Not on file  Transportation Needs: Not on file  Physical Activity: Not on file  Stress: Not on file  Social Connections: Not on file    Tobacco Counseling Ready to quit: Not  Answered Counseling given: Yes   Clinical Intake:  Pre-visit preparation completed: Yes  Pain : No/denies pain     BMI - recorded: 27.02 Nutritional Status: BMI 25 -29 Overweight Nutritional Risks: None Diabetes: Yes CBG done?: No Did pt. bring in CBG monitor from home?: No (136)  How often do you need to have someone help you when you read instructions, pamphlets, or other written materials from your doctor or pharmacy?: 1 - Never What is the last grade level you completed in school?: 12th grade  Diabetic? Yes  Interpreter Needed?: No  Information entered by :: Jeralyn Ruths, NP-C   Activities of Daily Living In your present state of health, do you have any difficulty performing the following activities: 01/06/2021 12/21/2020  Hearing? N N  Vision? Y Y  Comment - has to have surgery.  Difficulty concentrating or making decisions? N N  Walking or climbing stairs? N N  Dressing or bathing? N N  Doing errands, shopping? N N  Preparing Food and eating ? N -  Using the Toilet? N -  In the past six months, have you accidently leaked urine? N -  Do you have problems with loss of bowel control? N -  Managing your Medications? N -  Managing your Finances? N -  Housekeeping or managing your Housekeeping? N -  Some recent data might be hidden    Patient Care Team: Doree Albee, MD as PCP - General (Internal Medicine) Gala Romney Cristopher Estimable, MD as Consulting Physician (Gastroenterology) [place], [place] any recent Medical Services you may have received from other than [facility] providers in the past year (date may be approximate).     Assessment:   This is a routine wellness examination for Collin Yoder.  Hearing/Vision screen No exam data present  Dietary issues and exercise activities discussed: Current Exercise Habits: The patient does not participate in regular exercise at present, Exercise limited by: cardiac condition(s)  Goals   None    Depression  Screen PHQ 2/9 Scores 01/06/2021 02/23/2020  PHQ - 2 Score 0 0  PHQ- 9 Score 0 -  Exception Documentation - Medical reason    Fall Risk Fall Risk  01/06/2021 02/23/2020  Falls in the past year? 0 0  Number falls in past yr: - 0  Injury with Fall? - 0  Risk for fall due to : - No Fall Risks  Follow up - Falls evaluation completed    [place]:  Any stairs in or around the home? No  If so, are there any without handrails? N/A Home free of loose throw rugs in walkways, pet beds, electrical cords, etc? Yes  Adequate lighting in your home to reduce risk of falls? Yes   ASSISTIVE  DEVICES UTILIZED TO PREVENT FALLS:  Life alert? No  Use of a cane, walker or w/c? Yes, Cane Grab bars in the bathroom? Yes  Shower chair or bench in shower? No  Elevated toilet seat or a handicapped toilet? Yes   TIMED UP AND GO:  Was the test performed? Yes .  Length of time to ambulate 10 feet: 11 sec.   Gait slow and steady without use of assistive device  Cognitive Function:     6CIT Screen 01/06/2021  What Year? 0 points  What month? 0 points  What time? 0 points  Count back from 20 0 points  Months in reverse 0 points  Repeat phrase 4 points  Total Score 4    Immunizations Immunization History  Administered Date(s) Administered  . Fluad Quad(high Dose 65+) 07/15/2019, 08/03/2020  . Pneumococcal Conjugate-13 07/26/2017  . Pneumococcal Polysaccharide-23 07/15/2019  . Td 10/02/2013    TDAP status: Up to date  Flu Vaccine status: Up to date  Pneumococcal vaccine status: Up to date  Covid-19 vaccine status: Declined, Education has been provided regarding the importance of this vaccine but patient still declined. Advised may receive this vaccine at local pharmacy or Health Dept.or vaccine clinic. Aware to provide a copy of the vaccination record if obtained from local pharmacy or Health Dept. Verbalized acceptance and understanding.  Qualifies for  Shingles Vaccine? Yes   Zostavax completed No   Shingrix Completed?: No.    Education has been provided regarding the importance of this vaccine. Patient has been advised to call insurance company to determine out of pocket expense if they have not yet received this vaccine. Advised may also receive vaccine at local pharmacy or Health Dept. Verbalized acceptance and understanding.  Screening Tests Health Maintenance  Topic Date Due  . COVID-19 Vaccine (1) Never done  . FOOT EXAM  Never done  . OPHTHALMOLOGY EXAM  Never done  . COLONOSCOPY (Pts 45-55yr Insurance coverage will need to be confirmed)  02/22/2021 (Originally 06/23/1994)  . INFLUENZA VACCINE  05/02/2021  . HEMOGLOBIN A1C  05/17/2021  . TETANUS/TDAP  10/03/2023  . Hepatitis C Screening  Completed  . PNA vac Low Risk Adult  Completed  . HPV VACCINES  Aged Out    Health Maintenance  Health Maintenance Due  Topic Date Due  . COVID-19 Vaccine (1) Never done  . FOOT EXAM  Never done  . OPHTHALMOLOGY EXAM  Never done    Colon Cancer Screening: Patient would prefer to hold off on screening at this time  Lung Cancer Screening: (Low Dose CT Chest recommended if Age 72-80years, 30 pack-year currently smoking OR have quit w/in 15years.) does qualify. ~Pack year is approximately 50  Lung Cancer Screening Referral: Patient would prefer to hold off for now  Additional Screening:  Hepatitis C Screening: does not qualify; Completed 07/2019  Vision Screening: Recommended annual ophthalmology exams for early detection of glaucoma and other disorders of the eye. Is the patient up to date with their annual eye exam?  Yes  Who is the provider or what is the name of the office in which the patient attends annual eye exams? Dr. SGershon CraneIf pt is not established with a provider, would they like to be referred to a provider to establish care? N/A.   Dental Screening: Recommended annual dental exams for proper oral hygiene  Community  Resource Referral / Chronic Care Management: CRR required this visit?  No   CCM required this visit?  No  Plan:    Patient is up-to-date with vaccines and health screenings that he is willing to undergo at this time.  He will consider COVID-19 vaccine, colon cancer screening, and lung cancer screening at a future date.   I have personally reviewed and noted the following in the patient's chart:   . Medical and social history . Use of alcohol, tobacco or illicit drugs  . Current medications and supplements . Functional ability and status . Nutritional status . Physical activity . Advanced directives . List of other physicians . Hospitalizations, surgeries, and ER visits in previous 12 months . Vitals . Screenings to include cognitive, depression, and falls . Referrals and appointments  In addition, I have reviewed and discussed with patient certain preventive protocols, quality metrics, and best practice recommendations. A written personalized care plan for preventive services as well as general preventive health recommendations were provided to patient.   He will follow-up as scheduled next month for his physical exam or sooner as needed.  Ailene Ards, NP   01/06/2021

## 2021-01-06 NOTE — Patient Instructions (Addendum)
  Collin Yoder , Thank you for taking time to come for your Medicare Wellness Visit. I appreciate your ongoing commitment to your health goals. Please review the following plan we discussed and let me know if I can assist you in the future.   These are the goals we discussed: Goals   None     This is a list of the screening recommended for you and due dates:  Health Maintenance  Topic Date Due  . Eye exam for diabetics  Never done  . COVID-19 Vaccine (1) 01/22/2021*  . Colon Cancer Screening  02/22/2021*  . Flu Shot  05/02/2021  . Hemoglobin A1C  05/17/2021  . Complete foot exam   01/06/2022  . Tetanus Vaccine  10/03/2023  .  Hepatitis C: One time screening is recommended by Center for Disease Control  (CDC) for  adults born from [birth year] through 1965.   Completed  . Pneumonia vaccines  Completed  . HPV Vaccine  Aged Out  *Topic was postponed. The date shown is not the original due date.

## 2021-01-06 NOTE — Progress Notes (Signed)
   Physical Exam Cardiovascular:     Pulses:          Dorsalis pedis pulses are 2+ on the right side and 2+ on the left side.  Musculoskeletal:     Right foot: No deformity.     Left foot: No deformity.  Feet:     Right foot:     Protective Sensation: 10 sites tested. 6 sites sensed.     Skin integrity: Skin integrity normal.     Toenail Condition: Right toenails are normal.     Left foot:     Protective Sensation: 10 sites tested. 8 sites sensed.     Skin integrity: Skin integrity normal.     Toenail Condition: Left toenails are normal.

## 2021-01-07 ENCOUNTER — Other Ambulatory Visit (INDEPENDENT_AMBULATORY_CARE_PROVIDER_SITE_OTHER): Payer: Self-pay | Admitting: Nurse Practitioner

## 2021-01-07 DIAGNOSIS — E1165 Type 2 diabetes mellitus with hyperglycemia: Secondary | ICD-10-CM

## 2021-01-07 DIAGNOSIS — G63 Polyneuropathy in diseases classified elsewhere: Secondary | ICD-10-CM

## 2021-01-12 DIAGNOSIS — H524 Presbyopia: Secondary | ICD-10-CM | POA: Diagnosis not present

## 2021-01-12 DIAGNOSIS — H2513 Age-related nuclear cataract, bilateral: Secondary | ICD-10-CM | POA: Diagnosis not present

## 2021-01-12 DIAGNOSIS — H52203 Unspecified astigmatism, bilateral: Secondary | ICD-10-CM | POA: Diagnosis not present

## 2021-01-12 DIAGNOSIS — E1136 Type 2 diabetes mellitus with diabetic cataract: Secondary | ICD-10-CM | POA: Diagnosis not present

## 2021-01-12 DIAGNOSIS — H2589 Other age-related cataract: Secondary | ICD-10-CM | POA: Diagnosis not present

## 2021-01-12 DIAGNOSIS — Z7984 Long term (current) use of oral hypoglycemic drugs: Secondary | ICD-10-CM | POA: Diagnosis not present

## 2021-01-12 LAB — HM DIABETES EYE EXAM

## 2021-01-30 HISTORY — PX: CATARACT EXTRACTION, BILATERAL: SHX1313

## 2021-02-09 ENCOUNTER — Encounter (INDEPENDENT_AMBULATORY_CARE_PROVIDER_SITE_OTHER): Payer: Medicare HMO | Admitting: Internal Medicine

## 2021-02-09 DIAGNOSIS — H2511 Age-related nuclear cataract, right eye: Secondary | ICD-10-CM | POA: Diagnosis not present

## 2021-02-09 DIAGNOSIS — H25012 Cortical age-related cataract, left eye: Secondary | ICD-10-CM | POA: Diagnosis not present

## 2021-02-09 DIAGNOSIS — H2512 Age-related nuclear cataract, left eye: Secondary | ICD-10-CM | POA: Diagnosis not present

## 2021-02-16 DIAGNOSIS — H2512 Age-related nuclear cataract, left eye: Secondary | ICD-10-CM | POA: Diagnosis not present

## 2021-02-22 ENCOUNTER — Other Ambulatory Visit (INDEPENDENT_AMBULATORY_CARE_PROVIDER_SITE_OTHER): Payer: Self-pay | Admitting: Internal Medicine

## 2021-02-22 DIAGNOSIS — E1165 Type 2 diabetes mellitus with hyperglycemia: Secondary | ICD-10-CM

## 2021-03-01 ENCOUNTER — Encounter (INDEPENDENT_AMBULATORY_CARE_PROVIDER_SITE_OTHER): Payer: Self-pay | Admitting: Internal Medicine

## 2021-03-01 ENCOUNTER — Other Ambulatory Visit: Payer: Self-pay

## 2021-03-01 ENCOUNTER — Ambulatory Visit (INDEPENDENT_AMBULATORY_CARE_PROVIDER_SITE_OTHER): Payer: Medicare HMO | Admitting: Internal Medicine

## 2021-03-01 VITALS — BP 142/80 | HR 77 | Temp 97.8°F | Ht 67.25 in | Wt 173.8 lb

## 2021-03-01 DIAGNOSIS — G63 Polyneuropathy in diseases classified elsewhere: Secondary | ICD-10-CM

## 2021-03-01 DIAGNOSIS — I10 Essential (primary) hypertension: Secondary | ICD-10-CM

## 2021-03-01 DIAGNOSIS — E1165 Type 2 diabetes mellitus with hyperglycemia: Secondary | ICD-10-CM

## 2021-03-01 NOTE — Progress Notes (Signed)
Metrics: Intervention Frequency ACO  Documented Smoking Status Yearly  Screened one or more times in 24 months  Cessation Counseling or  Active cessation medication Past 24 months  Past 24 months   Guideline developer: UpToDate (See UpToDate for funding source) Date Released: 2014       Wellness Office Visit  Subjective:  Patient ID: Collin Yoder, male    DOB: 1951-03-11  Age: 70 y.o. MRN: [id number]  CC: This man comes in for follow-up of hypertension, uncontrolled diabetes, polyneuropathy. HPI His metformin has been increased so that he is taking 1000 mg in the morning and 500 mg in the evening. He recently had cataract surgery this month and he feels so much better with eyesight now.  He is very thankful to have the surgery done. He continues on losartan for hypertension and is tolerating it well. His peripheral neuropathy does not appear to be a major issue at the present time and he takes gabapentin as prescribed without significant pain.  Past Medical History:  Diagnosis Date  . Borderline diabetes   . Peripheral neuropathy 07/15/2019  . Right lower lobe lung mass 04/11/2017  . Thalamic stroke ([place]) 07/15/2019  . Type II diabetes mellitus, uncontrolled ([place]) 07/15/2019  . Vitamin D deficiency disease 07/15/2019   Past Surgical History:  Procedure Laterality Date  . APPENDECTOMY    . CATARACT EXTRACTION, BILATERAL Bilateral 01/2021     Family History  Problem Relation Age of Onset  . Diabetes Mother 80  . Stroke Mother   . Colon cancer Neg Hx     Social History   Social History Narrative   Single.Lives alone in a motel.Works at [employer].   Social History   Tobacco Use  . Smoking status: Current Every Day Smoker    Packs/day: 1.00    Years: 45.00    Pack years: 45.00    Types: Cigarettes  . Smokeless tobacco: Never Used  Substance Use Topics  . Alcohol use: Yes    Alcohol/week: 1.0 standard drink    Types: 1 Cans of beer per week     Comment: every 3 days    Current Meds  Medication Sig  . Alcohol Swabs (B-D SINGLE USE SWABS REGULAR) PADS 1 Units by Does not apply route daily at 12 noon.  Marland Kitchen aspirin 81 MG chewable tablet Chew 81 mg by mouth daily.  . blood glucose meter kit and supplies KIT Dispense based on patient and insurance preference. Use up to four times daily as directed. (FOR ICD-9 250.00, 250.01).  . Blood Glucose Monitoring Suppl (TRUE METRIX METER) w/Device KIT USE AS DIRECTED  . Cholecalciferol (VITAMIN D3) 5000 units CAPS Take 1 capsule by mouth daily.  Marland Kitchen gabapentin (NEURONTIN) 100 MG capsule Take 2 capsules (200 mg total) by mouth 3 (three) times daily.  Marland Kitchen glucose blood (TRUE METRIX BLOOD GLUCOSE TEST) test strip Use as instructed  . ketorolac (ACULAR) 0.5 % ophthalmic solution 1 drop 3 (three) times daily.  Marland Kitchen losartan (COZAAR) 50 MG tablet Take 1 tablet (50 mg total) by mouth daily.  . metFORMIN (GLUCOPHAGE) 500 MG tablet TAKE (1) TABLET BY MOUTH TWICE DAILY WITH A MEAL. (Patient taking differently: 500 mg. TAKE 2 TABLETS IN MORNING AND 1 TABLET IN THE EVENING)  . ofloxacin (OCUFLOX) 0.3 % ophthalmic solution 1 drop 3 (three) times daily.  . prednisoLONE acetate (PRED FORTE) 1 % ophthalmic suspension 1 drop 3 (three) times daily.  . TRUEplus Lancets 33G MISC 1 Units by Does not  apply route daily at 12 noon.     [place] Office Visit from 03/01/2021 in [facility]  PHQ-9 Total Score 0      Objective:   Today's Vitals: BP (!) 142/80   Pulse 77   Temp 97.8 F (36.6 C) (Temporal)   Ht 5' 7.25" (1.708 m)   Wt 173 lb 12.8 oz (78.8 kg)   SpO2 97%   BMI 27.02 kg/m  Vitals with BMI 03/01/2021 01/06/2021 12/21/2020  Height 5' 7.25" 5' 7.25" 5' 9"   Weight 173 lbs 13 oz 173 lbs 13 oz 181 lbs  BMI 27.02 35.68 61.68  Systolic 372 902 111  Diastolic 80 82 78  Pulse 77 79 88     Physical Exam   He looks systemically well.  Blood pressure systolic slightly higher today but has been  previously better.    Assessment   1. Uncontrolled type 2 diabetes mellitus with hyperglycemia ([place])   2. Hypertension, unspecified type   3. Polyneuropathy associated with underlying disease ([place])       Tests ordered Orders Placed This Encounter  Procedures  . COMPLETE METABOLIC PANEL WITH GFR  . Hemoglobin A1c     Plan: 1. Continue with metformin as prescribed.  I will check an A1c. 2. Continue with losartan as before.  We will check renal function. 3. I did offer colonoscopy as he has never had one and he will think about it. 4. Follow-up with Judson Roch for an annual physical exam in 3 months.   No orders of the defined types were placed in this encounter.   Doree Albee, MD

## 2021-03-02 LAB — COMPLETE METABOLIC PANEL WITH GFR
AG Ratio: 1.5 (calc) (ref 1.0–2.5)
ALT: 12 U/L (ref 9–46)
AST: 11 U/L (ref 10–35)
Albumin: 4.6 g/dL (ref 3.6–5.1)
Alkaline phosphatase (APISO): 54 U/L (ref 35–144)
BUN: 17 mg/dL (ref 7–25)
CO2: 27 mmol/L (ref 20–32)
Calcium: 9.9 mg/dL (ref 8.6–10.3)
Chloride: 102 mmol/L (ref 98–110)
Creat: 1.09 mg/dL (ref 0.70–1.18)
GFR, Est African American: 79 mL/min/{1.73_m2} (ref >=60)
GFR, Est Non African American: 68 mL/min/{1.73_m2} (ref >=60)
Globulin: 3 g/dL (calc) (ref 1.9–3.7)
Glucose, Bld: 129 mg/dL — ABNORMAL HIGH (ref 65–99)
Potassium: 5 mmol/L (ref 3.5–5.3)
Sodium: 138 mmol/L (ref 135–146)
Total Bilirubin: 0.7 mg/dL (ref 0.2–1.2)
Total Protein: 7.6 g/dL (ref 6.1–8.1)

## 2021-03-02 LAB — HEMOGLOBIN A1C
Hgb A1c MFr Bld: 6.3 % of total Hgb — ABNORMAL HIGH (ref <5.7)
Mean Plasma Glucose: 134 mg/dL
eAG (mmol/L): 7.4 mmol/L

## 2021-03-02 NOTE — Progress Notes (Signed)
Please call this patient and let him know that his diabetes is much improved.  Continue with the same metformin dose that he is currently taking.  Follow-up as scheduled with Judson Roch.

## 2021-03-29 ENCOUNTER — Other Ambulatory Visit (INDEPENDENT_AMBULATORY_CARE_PROVIDER_SITE_OTHER): Payer: Self-pay | Admitting: Nurse Practitioner

## 2021-03-29 DIAGNOSIS — I10 Essential (primary) hypertension: Secondary | ICD-10-CM

## 2021-04-05 ENCOUNTER — Telehealth (INDEPENDENT_AMBULATORY_CARE_PROVIDER_SITE_OTHER): Payer: Self-pay

## 2021-04-05 DIAGNOSIS — G63 Polyneuropathy in diseases classified elsewhere: Secondary | ICD-10-CM

## 2021-04-05 DIAGNOSIS — E1165 Type 2 diabetes mellitus with hyperglycemia: Secondary | ICD-10-CM

## 2021-04-05 MED ORDER — GABAPENTIN 100 MG PO CAPS: 200.0000 mg | ORAL_CAPSULE | Freq: Three times a day (TID) | ORAL | 0 refills | Status: DC

## 2021-04-05 MED ORDER — METFORMIN HCL 500 MG PO TABS: ORAL_TABLET | ORAL | 0 refills | Status: DC

## 2021-04-05 NOTE — Telephone Encounter (Signed)
Orders approved and sent to [facility]

## 2021-04-05 NOTE — Telephone Encounter (Signed)
Patient called and stated that he needs a refill of the following medications sent to [facility]:  gabapentin (NEURONTIN) 100 MG capsule  Last filled 01/06/2021, # 24 with 0 refills  metFORMIN (GLUCOPHAGE) 500 MG tablet Last filled 01/08/2021, needs 270 please   Patient stated that he needs to correct dose of the Metformin, 3 pills daily for 270 pills for 3 month supply, he received the wrong instructions and quantity last time his prescription was sent in for the Metformin  Last OV 03/01/2021  Next OV 06/02/2021

## 2021-04-14 DIAGNOSIS — H524 Presbyopia: Secondary | ICD-10-CM | POA: Diagnosis not present

## 2021-04-14 DIAGNOSIS — Z0100 Encounter for examination of eyes and vision without abnormal findings: Secondary | ICD-10-CM | POA: Diagnosis not present

## 2021-05-05 ENCOUNTER — Encounter (INDEPENDENT_AMBULATORY_CARE_PROVIDER_SITE_OTHER): Payer: Self-pay

## 2021-05-23 ENCOUNTER — Telehealth (INDEPENDENT_AMBULATORY_CARE_PROVIDER_SITE_OTHER): Payer: Self-pay

## 2021-05-23 DIAGNOSIS — E1165 Type 2 diabetes mellitus with hyperglycemia: Secondary | ICD-10-CM

## 2021-05-23 DIAGNOSIS — I10 Essential (primary) hypertension: Secondary | ICD-10-CM

## 2021-05-23 DIAGNOSIS — G63 Polyneuropathy in diseases classified elsewhere: Secondary | ICD-10-CM

## 2021-05-23 MED ORDER — GABAPENTIN 100 MG PO CAPS: 200.0000 mg | ORAL_CAPSULE | Freq: Three times a day (TID) | ORAL | 2 refills | Status: DC

## 2021-05-23 MED ORDER — LOSARTAN POTASSIUM 50 MG PO TABS: 50.0000 mg | ORAL_TABLET | Freq: Every day | ORAL | 0 refills | Status: DC

## 2021-05-23 MED ORDER — METFORMIN HCL 500 MG PO TABS: ORAL_TABLET | ORAL | 0 refills | Status: DC

## 2021-05-23 NOTE — Telephone Encounter (Signed)
Patient called and requested a refill of the following medications to be sent to [facility] until he can find a new primary care doctor:  metFORMIN (GLUCOPHAGE) 500 MG tablet  Last filled 04/05/2021, # 270 with 0 refills  gabapentin (NEURONTIN) 100 MG capsule  Last filled 04/05/2021, # 540 with 0 refills  losartan (COZAAR) 50 MG tablet  Last filled 03/29/2021, # 90 with 0 refills

## 2021-05-23 NOTE — Telephone Encounter (Signed)
Refills approved and sent to [facility].  

## 2021-06-01 ENCOUNTER — Encounter (INDEPENDENT_AMBULATORY_CARE_PROVIDER_SITE_OTHER): Payer: Medicare HMO | Admitting: Internal Medicine

## 2021-06-02 ENCOUNTER — Encounter (INDEPENDENT_AMBULATORY_CARE_PROVIDER_SITE_OTHER): Payer: Medicare HMO | Admitting: Nurse Practitioner

## 2021-07-11 DIAGNOSIS — Z961 Presence of intraocular lens: Secondary | ICD-10-CM | POA: Diagnosis not present

## 2021-07-11 DIAGNOSIS — H35351 Cystoid macular degeneration, right eye: Secondary | ICD-10-CM | POA: Diagnosis not present

## 2021-07-13 DIAGNOSIS — Z8673 Personal history of transient ischemic attack (TIA), and cerebral infarction without residual deficits: Secondary | ICD-10-CM | POA: Diagnosis not present

## 2021-07-13 DIAGNOSIS — I10 Essential (primary) hypertension: Secondary | ICD-10-CM | POA: Diagnosis not present

## 2021-07-13 DIAGNOSIS — E559 Vitamin D deficiency, unspecified: Secondary | ICD-10-CM | POA: Diagnosis not present

## 2021-07-13 DIAGNOSIS — E119 Type 2 diabetes mellitus without complications: Secondary | ICD-10-CM | POA: Diagnosis not present

## 2021-07-13 DIAGNOSIS — Z125 Encounter for screening for malignant neoplasm of prostate: Secondary | ICD-10-CM | POA: Diagnosis not present

## 2021-07-13 DIAGNOSIS — F17200 Nicotine dependence, unspecified, uncomplicated: Secondary | ICD-10-CM | POA: Diagnosis not present

## 2021-07-13 DIAGNOSIS — I639 Cerebral infarction, unspecified: Secondary | ICD-10-CM | POA: Diagnosis not present

## 2021-07-13 DIAGNOSIS — Z1329 Encounter for screening for other suspected endocrine disorder: Secondary | ICD-10-CM | POA: Diagnosis not present

## 2021-08-02 ENCOUNTER — Emergency Department (HOSPITAL_COMMUNITY)
Admission: EM | Admit: 2021-08-02 | Discharge: 2021-08-02 | Disposition: A | Discharge status: Home / Self Care | Payer: Medicare HMO | Source: Home / Self Care

## 2021-08-02 ENCOUNTER — Emergency Department (HOSPITAL_COMMUNITY): Payer: Medicare HMO

## 2021-08-02 ENCOUNTER — Encounter (HOSPITAL_COMMUNITY): Payer: Self-pay | Admitting: *Deleted

## 2021-08-02 DIAGNOSIS — Z823 Family history of stroke: Secondary | ICD-10-CM | POA: Diagnosis not present

## 2021-08-02 DIAGNOSIS — Z87891 Personal history of nicotine dependence: Secondary | ICD-10-CM | POA: Diagnosis not present

## 2021-08-02 DIAGNOSIS — Z79899 Other long term (current) drug therapy: Secondary | ICD-10-CM | POA: Diagnosis not present

## 2021-08-02 DIAGNOSIS — L989 Disorder of the skin and subcutaneous tissue, unspecified: Secondary | ICD-10-CM | POA: Insufficient documentation

## 2021-08-02 DIAGNOSIS — Z5321 Procedure and treatment not carried out due to patient leaving prior to being seen by health care provider: Secondary | ICD-10-CM | POA: Insufficient documentation

## 2021-08-02 DIAGNOSIS — R7303 Prediabetes: Secondary | ICD-10-CM | POA: Diagnosis present

## 2021-08-02 DIAGNOSIS — I10 Essential (primary) hypertension: Secondary | ICD-10-CM | POA: Diagnosis not present

## 2021-08-02 DIAGNOSIS — E11628 Type 2 diabetes mellitus with other skin complications: Secondary | ICD-10-CM | POA: Diagnosis not present

## 2021-08-02 DIAGNOSIS — H35033 Hypertensive retinopathy, bilateral: Secondary | ICD-10-CM | POA: Diagnosis not present

## 2021-08-02 DIAGNOSIS — Z7984 Long term (current) use of oral hypoglycemic drugs: Secondary | ICD-10-CM | POA: Diagnosis not present

## 2021-08-02 DIAGNOSIS — E785 Hyperlipidemia, unspecified: Secondary | ICD-10-CM | POA: Diagnosis present

## 2021-08-02 DIAGNOSIS — Z8673 Personal history of transient ischemic attack (TIA), and cerebral infarction without residual deficits: Secondary | ICD-10-CM | POA: Diagnosis not present

## 2021-08-02 DIAGNOSIS — B9561 Methicillin susceptible Staphylococcus aureus infection as the cause of diseases classified elsewhere: Secondary | ICD-10-CM | POA: Diagnosis not present

## 2021-08-02 DIAGNOSIS — F17210 Nicotine dependence, cigarettes, uncomplicated: Secondary | ICD-10-CM | POA: Diagnosis present

## 2021-08-02 DIAGNOSIS — Z7982 Long term (current) use of aspirin: Secondary | ICD-10-CM | POA: Diagnosis not present

## 2021-08-02 DIAGNOSIS — H43823 Vitreomacular adhesion, bilateral: Secondary | ICD-10-CM | POA: Diagnosis not present

## 2021-08-02 DIAGNOSIS — E1169 Type 2 diabetes mellitus with other specified complication: Secondary | ICD-10-CM | POA: Diagnosis not present

## 2021-08-02 DIAGNOSIS — L84 Corns and callosities: Secondary | ICD-10-CM | POA: Diagnosis present

## 2021-08-02 DIAGNOSIS — E11621 Type 2 diabetes mellitus with foot ulcer: Secondary | ICD-10-CM | POA: Diagnosis not present

## 2021-08-02 DIAGNOSIS — Z872 Personal history of diseases of the skin and subcutaneous tissue: Secondary | ICD-10-CM | POA: Diagnosis not present

## 2021-08-02 DIAGNOSIS — E1142 Type 2 diabetes mellitus with diabetic polyneuropathy: Secondary | ICD-10-CM | POA: Diagnosis not present

## 2021-08-02 DIAGNOSIS — S91301A Unspecified open wound, right foot, initial encounter: Secondary | ICD-10-CM | POA: Diagnosis not present

## 2021-08-02 DIAGNOSIS — Z20822 Contact with and (suspected) exposure to covid-19: Secondary | ICD-10-CM | POA: Diagnosis not present

## 2021-08-02 DIAGNOSIS — E559 Vitamin D deficiency, unspecified: Secondary | ICD-10-CM | POA: Diagnosis present

## 2021-08-02 DIAGNOSIS — L03115 Cellulitis of right lower limb: Secondary | ICD-10-CM | POA: Diagnosis not present

## 2021-08-02 DIAGNOSIS — Z9842 Cataract extraction status, left eye: Secondary | ICD-10-CM | POA: Diagnosis not present

## 2021-08-02 DIAGNOSIS — M7731 Calcaneal spur, right foot: Secondary | ICD-10-CM | POA: Diagnosis not present

## 2021-08-02 DIAGNOSIS — N179 Acute kidney failure, unspecified: Secondary | ICD-10-CM | POA: Diagnosis not present

## 2021-08-02 DIAGNOSIS — Z833 Family history of diabetes mellitus: Secondary | ICD-10-CM | POA: Diagnosis not present

## 2021-08-02 DIAGNOSIS — E113393 Type 2 diabetes mellitus with moderate nonproliferative diabetic retinopathy without macular edema, bilateral: Secondary | ICD-10-CM | POA: Diagnosis not present

## 2021-08-02 DIAGNOSIS — L97519 Non-pressure chronic ulcer of other part of right foot with unspecified severity: Secondary | ICD-10-CM | POA: Diagnosis not present

## 2021-08-02 DIAGNOSIS — Z5329 Procedure and treatment not carried out because of patient's decision for other reasons: Secondary | ICD-10-CM | POA: Diagnosis present

## 2021-08-02 DIAGNOSIS — Z9841 Cataract extraction status, right eye: Secondary | ICD-10-CM | POA: Diagnosis not present

## 2021-08-02 NOTE — ED Notes (Signed)
Pt ambulatory. Standing in doorway.

## 2021-08-02 NOTE — ED Notes (Signed)
Pt not visualized in room.

## 2021-08-02 NOTE — ED Triage Notes (Signed)
Lesion on right foot onset a month ago

## 2021-08-03 ENCOUNTER — Emergency Department (HOSPITAL_COMMUNITY)
Admission: EM | Admit: 2021-08-03 | Discharge: 2021-08-03 | Disposition: A | Discharge status: Home / Self Care | Payer: Medicare HMO | Source: Home / Self Care | Attending: Emergency Medicine | Admitting: Emergency Medicine

## 2021-08-03 ENCOUNTER — Other Ambulatory Visit: Payer: Self-pay

## 2021-08-03 ENCOUNTER — Encounter (HOSPITAL_COMMUNITY): Payer: Self-pay

## 2021-08-03 DIAGNOSIS — F17210 Nicotine dependence, cigarettes, uncomplicated: Secondary | ICD-10-CM | POA: Insufficient documentation

## 2021-08-03 DIAGNOSIS — Z7982 Long term (current) use of aspirin: Secondary | ICD-10-CM | POA: Insufficient documentation

## 2021-08-03 DIAGNOSIS — Z7984 Long term (current) use of oral hypoglycemic drugs: Secondary | ICD-10-CM | POA: Insufficient documentation

## 2021-08-03 DIAGNOSIS — I10 Essential (primary) hypertension: Secondary | ICD-10-CM | POA: Insufficient documentation

## 2021-08-03 DIAGNOSIS — E1142 Type 2 diabetes mellitus with diabetic polyneuropathy: Secondary | ICD-10-CM | POA: Insufficient documentation

## 2021-08-03 DIAGNOSIS — L03115 Cellulitis of right lower limb: Secondary | ICD-10-CM | POA: Insufficient documentation

## 2021-08-03 DIAGNOSIS — N179 Acute kidney failure, unspecified: Secondary | ICD-10-CM | POA: Insufficient documentation

## 2021-08-03 DIAGNOSIS — Z79899 Other long term (current) drug therapy: Secondary | ICD-10-CM | POA: Insufficient documentation

## 2021-08-03 LAB — COMPREHENSIVE METABOLIC PANEL
ALT: 10 U/L (ref 0–44)
AST: 12 U/L — ABNORMAL LOW (ref 15–41)
Albumin: 3.9 g/dL (ref 3.5–5.0)
Alkaline Phosphatase: 55 U/L (ref 38–126)
Anion gap: 8 (ref 5–15)
BUN: 48 mg/dL — ABNORMAL HIGH (ref 8–23)
CO2: 24 mmol/L (ref 22–32)
Calcium: 9.1 mg/dL (ref 8.9–10.3)
Chloride: 103 mmol/L (ref 98–111)
Creatinine, Ser: 2.98 mg/dL — ABNORMAL HIGH (ref 0.61–1.24)
GFR, Estimated: 22 mL/min — ABNORMAL LOW (ref >60)
Glucose, Bld: 152 mg/dL — ABNORMAL HIGH (ref 70–99)
Potassium: 4.4 mmol/L (ref 3.5–5.1)
Sodium: 135 mmol/L (ref 135–145)
Total Bilirubin: 0.6 mg/dL (ref 0.3–1.2)
Total Protein: 7.7 g/dL (ref 6.5–8.1)

## 2021-08-03 LAB — CBC WITH DIFFERENTIAL/PLATELET
Abs Immature Granulocytes: 0.06 K/uL (ref 0.00–0.07)
Basophils Absolute: 0.1 K/uL (ref 0.0–0.1)
Basophils Relative: 1 %
Eosinophils Absolute: 0.3 K/uL (ref 0.0–0.5)
Eosinophils Relative: 3 %
HCT: 29 % — ABNORMAL LOW (ref 39.0–52.0)
Hemoglobin: 9.6 g/dL — ABNORMAL LOW (ref 13.0–17.0)
Immature Granulocytes: 1 %
Lymphocytes Relative: 12 %
Lymphs Abs: 1.2 K/uL (ref 0.7–4.0)
MCH: 31.3 pg (ref 26.0–34.0)
MCHC: 33.1 g/dL (ref 30.0–36.0)
MCV: 94.5 fL (ref 80.0–100.0)
Monocytes Absolute: 1.1 K/uL — ABNORMAL HIGH (ref 0.1–1.0)
Monocytes Relative: 11 %
Neutro Abs: 7.2 K/uL (ref 1.7–7.7)
Neutrophils Relative %: 72 %
Platelets: 205 K/uL (ref 150–400)
RBC: 3.07 MIL/uL — ABNORMAL LOW (ref 4.22–5.81)
RDW: 12 % (ref 11.5–15.5)
WBC: 10 K/uL (ref 4.0–10.5)
nRBC: 0 % (ref 0.0–0.2)

## 2021-08-03 MED ORDER — VANCOMYCIN HCL IN DEXTROSE 1-5 GM/200ML-% IV SOLN
1000.0000 mg | Freq: Once | INTRAVENOUS | Status: AC
  Administered 2021-08-03: 1000 mg via INTRAVENOUS
  Filled 2021-08-03: qty 200

## 2021-08-03 NOTE — Discharge Instructions (Signed)
Return as soon as possible

## 2021-08-03 NOTE — ED Provider Notes (Signed)
[facility] EMERGENCY DEPARTMENT Provider Note   CSN: [id number] Arrival date & time: 08/03/21  7124     History Chief Complaint  Patient presents with   Foot Pain    Collin Yoder is a 70 y.o. male.  Patient complains of swelling and redness to his right foot.  Patient is diabetic  The history is provided by the patient and medical records. No language interpreter was used.  Foot Pain This is a new problem. The problem occurs constantly. The problem has not changed since onset.Pertinent negatives include no chest pain, no abdominal pain and no headaches. Nothing aggravates the symptoms. Nothing relieves the symptoms. He has tried nothing for the symptoms.      Past Medical History:  Diagnosis Date   Borderline diabetes    Peripheral neuropathy 07/15/2019   Right lower lobe lung mass 04/11/2017   Thalamic stroke ([place]) 07/15/2019   Type II diabetes mellitus, uncontrolled 07/15/2019   Vitamin D deficiency disease 07/15/2019    Patient Active Problem List   Diagnosis Date Noted   Hypertension 08/03/2020   Hyperlipidemia 08/03/2020   Cigarette nicotine dependence without complication 58/06/9832   Vitamin D deficiency disease 07/15/2019   Peripheral neuropathy 07/15/2019   Thalamic stroke ([place]) 07/15/2019   Type II diabetes mellitus, uncontrolled 07/15/2019   Pulmonary nodule, right 12/04/2017   Cough 12/04/2017   Abnormal weight loss 12/04/2017   Abdominal pain 12/04/2017   Right lower lobe lung mass 04/11/2017    Past Surgical History:  Procedure Laterality Date   APPENDECTOMY     CATARACT EXTRACTION, BILATERAL Bilateral 01/2021       Family History  Problem Relation Age of Onset   Diabetes Mother 30   Stroke Mother    Colon cancer Neg Hx     Social History   Tobacco Use   Smoking status: Every Day    Packs/day: 1.00    Years: 45.00    Pack years: 45.00    Types: Cigarettes   Smokeless tobacco: Never  Vaping Use   Vaping Use: Never used   Substance Use Topics   Alcohol use: Yes    Alcohol/week: 1.0 standard drink    Types: 1 Cans of beer per week    Comment: every 3 days   Drug use: No    Home Medications Prior to Admission medications   Medication Sig Start Date End Date Taking? Authorizing Provider  Alcohol Swabs (B-D SINGLE USE SWABS REGULAR) PADS 1 Units by Does not apply route daily at 12 noon. 12/15/20   Doree Albee, MD  aspirin 81 MG chewable tablet Chew 81 mg by mouth daily.    [provider]  blood glucose meter kit and supplies KIT Dispense based on patient and insurance preference. Use up to four times daily as directed. (FOR ICD-9 250.00, 250.01). 08/03/20   Ailene Ards, NP  Blood Glucose Monitoring Suppl (TRUE METRIX METER) w/Device KIT USE AS DIRECTED 02/22/21   Doree Albee, MD  Cholecalciferol (VITAMIN D3) 5000 units CAPS Take 1 capsule by mouth daily.    [provider]  gabapentin (NEURONTIN) 100 MG capsule Take 2 capsules (200 mg total) by mouth 3 (three) times daily. 05/23/21   Ailene Ards, NP  glucose blood (TRUE METRIX BLOOD GLUCOSE TEST) test strip Use as instructed 12/15/20   Doree Albee, MD  ketorolac (ACULAR) 0.5 % ophthalmic solution 1 drop 3 (three) times daily. 02/09/21   [provider]  losartan (COZAAR) 50  MG tablet Take 1 tablet (50 mg total) by mouth daily. 05/23/21   Ailene Ards, NP  metFORMIN (GLUCOPHAGE) 500 MG tablet TAKE 2 TABLETS by mouth IN MORNING AND 1 TABLET by mouth IN THE EVENING 05/23/21   Ailene Ards, NP  ofloxacin (OCUFLOX) 0.3 % ophthalmic solution 1 drop 3 (three) times daily. 02/09/21   [provider]  prednisoLONE acetate (PRED FORTE) 1 % ophthalmic suspension 1 drop 3 (three) times daily. 02/09/21   [provider]  TRUEplus Lancets 33G MISC 1 Units by Does not apply route daily at 12 noon. 12/15/20   Doree Albee, MD    Allergies    Patient has no known allergies.  Review of Systems   Review of  Systems  Constitutional:  Negative for appetite change and fatigue.  HENT:  Negative for congestion, ear discharge and sinus pressure.   Eyes:  Negative for discharge.  Respiratory:  Negative for cough.   Cardiovascular:  Negative for chest pain.  Gastrointestinal:  Negative for abdominal pain and diarrhea.  Genitourinary:  Negative for frequency and hematuria.  Musculoskeletal:  Negative for back pain.       Right foot pain  Skin:  Negative for rash.  Neurological:  Negative for seizures and headaches.  Psychiatric/Behavioral:  Negative for hallucinations.    Physical Exam Updated Vital Signs BP 126/65   Pulse 81   Temp 98.5 F (36.9 C) (Oral)   Resp 18   Ht 5' 7.25" (1.708 m)   Wt 81.9 kg   SpO2 99%   BMI 28.08 kg/m   Physical Exam Vitals and nursing note reviewed.  Constitutional:      Appearance: He is well-developed.  HENT:     Head: Normocephalic.     Nose: Nose normal.  Eyes:     General: No scleral icterus.    Conjunctiva/sclera: Conjunctivae normal.  Neck:     Thyroid: No thyromegaly.  Cardiovascular:     Rate and Rhythm: Normal rate and regular rhythm.     Heart sounds: No murmur heard.   No friction rub. No gallop.  Pulmonary:     Breath sounds: No stridor. No wheezing or rales.  Chest:     Chest wall: No tenderness.  Abdominal:     General: There is no distension.     Tenderness: There is no abdominal tenderness. There is no rebound.  Musculoskeletal:        General: Normal range of motion.     Cervical back: Neck supple.     Comments: Swollen red tender right foot  Lymphadenopathy:     Cervical: No cervical adenopathy.  Skin:    Findings: No erythema or rash.  Neurological:     Mental Status: He is alert and oriented to person, place, and time.     Motor: No abnormal muscle tone.     Coordination: Coordination normal.  Psychiatric:        Behavior: Behavior normal.    ED Results / Procedures / Treatments   Labs (all labs ordered are  listed, but only abnormal results are displayed) Labs Reviewed  CBC WITH DIFFERENTIAL/PLATELET - Abnormal; Notable for the following components:      Result Value   RBC 3.07 (*)    Hemoglobin 9.6 (*)    HCT 29.0 (*)    Monocytes Absolute 1.1 (*)    All other components within normal limits  COMPREHENSIVE METABOLIC PANEL - Abnormal; Notable for the following components:   Glucose,  Bld 152 (*)    BUN 48 (*)    Creatinine, Ser 2.98 (*)    AST 12 (*)    GFR, Estimated 22 (*)    All other components within normal limits    EKG None  Radiology DG Foot Complete Right  Result Date: 08/02/2021 CLINICAL DATA:  Right foot wound near 5th MTP EXAM: RIGHT FOOT COMPLETE - 3+ VIEW COMPARISON:  None. FINDINGS: No acute bony abnormality. Specifically, no fracture, subluxation, or dislocation. No bone destruction to suggest osteomyelitis. No soft tissue gas. IMPRESSION: No acute bony abnormality. Electronically Signed   By: Rolm Baptise M.D.   On: 08/02/2021 18:11    Procedures Procedures   Medications Ordered in ED Medications  vancomycin (VANCOCIN) IVPB 1000 mg/200 mL premix (0 mg Intravenous Stopped 08/03/21 0916)    ED Course  I have reviewed the triage vital signs and the nursing notes.  Pertinent labs & imaging results that were available during my care of the patient were reviewed by me and considered in my medical decision making (see chart for details).    MDM Rules/Calculators/A&P                           Patient has a significant cellulitis to the right foot and he has AN AKI.  I told him he needed to be admitted to the hospital for his infection and his kidney failure.  He stated he had too many things to do today so he left AMA and may return in the next 24 hours Final Clinical Impression(s) / ED Diagnoses Final diagnoses:  None    Rx / DC Orders ED Discharge Orders     None        Milton Ferguson, MD 08/03/21 [phone]

## 2021-08-03 NOTE — ED Triage Notes (Signed)
Pt presents to ED with right foot pain, redness, and blister started 3 days ago. Pt diabetic and states last CBG after eating was 140.

## 2021-08-04 ENCOUNTER — Encounter (HOSPITAL_COMMUNITY): Payer: Self-pay | Admitting: *Deleted

## 2021-08-04 ENCOUNTER — Inpatient Hospital Stay (HOSPITAL_COMMUNITY)
Admission: EM | Admit: 2021-08-04 | Discharge: 2021-08-07 | DRG: 638 | Disposition: A | Discharge status: Home / Self Care | Payer: Medicare HMO | Attending: Family Medicine | Admitting: Family Medicine

## 2021-08-04 ENCOUNTER — Inpatient Hospital Stay (HOSPITAL_COMMUNITY): Payer: Medicare HMO

## 2021-08-04 ENCOUNTER — Other Ambulatory Visit: Payer: Self-pay

## 2021-08-04 DIAGNOSIS — Z9842 Cataract extraction status, left eye: Secondary | ICD-10-CM | POA: Diagnosis not present

## 2021-08-04 DIAGNOSIS — S91301A Unspecified open wound, right foot, initial encounter: Secondary | ICD-10-CM | POA: Diagnosis not present

## 2021-08-04 DIAGNOSIS — E11628 Type 2 diabetes mellitus with other skin complications: Principal | ICD-10-CM | POA: Diagnosis present

## 2021-08-04 DIAGNOSIS — R7303 Prediabetes: Secondary | ICD-10-CM | POA: Diagnosis present

## 2021-08-04 DIAGNOSIS — Z9841 Cataract extraction status, right eye: Secondary | ICD-10-CM | POA: Diagnosis not present

## 2021-08-04 DIAGNOSIS — E1142 Type 2 diabetes mellitus with diabetic polyneuropathy: Secondary | ICD-10-CM | POA: Diagnosis present

## 2021-08-04 DIAGNOSIS — Z5329 Procedure and treatment not carried out because of patient's decision for other reasons: Secondary | ICD-10-CM | POA: Diagnosis present

## 2021-08-04 DIAGNOSIS — Z87891 Personal history of nicotine dependence: Secondary | ICD-10-CM | POA: Diagnosis not present

## 2021-08-04 DIAGNOSIS — Z823 Family history of stroke: Secondary | ICD-10-CM | POA: Diagnosis not present

## 2021-08-04 DIAGNOSIS — Z7984 Long term (current) use of oral hypoglycemic drugs: Secondary | ICD-10-CM | POA: Diagnosis not present

## 2021-08-04 DIAGNOSIS — N179 Acute kidney failure, unspecified: Secondary | ICD-10-CM

## 2021-08-04 DIAGNOSIS — Z79899 Other long term (current) drug therapy: Secondary | ICD-10-CM

## 2021-08-04 DIAGNOSIS — L03115 Cellulitis of right lower limb: Secondary | ICD-10-CM | POA: Diagnosis present

## 2021-08-04 DIAGNOSIS — B9561 Methicillin susceptible Staphylococcus aureus infection as the cause of diseases classified elsewhere: Secondary | ICD-10-CM | POA: Diagnosis present

## 2021-08-04 DIAGNOSIS — M7731 Calcaneal spur, right foot: Secondary | ICD-10-CM | POA: Diagnosis not present

## 2021-08-04 DIAGNOSIS — Z20822 Contact with and (suspected) exposure to covid-19: Secondary | ICD-10-CM | POA: Diagnosis present

## 2021-08-04 DIAGNOSIS — E785 Hyperlipidemia, unspecified: Secondary | ICD-10-CM | POA: Diagnosis present

## 2021-08-04 DIAGNOSIS — Z833 Family history of diabetes mellitus: Secondary | ICD-10-CM

## 2021-08-04 DIAGNOSIS — Z8673 Personal history of transient ischemic attack (TIA), and cerebral infarction without residual deficits: Secondary | ICD-10-CM | POA: Diagnosis not present

## 2021-08-04 DIAGNOSIS — E559 Vitamin D deficiency, unspecified: Secondary | ICD-10-CM | POA: Diagnosis present

## 2021-08-04 DIAGNOSIS — E119 Type 2 diabetes mellitus without complications: Secondary | ICD-10-CM

## 2021-08-04 DIAGNOSIS — Z872 Personal history of diseases of the skin and subcutaneous tissue: Secondary | ICD-10-CM | POA: Diagnosis not present

## 2021-08-04 DIAGNOSIS — E1165 Type 2 diabetes mellitus with hyperglycemia: Secondary | ICD-10-CM

## 2021-08-04 DIAGNOSIS — E11621 Type 2 diabetes mellitus with foot ulcer: Secondary | ICD-10-CM | POA: Diagnosis present

## 2021-08-04 DIAGNOSIS — L84 Corns and callosities: Secondary | ICD-10-CM | POA: Diagnosis present

## 2021-08-04 DIAGNOSIS — L97519 Non-pressure chronic ulcer of other part of right foot with unspecified severity: Secondary | ICD-10-CM | POA: Diagnosis present

## 2021-08-04 DIAGNOSIS — I10 Essential (primary) hypertension: Secondary | ICD-10-CM | POA: Diagnosis present

## 2021-08-04 DIAGNOSIS — Z7982 Long term (current) use of aspirin: Secondary | ICD-10-CM | POA: Diagnosis not present

## 2021-08-04 DIAGNOSIS — F17210 Nicotine dependence, cigarettes, uncomplicated: Secondary | ICD-10-CM | POA: Diagnosis present

## 2021-08-04 DIAGNOSIS — E1169 Type 2 diabetes mellitus with other specified complication: Secondary | ICD-10-CM | POA: Diagnosis not present

## 2021-08-04 DIAGNOSIS — L089 Local infection of the skin and subcutaneous tissue, unspecified: Secondary | ICD-10-CM

## 2021-08-04 LAB — COMPREHENSIVE METABOLIC PANEL
ALT: 12 U/L (ref 0–44)
AST: 14 U/L — ABNORMAL LOW (ref 15–41)
Albumin: 4.1 g/dL (ref 3.5–5.0)
Alkaline Phosphatase: 62 U/L (ref 38–126)
Anion gap: 10 (ref 5–15)
BUN: 54 mg/dL — ABNORMAL HIGH (ref 8–23)
CO2: 24 mmol/L (ref 22–32)
Calcium: 9.7 mg/dL (ref 8.9–10.3)
Chloride: 104 mmol/L (ref 98–111)
Creatinine, Ser: 3.21 mg/dL — ABNORMAL HIGH (ref 0.61–1.24)
GFR, Estimated: 20 mL/min — ABNORMAL LOW (ref >60)
Glucose, Bld: 117 mg/dL — ABNORMAL HIGH (ref 70–99)
Potassium: 4.6 mmol/L (ref 3.5–5.1)
Sodium: 138 mmol/L (ref 135–145)
Total Bilirubin: 0.3 mg/dL (ref 0.3–1.2)
Total Protein: 7.9 g/dL (ref 6.5–8.1)

## 2021-08-04 LAB — URINALYSIS, ROUTINE W REFLEX MICROSCOPIC
Bilirubin Urine: NEGATIVE
Glucose, UA: NEGATIVE mg/dL
Hgb urine dipstick: NEGATIVE
Ketones, ur: 5 mg/dL — AB
Leukocytes,Ua: NEGATIVE
Nitrite: NEGATIVE
Protein, ur: NEGATIVE mg/dL
Specific Gravity, Urine: 1.016 (ref 1.005–1.030)
pH: 5 (ref 5.0–8.0)

## 2021-08-04 LAB — CBC
HCT: 28.3 % — ABNORMAL LOW (ref 39.0–52.0)
Hemoglobin: 9.4 g/dL — ABNORMAL LOW (ref 13.0–17.0)
MCH: 32.3 pg (ref 26.0–34.0)
MCHC: 33.2 g/dL (ref 30.0–36.0)
MCV: 97.3 fL (ref 80.0–100.0)
Platelets: 232 10*3/uL (ref 150–400)
RBC: 2.91 MIL/uL — ABNORMAL LOW (ref 4.22–5.81)
RDW: 11.9 % (ref 11.5–15.5)
WBC: 8 10*3/uL (ref 4.0–10.5)
nRBC: 0 % (ref 0.0–0.2)

## 2021-08-04 LAB — CBC WITH DIFFERENTIAL/PLATELET
Abs Immature Granulocytes: 0.04 10*3/uL (ref 0.00–0.07)
Basophils Absolute: 0.1 10*3/uL (ref 0.0–0.1)
Basophils Relative: 1 %
Eosinophils Absolute: 0.4 10*3/uL (ref 0.0–0.5)
Eosinophils Relative: 4 %
HCT: 27.4 % — ABNORMAL LOW (ref 39.0–52.0)
Hemoglobin: 9 g/dL — ABNORMAL LOW (ref 13.0–17.0)
Immature Granulocytes: 0 %
Lymphocytes Relative: 19 %
Lymphs Abs: 1.8 10*3/uL (ref 0.7–4.0)
MCH: 31.4 pg (ref 26.0–34.0)
MCHC: 32.8 g/dL (ref 30.0–36.0)
MCV: 95.5 fL (ref 80.0–100.0)
Monocytes Absolute: 0.8 10*3/uL (ref 0.1–1.0)
Monocytes Relative: 8 %
Neutro Abs: 6.4 10*3/uL (ref 1.7–7.7)
Neutrophils Relative %: 68 %
Platelets: 243 10*3/uL (ref 150–400)
RBC: 2.87 MIL/uL — ABNORMAL LOW (ref 4.22–5.81)
RDW: 11.9 % (ref 11.5–15.5)
WBC: 9.6 10*3/uL (ref 4.0–10.5)
nRBC: 0 % (ref 0.0–0.2)

## 2021-08-04 LAB — RESP PANEL BY RT-PCR (FLU A&B, COVID) ARPGX2
Influenza A by PCR: NEGATIVE
Influenza B by PCR: NEGATIVE
SARS Coronavirus 2 by RT PCR: NEGATIVE

## 2021-08-04 LAB — LACTIC ACID, PLASMA: Lactic Acid, Venous: 1.1 mmol/L (ref 0.5–1.9)

## 2021-08-04 LAB — HEMOGLOBIN A1C
Hgb A1c MFr Bld: 6.3 % — ABNORMAL HIGH (ref 4.8–5.6)
Mean Plasma Glucose: 134.11 mg/dL

## 2021-08-04 LAB — GLUCOSE, CAPILLARY
Glucose-Capillary: 99 mg/dL (ref 70–99)
Glucose-Capillary: 143 mg/dL — ABNORMAL HIGH (ref 70–99)

## 2021-08-04 MED ORDER — ASPIRIN 81 MG PO CHEW
81.0000 mg | CHEWABLE_TABLET | Freq: Every day | ORAL | Status: DC
  Administered 2021-08-05 – 2021-08-07 (×3): 81 mg via ORAL
  Filled 2021-08-04 (×3): qty 1

## 2021-08-04 MED ORDER — ONDANSETRON HCL 4 MG PO TABS: 4.0000 mg | ORAL_TABLET | Freq: Four times a day (QID) | ORAL | Status: DC | PRN

## 2021-08-04 MED ORDER — GABAPENTIN 100 MG PO CAPS
100.0000 mg | ORAL_CAPSULE | Freq: Three times a day (TID) | ORAL | Status: DC
  Administered 2021-08-04 – 2021-08-07 (×9): 100 mg via ORAL
  Filled 2021-08-04 (×9): qty 1

## 2021-08-04 MED ORDER — ACETAMINOPHEN 650 MG RE SUPP: 650.0000 mg | Freq: Four times a day (QID) | RECTAL | Status: DC | PRN

## 2021-08-04 MED ORDER — ENOXAPARIN SODIUM 30 MG/0.3ML IJ SOSY
30.0000 mg | PREFILLED_SYRINGE | INTRAMUSCULAR | Status: DC
  Administered 2021-08-04 – 2021-08-06 (×3): 30 mg via SUBCUTANEOUS
  Filled 2021-08-04 (×3): qty 0.3

## 2021-08-04 MED ORDER — HYDROCODONE-ACETAMINOPHEN 5-325 MG PO TABS
1.0000 | ORAL_TABLET | Freq: Four times a day (QID) | ORAL | Status: DC | PRN
  Administered 2021-08-04 – 2021-08-06 (×4): 1 via ORAL
  Filled 2021-08-04 (×4): qty 1

## 2021-08-04 MED ORDER — ACETAMINOPHEN 325 MG PO TABS: 650.0000 mg | ORAL_TABLET | Freq: Four times a day (QID) | ORAL | Status: DC | PRN

## 2021-08-04 MED ORDER — DOXYCYCLINE HYCLATE 100 MG IV SOLR
100.0000 mg | Freq: Two times a day (BID) | INTRAVENOUS | Status: DC
  Administered 2021-08-04 – 2021-08-07 (×6): 100 mg via INTRAVENOUS
  Filled 2021-08-04 (×10): qty 100

## 2021-08-04 MED ORDER — SODIUM CHLORIDE 0.9 % IV SOLN: INTRAVENOUS | Status: AC

## 2021-08-04 MED ORDER — INSULIN ASPART 100 UNIT/ML IJ SOLN: 0.0000 [IU] | Freq: Every day | INTRAMUSCULAR | Status: DC

## 2021-08-04 MED ORDER — SODIUM CHLORIDE 0.9 % IV SOLN
2.0000 g | INTRAVENOUS | Status: DC
  Administered 2021-08-04 – 2021-08-06 (×3): 2 g via INTRAVENOUS
  Filled 2021-08-04 (×3): qty 20

## 2021-08-04 MED ORDER — HYDROMORPHONE HCL 1 MG/ML IJ SOLN
0.5000 mg | Freq: Once | INTRAMUSCULAR | Status: AC
  Administered 2021-08-04: 0.5 mg via INTRAVENOUS
  Filled 2021-08-04: qty 1

## 2021-08-04 MED ORDER — SODIUM CHLORIDE 0.9 % IV BOLUS
1000.0000 mL | Freq: Once | INTRAVENOUS | Status: AC
  Administered 2021-08-04: 1000 mL via INTRAVENOUS

## 2021-08-04 MED ORDER — ONDANSETRON HCL 4 MG/2ML IJ SOLN: 4.0000 mg | Freq: Four times a day (QID) | INTRAMUSCULAR | Status: DC | PRN

## 2021-08-04 MED ORDER — INSULIN ASPART 100 UNIT/ML IJ SOLN: 0.0000 [IU] | Freq: Three times a day (TID) | INTRAMUSCULAR | Status: DC

## 2021-08-04 MED ORDER — ONDANSETRON HCL 4 MG/2ML IJ SOLN
4.0000 mg | Freq: Once | INTRAMUSCULAR | Status: AC
  Administered 2021-08-04: 4 mg via INTRAVENOUS
  Filled 2021-08-04: qty 2

## 2021-08-04 MED ORDER — POLYETHYLENE GLYCOL 3350 17 G PO PACK: 17.0000 g | PACK | Freq: Every day | ORAL | Status: DC | PRN

## 2021-08-04 NOTE — H&P (Signed)
History and Physical    Collin Yoder DOB: 04/24/1951 DOA: 08/04/2021  PCP: Pcp, No   Patient coming from: Home  I have personally briefly reviewed patient's old medical records in [place]  Chief Complaint: Right foot pain and swelling  HPI: Collin Yoder is a 70 y.o. male with medical history significant for HTN, DM, thalamic stroke. Patient presented to the ED with complaints of pain and discharge from his right foot.  Symptoms started as a callus underneath his right foot, but over the past 3 days, this callus extended to the side of his leg, and developed a wound that started draining.  No fever no chills.  She has maintained good oral intake.  No vomiting no loose stools.  No dizziness.  Patient was in the ED yesterday 11/2 and day before 11/1 with similar problems, but he left AMA.  ED Course: Temperature 97.9.  Heart rate 93-103.  Respirate rate 14-16.  Blood pressure systolic 253 664.  O2 sats greater than 96% on room air.  WBC 9.6.  Lactic acid 1.1.  Creatinine elevated 3.21.  X-ray of the right foot on 11/1 without acute bony abnormality.  No bone destruction to suggest osteomyelitis. Hospitalist admit for diabetic wound infection and acute kidney injury.  Review of Systems: As per HPI all other systems reviewed and negative.  Past Medical History:  Diagnosis Date   Borderline diabetes    Peripheral neuropathy 07/15/2019   Right lower lobe lung mass 04/11/2017   Thalamic stroke ([place]) 07/15/2019   Type II diabetes mellitus, uncontrolled 07/15/2019   Vitamin D deficiency disease 07/15/2019    Past Surgical History:  Procedure Laterality Date   APPENDECTOMY     CATARACT EXTRACTION, BILATERAL Bilateral 01/2021     reports that he has been smoking cigarettes. He has a 45.00 pack-year smoking history. He has never used smokeless tobacco. He reports current alcohol use of about 1.0 standard drink per week. He reports that he does not use  drugs.  No Known Allergies  Family History  Problem Relation Age of Onset   Diabetes Mother 88   Stroke Mother    Colon cancer Neg Hx     Prior to Admission medications   Medication Sig Start Date End Date Taking? Authorizing Provider  aspirin 81 MG chewable tablet Chew 81 mg by mouth daily.   Yes [provider]  Cholecalciferol (VITAMIN D3) 5000 units CAPS Take 1 capsule by mouth daily.   Yes [provider]  gabapentin (NEURONTIN) 100 MG capsule Take 2 capsules (200 mg total) by mouth 3 (three) times daily. 05/23/21  Yes Ailene Ards, NP  losartan (COZAAR) 50 MG tablet Take 1 tablet (50 mg total) by mouth daily. 05/23/21  Yes Ailene Ards, NP  metFORMIN (GLUCOPHAGE) 500 MG tablet TAKE 2 TABLETS by mouth IN MORNING AND 1 TABLET by mouth IN THE EVENING Patient taking differently: Take 500-1,000 mg by mouth in the morning and at bedtime. 2 tabs in the am, 1 tab in the evening 05/23/21  Yes Ailene Ards, NP  Alcohol Swabs (B-D SINGLE USE SWABS REGULAR) PADS 1 Units by Does not apply route daily at 12 noon. 12/15/20   Doree Albee, MD  blood glucose meter kit and supplies KIT Dispense based on patient and insurance preference. Use up to four times daily as directed. (FOR ICD-9 250.00, 250.01). 08/03/20   Ailene Ards, NP  Blood Glucose Monitoring Suppl (TRUE METRIX METER) w/Device  KIT USE AS DIRECTED 02/22/21   Hurshel Party C, MD  glucose blood (TRUE METRIX BLOOD GLUCOSE TEST) test strip Use as instructed 12/15/20   Doree Albee, MD  TRUEplus Lancets 33G MISC 1 Units by Does not apply route daily at 12 noon. 12/15/20   Doree Albee, MD    Physical Exam: Vitals:   08/04/21 1343 08/04/21 1430  BP: 132/77 128/63  Pulse: (!) 103 93  Resp: 16 14  Temp: 97.9 F (36.6 C)   TempSrc: Oral   SpO2: 98% 96%    Constitutional: NAD, calm, comfortable Vitals:   08/04/21 1343 08/04/21 1430  BP: 132/77 128/63  Pulse: (!) 103 93  Resp: 16 14  Temp: 97.9 F  (36.6 C)   TempSrc: Oral   SpO2: 98% 96%   Eyes: PERRL, lids and conjunctivae normal ENMT: Mucous membranes are dry  Neck: normal, supple, no masses, no thyromegaly Respiratory: clear to auscultation bilaterally, no wheezing, no crackles. Normal respiratory effort. No accessory muscle use.  Cardiovascular: Regular rate and rhythm, no murmurs / rubs / gallops. No extremity edema. 2+ pedal pulses.  Abdomen: no tenderness, no masses palpated. No hepatosplenomegaly. Bowel sounds positive.  Musculoskeletal: no clubbing / cyanosis. No joint deformity upper and lower extremities. Good ROM, no contractures. Normal muscle tone.  Skin: Erythema to right foot-mostly around the fifth metatarsal area, with tenderness, differential warmth, also callus on the sole extending to lateral aspect of foot, no drainage at this time, small opening measuring about ~0.3 cm across. No induration Neurologic: No apparent cranial nerve abnormality, moving all extremities spontaneously. Psychiatric: Normal judgment and insight. Alert and oriented x 3. Normal mood.          Labs on Admission: I have personally reviewed following labs and imaging studies  CBC: Recent Labs  Lab 08/03/21 0749 08/04/21 1444  WBC 10.0 9.6  NEUTROABS 7.2 6.4  HGB 9.6* 9.0*  HCT 29.0* 27.4*  MCV 94.5 95.5  PLT 205 790   Basic Metabolic Panel: Recent Labs  Lab 08/03/21 0749 08/04/21 1444  NA 135 138  K 4.4 4.6  CL 103 104  CO2 24 24  GLUCOSE 152* 117*  BUN 48* 54*  CREATININE 2.98* 3.21*  CALCIUM 9.1 9.7   Liver Function Tests: Recent Labs  Lab 08/03/21 0749 08/04/21 1444  AST 12* 14*  ALT 10 12  ALKPHOS 55 62  BILITOT 0.6 0.3  PROT 7.7 7.9  ALBUMIN 3.9 4.1    Radiological Exams on Admission: DG Foot Complete Right  Result Date: 08/02/2021 CLINICAL DATA:  Right foot wound near 5th MTP EXAM: RIGHT FOOT COMPLETE - 3+ VIEW COMPARISON:  None. FINDINGS: No acute bony abnormality. Specifically, no fracture,  subluxation, or dislocation. No bone destruction to suggest osteomyelitis. No soft tissue gas. IMPRESSION: No acute bony abnormality. Electronically Signed   By: Rolm Baptise M.D.   On: 08/02/2021 18:11    EKG: None.   Assessment/Plan Principal Problem:   Cellulitis of right foot Active Problems:   Diabetes mellitus ([place])   Hypertension   AKI (acute kidney injury) ([place])   Diabetic foot infection with cellulitis -drainage of 3 days, callus presents for a month, with erythema and differential warmth.  Rules out for sepsis.  Heart rate 93-103.  WBC 9.6.  Lactic acid 1.1.  X-ray from 11/1 without acute abnormality. -Obtain CT of the right foot, rule out underlying collection -Avoiding Vanc due to AKI, -IV ceftriaxone 2 g daily +  IV doxycycline -Follow-up  blood cultures -Hydrocodone acetaminophen 5/325 6 hourly as needed -Resume gabapentin at adjusted dose due to renal insufficiency. - HgbA1c - SSI- S -Hold home metformin  Acute kidney injury-creatinine 3.21, baseline 0.9-1.  Denies GI losses.  Dry mucous membranes slightly.  Now, also he is on ARB- losartan. - 1 l bolus given, continue normal saline 100cc/hr x 1 day -Avoid nephrotoxic  Hypertension-stable. -Hold losartan with AKI -Labetalol 10 mg for systolic greater than 709  Social issues-patient has an ankle bracelet that should be coming off next month (said he was involved with someone who gave him the impression that she was older than she was).  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  DVT prophylaxis:  Lovenox Code Status: Full code Family Communication: None at bedside. Disposition Plan: ~ 2- 3 days.  Pending improvement in cellulitis. Consults called: None Admission status: Inpt,Med surg I certify that at the point of admission it is my clinical judgment that the patient will require inpatient hospital care spanning beyond 2  midnights from the point of admission due to high intensity of service, high risk for further deterioration and high frequency of surveillance required.    Bethena Roys MD [facility]  08/04/2021, 4:51 PM

## 2021-08-04 NOTE — ED Notes (Signed)
Dr Arlyce Dice at bedside.

## 2021-08-04 NOTE — ED Notes (Signed)
Pt not able to void at this time.

## 2021-08-04 NOTE — ED Triage Notes (Signed)
Right foot pain, here for admission

## 2021-08-04 NOTE — ED Provider Notes (Signed)
[facility] EMERGENCY DEPARTMENT Provider Note   CSN: [id number] Arrival date & time: 08/04/21  1316     History Chief Complaint  Patient presents with   Foot Pain    Collin Yoder is a 70 y.o. male.  HPI  Patient with significant medical history including type 2 diabetes, CVA, presents to the emergency department with chief complaint of a right foot wound.  Patient states he noted a callus on the lateral aspect of his foot, he states he has been applying cream to the area but 3 days ago he noted there is some increasing redness and now noted some discharge coming from the area.  He states he has pain during the nighttime, pain will improve during the day, pain is a dull-like sensation, does not radiate.  denies any paresthesia or weakness in his foot, denies fevers, chills, worsening peripheral edema.  He has no other complaints this time, denies  alleviating or aggravating factors.   noted that patient was seen here yesterday, had lab work and imaging performed, noted that he has a acute AKI and likely a diabetic foot ulcer, patient left AMA.  Patient states that he is never had any issues with his kidneys, he does endorse that he does not drink much water and mainly coffee, or he denies  flank pain, suprapubic pain, dysuria, hematuria, decrease in urination.  He has no other complaints at this time.  Past Medical History:  Diagnosis Date   Borderline diabetes    Peripheral neuropathy 07/15/2019   Right lower lobe lung mass 04/11/2017   Thalamic stroke ([place]) 07/15/2019   Type II diabetes mellitus, uncontrolled 07/15/2019   Vitamin D deficiency disease 07/15/2019    Patient Active Problem List   Diagnosis Date Noted   Cellulitis of right foot 08/04/2021   AKI (acute kidney injury) ([place]) 08/04/2021   Hypertension 08/03/2020   Hyperlipidemia 08/03/2020   Cigarette nicotine dependence without complication 06/20/8021   Vitamin D deficiency disease 07/15/2019   Peripheral  neuropathy 07/15/2019   Thalamic stroke ([place]) 07/15/2019   Type II diabetes mellitus, uncontrolled 07/15/2019   Pulmonary nodule, right 12/04/2017   Cough 12/04/2017   Abnormal weight loss 12/04/2017   Abdominal pain 12/04/2017   Right lower lobe lung mass 04/11/2017    Past Surgical History:  Procedure Laterality Date   APPENDECTOMY     CATARACT EXTRACTION, BILATERAL Bilateral 01/2021       Family History  Problem Relation Age of Onset   Diabetes Mother 12   Stroke Mother    Colon cancer Neg Hx     Social History   Tobacco Use   Smoking status: Every Day    Packs/day: 1.00    Years: 45.00    Pack years: 45.00    Types: Cigarettes   Smokeless tobacco: Never  Vaping Use   Vaping Use: Never used  Substance Use Topics   Alcohol use: Yes    Alcohol/week: 1.0 standard drink    Types: 1 Cans of beer per week    Comment: every 3 days   Drug use: No    Home Medications Prior to Admission medications   Medication Sig Start Date End Date Taking? Authorizing Provider  aspirin 81 MG chewable tablet Chew 81 mg by mouth daily.   Yes [provider]  Cholecalciferol (VITAMIN D3) 5000 units CAPS Take 1 capsule by mouth daily.   Yes [provider]  gabapentin (NEURONTIN) 100 MG capsule Take 2 capsules (200 mg total) by  mouth 3 (three) times daily. 05/23/21  Yes Ailene Ards, NP  losartan (COZAAR) 50 MG tablet Take 1 tablet (50 mg total) by mouth daily. 05/23/21  Yes Ailene Ards, NP  metFORMIN (GLUCOPHAGE) 500 MG tablet TAKE 2 TABLETS by mouth IN MORNING AND 1 TABLET by mouth IN THE EVENING Patient taking differently: Take 500-1,000 mg by mouth in the morning and at bedtime. 2 tabs in the am, 1 tab in the evening 05/23/21  Yes Ailene Ards, NP  Alcohol Swabs (B-D SINGLE USE SWABS REGULAR) PADS 1 Units by Does not apply route daily at 12 noon. 12/15/20   Doree Albee, MD  blood glucose meter kit and supplies KIT Dispense based on patient and insurance  preference. Use up to four times daily as directed. (FOR ICD-9 250.00, 250.01). 08/03/20   Ailene Ards, NP  Blood Glucose Monitoring Suppl (TRUE METRIX METER) w/Device KIT USE AS DIRECTED 02/22/21   Hurshel Party C, MD  glucose blood (TRUE METRIX BLOOD GLUCOSE TEST) test strip Use as instructed 12/15/20   Doree Albee, MD  TRUEplus Lancets 33G MISC 1 Units by Does not apply route daily at 12 noon. 12/15/20   Doree Albee, MD    Allergies    Patient has no known allergies.  Review of Systems   Review of Systems  Constitutional:  Negative for chills and fever.  HENT:  Negative for congestion.   Respiratory:  Negative for shortness of breath.   Cardiovascular:  Negative for chest pain.  Gastrointestinal:  Negative for abdominal pain.  Genitourinary:  Negative for enuresis.  Musculoskeletal:  Negative for back pain.       Right foot pain.  Skin:  Positive for wound. Negative for rash.       Wound on the right lateral aspect of his foot  Neurological:  Negative for dizziness.  Hematological:  Does not bruise/bleed easily.   Physical Exam Updated Vital Signs BP 128/63   Pulse 93   Temp 97.9 F (36.6 C) (Oral)   Resp 14   SpO2 96%   Physical Exam Vitals and nursing note reviewed.  Constitutional:      General: He is not in acute distress.    Appearance: He is not ill-appearing.     Comments: Patient is noted to acute distress, tachycardic, afebrile, nonhypoxic, nontachypneic.  HENT:     Head: Normocephalic and atraumatic.     Nose: No congestion.     Mouth/Throat:     Mouth: Mucous membranes are moist.     Pharynx: Oropharynx is clear.  Eyes:     Conjunctiva/sclera: Conjunctivae normal.  Cardiovascular:     Rate and Rhythm: Regular rhythm. Tachycardia present.     Pulses: Normal pulses.     Heart sounds: No murmur heard.   No friction rub. No gallop.  Pulmonary:     Effort: No respiratory distress.     Breath sounds: No wheezing, rhonchi or rales.  Abdominal:      Palpations: Abdomen is soft.     Tenderness: There is no abdominal tenderness. There is no right CVA tenderness or left CVA tenderness.  Musculoskeletal:     Comments: Right foot was visualized there is noted erythema on the anterior aspect of the foot that extends from the mid third metatarsal to the fifth metatarsal extending into his right pinky toe.  There is a ulcer with some purulent discharge on the lateral aspect at the MTP joint, he has full range  of motion in all of his toes, neurovascular is fully intact.  Please see picture for full detail.  Skin:    General: Skin is warm and dry.  Neurological:     Mental Status: He is alert.  Psychiatric:        Mood and Affect: Mood normal.         ED Results / Procedures / Treatments   Labs (all labs ordered are listed, but only abnormal results are displayed) Labs Reviewed  CBC WITH DIFFERENTIAL/PLATELET - Abnormal; Notable for the following components:      Result Value   RBC 2.87 (*)    Hemoglobin 9.0 (*)    HCT 27.4 (*)    All other components within normal limits  COMPREHENSIVE METABOLIC PANEL - Abnormal; Notable for the following components:   Glucose, Bld 117 (*)    BUN 54 (*)    Creatinine, Ser 3.21 (*)    AST 14 (*)    GFR, Estimated 20 (*)    All other components within normal limits  CULTURE, BLOOD (ROUTINE X 2)  CULTURE, BLOOD (ROUTINE X 2)  RESP PANEL BY RT-PCR (FLU A&B, COVID) [id number]  LACTIC ACID, PLASMA  URINALYSIS, ROUTINE W REFLEX MICROSCOPIC    EKG None  Radiology DG Foot Complete Right  Result Date: 08/02/2021 CLINICAL DATA:  Right foot wound near 5th MTP EXAM: RIGHT FOOT COMPLETE - 3+ VIEW COMPARISON:  None. FINDINGS: No acute bony abnormality. Specifically, no fracture, subluxation, or dislocation. No bone destruction to suggest osteomyelitis. No soft tissue gas. IMPRESSION: No acute bony abnormality. Electronically Signed   By: Rolm Baptise M.D.   On: 08/02/2021 18:11     Procedures Procedures   Medications Ordered in ED Medications  sodium chloride 0.9 % bolus 1,000 mL (0 mLs Intravenous Stopped 08/04/21 1603)  HYDROmorphone (DILAUDID) injection 0.5 mg (0.5 mg Intravenous Given 08/04/21 1455)  ondansetron (ZOFRAN) injection 4 mg (4 mg Intravenous Given 08/04/21 1448)    ED Course  I have reviewed the triage vital signs and the nursing notes.  Pertinent labs & imaging results that were available during my care of the patient were reviewed by me and considered in my medical decision making (see chart for details).    MDM Rules/Calculators/A&P                          Initial impression-patient presents withRight foot pain.  He is alert, does not appear acute stress, vital signs noted for tachycardia.  Likely patient from acute AKI secondary to dehydration as well as a diabetic foot will obtain sepsis-like work-up, so patient fluids and reassess.  Work-up-CBC shows grossly anemia hemoglobin 9.0 appears to be at baseline, CMP shows elevated BUN of 54, creatinine 3.21 baseline appears to be around 1, GFR is 20, lactic 1.1.   Reassessment-noted that patient has worsening creatinine functions, will add on a liter of fluids, he does not meet sepsis criteria at this time, will start him on antibiotics, no history of MRSA will start with vancomycin per pharmacy.  Update patient on lab work, recommend admission at this time, he is going this plan, will consult hospitalist team.  Consult - spoke with Dr. Su Ley who will admit the patient.   Rule out-low suspicion for systemic infection as patient nontoxic-appearing, vital signs are reassuring, no leukocytosis present.  Does not meet sepsis criteria.  I have low suspicion for osteomyelitis as imaging was performed yesterday was negative for this,  will defer further imaging at this time.  I have low suspicion for kidney stone, [place], UTI as patient denies any urinary symptoms, he had no CVA tenderness on my exam.   Does have a noted elevated creatinine, states that he does not drink much water in his house drinking coffee I suspect this is likely secondary due to dehydration.  Plan- Diabetic foot-will need further evaluation continue management with IV antibiotics. AKI-likely secondary due to dehydration, will need further fluid resuscitation and continue monitoring.  Final Clinical Impression(s) / ED Diagnoses Final diagnoses:  AKI (acute kidney injury) ([place])  Diabetic foot infection [facility])    Rx / [place] Orders ED Discharge Orders     None        Marcello Fennel, PA-C 08/04/21 1605    Long, Wonda Olds, MD 08/11/21 1826

## 2021-08-05 DIAGNOSIS — L03115 Cellulitis of right lower limb: Secondary | ICD-10-CM | POA: Diagnosis not present

## 2021-08-05 LAB — CBC
HCT: 30.1 % — ABNORMAL LOW (ref 39.0–52.0)
Hemoglobin: 9.6 g/dL — ABNORMAL LOW (ref 13.0–17.0)
MCH: 31.4 pg (ref 26.0–34.0)
MCHC: 31.9 g/dL (ref 30.0–36.0)
MCV: 98.4 fL (ref 80.0–100.0)
Platelets: 235 10*3/uL (ref 150–400)
RBC: 3.06 MIL/uL — ABNORMAL LOW (ref 4.22–5.81)
RDW: 11.9 % (ref 11.5–15.5)
WBC: 7.6 10*3/uL (ref 4.0–10.5)
nRBC: 0 % (ref 0.0–0.2)

## 2021-08-05 LAB — GLUCOSE, CAPILLARY
Glucose-Capillary: 108 mg/dL — ABNORMAL HIGH (ref 70–99)
Glucose-Capillary: 112 mg/dL — ABNORMAL HIGH (ref 70–99)
Glucose-Capillary: 97 mg/dL (ref 70–99)
Glucose-Capillary: 84 mg/dL (ref 70–99)

## 2021-08-05 LAB — BASIC METABOLIC PANEL
Anion gap: 10 (ref 5–15)
BUN: 47 mg/dL — ABNORMAL HIGH (ref 8–23)
CO2: 21 mmol/L — ABNORMAL LOW (ref 22–32)
Calcium: 9 mg/dL (ref 8.9–10.3)
Chloride: 108 mmol/L (ref 98–111)
Creatinine, Ser: 2.75 mg/dL — ABNORMAL HIGH (ref 0.61–1.24)
GFR, Estimated: 24 mL/min — ABNORMAL LOW (ref >60)
Glucose, Bld: 101 mg/dL — ABNORMAL HIGH (ref 70–99)
Potassium: 4.3 mmol/L (ref 3.5–5.1)
Sodium: 139 mmol/L (ref 135–145)

## 2021-08-05 MED ORDER — SODIUM CHLORIDE 0.9 % IV SOLN: INTRAVENOUS | Status: DC

## 2021-08-05 NOTE — Consult Note (Addendum)
[facility] Nurse Consult Note: Reason for Consult: Consult requested for right foot.  Performed remotely after review of photos and progress notes in the EMR.  Wound type: Right plantar foot with dry yellow callous, slightly raised above skin level. Right outer and anterior foot with full thickness wound; red and moist covered by loose peeling white macerated skin, mod amt tan drainage, generalized edema and erythremia surrounding. Pt is on systemic antibiotic coverage  Dressing procedure/placement/frequency: Topical treatment orders provided for bedside nurses to perform to promote healing:  Apply Xeroform gauze to anterior/posterior right foot wound Q day, then cover with gauze and kerlex. Cleanse with NS each time and gently scrub to assist with removal of loose skin. Please re-consult if further assistance is needed.  Thank-you,  Julien Girt MSN, [place], [place], [place], [place]

## 2021-08-05 NOTE — Care Management Important Message (Signed)
Important Message  Patient Details  Name: Collin Yoder MRN: [id number] Date of Birth: Apr 16, 1951   Medicare Important Message Given:  Yes    Tommy Medal 08/05/2021, 4:17 PM

## 2021-08-06 DIAGNOSIS — L03115 Cellulitis of right lower limb: Secondary | ICD-10-CM | POA: Diagnosis not present

## 2021-08-06 LAB — GLUCOSE, CAPILLARY
Glucose-Capillary: 78 mg/dL (ref 70–99)
Glucose-Capillary: 96 mg/dL (ref 70–99)
Glucose-Capillary: 79 mg/dL (ref 70–99)
Glucose-Capillary: 77 mg/dL (ref 70–99)

## 2021-08-06 NOTE — Progress Notes (Signed)
PROGRESS NOTE     Collin Yoder, is a 70 y.o. male, DOB - March 28, 1951, OVZ:[id number]  Admit date - 08/04/2021   Admitting Physician Ejiroghene Arlyce Dice, MD  Outpatient Primary MD for the patient is Pcp, No  LOS - 2  Chief Complaint  Patient presents with   Foot Pain        Brief Narrative:  70 y.o. male with medical history significant for HTN, DM, thalamic stroke admitted on 08/04/2021 with right foot cellulitis/third callus/diabetic wound   Assessment & Plan:   Principal Problem:   Cellulitis of right foot Active Problems:   Diabetes mellitus ([place])   Hypertension   AKI (acute kidney injury) ([place])  1)right foot cellulitis/third callus/diabetic wound-- --CT of the right foot consistent with cellulitis and distal Achilles tendinopathy without osteomyelitis -Wound culture obtained -Wound care consult requested and appreciated continue IV Rocephin and doxycycline to add MRSA coverage -Right foot swelling, warmth, tenderness and erythema persist  2)AKI---- -Creatinine this admission peaked at 3.21, baseline usually around 1.0 -Creatinine trended down with hydration -Continue to hold metformin and losartan renally adjust medications, avoid nephrotoxic agents / dehydration  / hypotension -Repeat BMP in a.m.   3)HTN--continue to hold losartan due to AKI concerns, use of labetalol as needed  4)Social issues-patient has an ankle bracelet that should be coming off next month (said he was involved with someone who gave him the impression that she was older than she was).  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    Disposition/Need for in-Hospital Stay- patient unable to be discharged at this time due to -right foot cellulitis requiring IV antibiotics pending further culture data, AKI requiring IV fluids* -Possible discharge home on 08/07/2021 if renal function or right foot  cellulitis continues to improve  Status is: Inpatient  Remains inpatient appropriate because: See disposition above  Disposition: The patient is from: Home              Anticipated d/c is to: Home              Anticipated d/c date is: 1 day              Patient currently is not medically stable to d/c. Barriers: Not Clinically Stable-   Code Status :  -  Code Status: Full Code   Family Communication:   NA (patient is alert, awake and coherent)   Consults  :  wound care  DVT Prophylaxis  :   - SCDs   enoxaparin (LOVENOX) injection 30 mg Start: 08/04/21 1845  Lab Results  Component Value Date   PLT 235 08/05/2021    Inpatient Medications  Scheduled Meds:  aspirin  81 mg Oral Daily   enoxaparin (LOVENOX) injection  30 mg Subcutaneous Q24H   gabapentin  100 mg Oral TID   insulin aspart  0-5 Units Subcutaneous QHS   insulin aspart  0-9 Units Subcutaneous TID WC   Continuous Infusions:  sodium chloride 125 mL/hr at 08/06/21 1553   cefTRIAXone (ROCEPHIN)  IV 2 g (08/06/21 1557)   doxycycline (VIBRAMYCIN) IV 100 mg (08/06/21 1708)   PRN Meds:.acetaminophen **OR** acetaminophen, HYDROcodone-acetaminophen, ondansetron **OR** ondansetron (ZOFRAN) IV, polyethylene glycol   Anti-infectives (From admission, onward)    Start     Dose/Rate Route Frequency Ordered Stop   08/04/21 1730  doxycycline (VIBRAMYCIN) 100 mg in sodium chloride 0.9 % 250 mL IVPB  100 mg 125 mL/hr over 120 Minutes Intravenous Every 12 hours 08/04/21 1650     08/04/21 1630  cefTRIAXone (ROCEPHIN) 2 g in sodium chloride 0.9 % 100 mL IVPB        2 g 200 mL/hr over 30 Minutes Intravenous Every 24 hours 08/04/21 1622 08/11/21 1629         Subjective: Walker Kehr today has no fevers, no emesis,  No chest pain,   Right foot swelling, warmth, tenderness and erythema persist, not worse   Objective: Vitals:   08/05/21 1214 08/05/21 2102 08/06/21 0510 08/06/21 1443  BP: 137/71 139/72 (!) 127/57  (!) 169/79  Pulse: 66 64 72 70  Resp: 16 16 18 16   Temp: 98.1 F (36.7 C) 98 F (36.7 C) 98.2 F (36.8 C) 98 F (36.7 C)  TempSrc: Oral  Oral Oral  SpO2: 98% 99% 98% 99%  Weight:      Height:        Intake/Output Summary (Last 24 hours) at 08/06/2021 1907 Last data filed at 08/06/2021 1600 Gross per 24 hour  Intake 3439.42 ml  Output --  Net 3439.42 ml   Filed Weights   08/04/21 1822  Weight: 79.2 kg   Physical Exam  Gen:- Awake Alert,  in no apparent distress  HEENT:- [place].AT, No sclera icterus Neck-Supple Neck,No JVD,.  Lungs-  CTAB , fair symmetrical air movement CV- S1, S2 normal, regular  Abd-  +ve B.Sounds, Abd Soft, No tenderness,    Extremity/Skin:- No  edema, pedal pulses present  Psych-affect is appropriate, oriented x3 Neuro-no new focal deficits, no tremors MSK-right foot drainage appears purulent, erythema, warmth and swelling and tenderness over the right foot worse on the fibular side -Media Information Document Information  Photos    08/04/2021 14:28  Attached To:  Hospital Encounter on 08/04/21   Source Information  Marcello Fennel, PA-C  [facility]    Data Reviewed: I have personally reviewed following labs and imaging studies  CBC: Recent Labs  Lab 08/03/21 0749 08/04/21 1444 08/04/21 1807 08/05/21 0443  WBC 10.0 9.6 8.0 7.6  NEUTROABS 7.2 6.4  --   --   HGB 9.6* 9.0* 9.4* 9.6*  HCT 29.0* 27.4* 28.3* 30.1*  MCV 94.5 95.5 97.3 98.4  PLT 205 243 232 376   Basic Metabolic Panel: Recent Labs  Lab 08/03/21 0749 08/04/21 1444 08/05/21 0443  NA 135 138 139  K 4.4 4.6 4.3  CL 103 104 108  CO2 24 24 21*  GLUCOSE 152* 117* 101*  BUN 48* 54* 47*  CREATININE 2.98* 3.21* 2.75*  CALCIUM 9.1 9.7 9.0   GFR: Estimated Creatinine Clearance: 22.9 mL/min (A) (by C-G formula based on SCr of 2.75 mg/dL (H)). Liver Function Tests: Recent Labs  Lab 08/03/21 0749 08/04/21 1444  AST 12* 14*  ALT 10 12  ALKPHOS 55 62  BILITOT  0.6 0.3  PROT 7.7 7.9  ALBUMIN 3.9 4.1   No results for input(s): LIPASE, AMYLASE in the last 168 hours. No results for input(s): AMMONIA in the last 168 hours. Coagulation Profile: No results for input(s): INR, PROTIME in the last 168 hours. Cardiac Enzymes: No results for input(s): CKTOTAL, CKMB, CKMBINDEX, TROPONINI in the last 168 hours. BNP (last 3 results) No results for input(s): PROBNP in the last 8760 hours. HbA1C: Recent Labs    08/04/21 1442  HGBA1C 6.3*   CBG: Recent Labs  Lab 08/05/21 1611 08/05/21 2129 08/06/21 0803 08/06/21 1119 08/06/21 1622  GLUCAP 97  84 78 96 79   Lipid Profile: No results for input(s): CHOL, HDL, LDLCALC, TRIG, CHOLHDL, LDLDIRECT in the last 72 hours. Thyroid Function Tests: No results for input(s): TSH, T4TOTAL, FREET4, T3FREE, THYROIDAB in the last 72 hours. Anemia Panel: No results for input(s): VITAMINB12, FOLATE, FERRITIN, TIBC, IRON, RETICCTPCT in the last 72 hours. Urine analysis:    Component Value Date/Time   COLORURINE YELLOW 08/04/2021 1700   APPEARANCEUR CLEAR 08/04/2021 1700   LABSPEC 1.016 08/04/2021 1700   PHURINE 5.0 08/04/2021 1700   GLUCOSEU NEGATIVE 08/04/2021 1700   HGBUR NEGATIVE 08/04/2021 1700   BILIRUBINUR NEGATIVE 08/04/2021 1700   KETONESUR 5 (A) 08/04/2021 1700   PROTEINUR NEGATIVE 08/04/2021 1700   NITRITE NEGATIVE 08/04/2021 1700   LEUKOCYTESUR NEGATIVE 08/04/2021 1700   Sepsis Labs: @LABRCNTIP (procalcitonin:4,lacticidven:4)  ) Recent Results (from the past 240 hour(s))  Resp Panel by RT-PCR (Flu A&B, Covid) Nasopharyngeal Swab     Status: None   Collection Time: 08/04/21  2:42 PM   Specimen: Nasopharyngeal Swab; Nasopharyngeal(NP) swabs in vial transport medium  Result Value Ref Range Status   SARS Coronavirus 2 by RT PCR NEGATIVE NEGATIVE Final    Comment: (NOTE) SARS-CoV-2 target nucleic acids are NOT DETECTED.  The SARS-CoV-2 RNA is generally detectable in upper respiratory specimens  during the acute phase of infection. The lowest concentration of SARS-CoV-2 viral copies this assay can detect is 138 copies/mL. A negative result does not preclude SARS-Cov-2 infection and should not be used as the sole basis for treatment or other patient management decisions. A negative result may occur with  improper specimen collection/handling, submission of specimen other than nasopharyngeal swab, presence of viral mutation(s) within the areas targeted by this assay, and inadequate number of viral copies(<138 copies/mL). A negative result must be combined with clinical observations, patient history, and epidemiological information. The expected result is Negative.  Fact Sheet for Patients:  EntrepreneurPulse.com.au  Fact Sheet for Healthcare Providers:  IncredibleEmployment.be  This test is no t yet approved or cleared by the [place] FDA and  has been authorized for detection and/or diagnosis of SARS-CoV-2 by FDA under an Emergency Use Authorization (EUA). This EUA will remain  in effect (meaning this test can be used) for the duration of the COVID-19 declaration under Section 564(b)(1) of the Act, 21 U.S.C.section 360bbb-3(b)(1), unless the authorization is terminated  or revoked sooner.       Influenza A by PCR NEGATIVE NEGATIVE Final   Influenza B by PCR NEGATIVE NEGATIVE Final    Comment: (NOTE) The Xpert Xpress SARS-CoV-2/FLU/RSV plus assay is intended as an aid in the diagnosis of influenza from Nasopharyngeal swab specimens and should not be used as a sole basis for treatment. Nasal washings and aspirates are unacceptable for Xpert Xpress SARS-CoV-2/FLU/RSV testing.  Fact Sheet for Patients: EntrepreneurPulse.com.au  Fact Sheet for Healthcare Providers: IncredibleEmployment.be  This test is not yet approved or cleared by the [place] FDA and has been authorized for detection  and/or diagnosis of SARS-CoV-2 by FDA under an Emergency Use Authorization (EUA). This EUA will remain in effect (meaning this test can be used) for the duration of the COVID-19 declaration under Section 564(b)(1) of the Act, 21 U.S.C. section 360bbb-3(b)(1), unless the authorization is terminated or revoked.  Performed at [facility], [address] [place], [place] [address]   Blood culture (routine x 2)     Status: None (Preliminary result)   Collection Time: 08/04/21  2:44 PM   Specimen: BLOOD LEFT ARM  Result Value Ref Range Status   Specimen Description BLOOD LEFT ARM  Final   Special Requests   Final    BOTTLES DRAWN AEROBIC AND ANAEROBIC Blood Culture adequate volume   Culture   Final    NO GROWTH 2 DAYS Performed at [facility], [address] [place], [place] [address]    Report Status PENDING  Incomplete  Blood culture (routine x 2)     Status: None (Preliminary result)   Collection Time: 08/04/21  2:44 PM   Specimen: BLOOD RIGHT ARM  Result Value Ref Range Status   Specimen Description BLOOD RIGHT ARM  Final   Special Requests   Final    BOTTLES DRAWN AEROBIC AND ANAEROBIC Blood Culture adequate volume   Culture   Final    NO GROWTH 2 DAYS Performed at [facility], [address] [place], [place] [address]    Report Status PENDING  Incomplete      Radiology Studies: No results found.   Scheduled Meds:  aspirin  81 mg Oral Daily   enoxaparin (LOVENOX) injection  30 mg Subcutaneous Q24H   gabapentin  100 mg Oral TID   insulin aspart  0-5 Units Subcutaneous QHS   insulin aspart  0-9 Units Subcutaneous TID WC   Continuous Infusions:  sodium chloride 125 mL/hr at 08/06/21 1553   cefTRIAXone (ROCEPHIN)  IV 2 g (08/06/21 1557)   doxycycline (VIBRAMYCIN) IV 100 mg (08/06/21 1708)     LOS: 2 days    Roxan Hockey M.D on 08/06/2021 at 7:07 PM  Go to [email or web address] - for contact info  [facility] - Office  [phone]  If 7PM-7AM, please  contact night-coverage [email or web address] Password [email or web address] 08/06/2021, 7:07 PM

## 2021-08-06 NOTE — Progress Notes (Signed)
PROGRESS NOTE     Lenwood Balsam, is a 70 y.o. male, DOB - July 05, 1951, PXT:[id number]  Admit date - 08/04/2021   Admitting Physician Bethena Roys, MD  Outpatient Primary MD for the patient is Pcp, No  LOS - 2  Chief Complaint  Patient presents with   Foot Pain        Brief Narrative:  70 y.o. male with medical history significant for HTN, DM, thalamic stroke admitted on 08/04/2021 with right foot cellulitis/third callus/diabetic wound   Assessment & Plan:   Principal Problem:   Cellulitis of right foot Active Problems:   Diabetes mellitus ([place])   Hypertension   AKI (acute kidney injury) ([place])  NB!!! Late Entry Note----Pt was seen and evaluated on 08/05/2021, this a late entry note   1)right foot cellulitis/third callus/diabetic wound-- --CT of the right foot consistent with cellulitis and distal Achilles tendinopathy without osteomyelitis -Wound culture obtained -Wound care consult requested and appreciated continue IV Rocephin and doxycycline to add MRSA coverage  2)AKI---- -Creatinine this admission peaked at 3.21, baseline usually around 1.0 -Creatinine trended down with hydration -Continue to hold metformin and losartan renally adjust medications, avoid nephrotoxic agents / dehydration  / hypotension   3)HTN--continue to hold losartan due to AKI concerns, use of labetalol as needed  4)Social issues-patient has an ankle bracelet that should be coming off next month (said he was involved with someone who gave him the impression that she was older than she was).  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    Disposition/Need for in-Hospital Stay- patient unable to be discharged at this time due to -right foot cellulitis requiring IV antibiotics pending further culture data, AKI requiring IV fluids*  Status is: Inpatient  Remains inpatient appropriate because: See  disposition above  Disposition: The patient is from: Home              Anticipated d/c is to: Home              Anticipated d/c date is: 2 days              Patient currently is not medically stable to d/c. Barriers: Not Clinically Stable-   Code Status :  -  Code Status: Full Code   Family Communication:   NA (patient is alert, awake and coherent)   Consults  :  wound care  DVT Prophylaxis  :   - SCDs   enoxaparin (LOVENOX) injection 30 mg Start: 08/04/21 1845  Lab Results  Component Value Date   PLT 235 08/05/2021    Inpatient Medications  Scheduled Meds:  aspirin  81 mg Oral Daily   enoxaparin (LOVENOX) injection  30 mg Subcutaneous Q24H   gabapentin  100 mg Oral TID   insulin aspart  0-5 Units Subcutaneous QHS   insulin aspart  0-9 Units Subcutaneous TID WC   Continuous Infusions:  sodium chloride 125 mL/hr at 08/05/21 1040   cefTRIAXone (ROCEPHIN)  IV 2 g (08/05/21 1537)   doxycycline (VIBRAMYCIN) IV 100 mg (08/06/21 0453)   PRN Meds:.acetaminophen **OR** acetaminophen, HYDROcodone-acetaminophen, ondansetron **OR** ondansetron (ZOFRAN) IV, polyethylene glycol   Anti-infectives (From admission, onward)    Start     Dose/Rate Route Frequency Ordered Stop   08/04/21 1730  doxycycline (VIBRAMYCIN) 100 mg in sodium chloride 0.9 % 250 mL IVPB        100 mg 125 mL/hr  over 120 Minutes Intravenous Every 12 hours 08/04/21 1650     08/04/21 1630  cefTRIAXone (ROCEPHIN) 2 g in sodium chloride 0.9 % 100 mL IVPB        2 g 200 mL/hr over 30 Minutes Intravenous Every 24 hours 08/04/21 1622 08/11/21 1629         Subjective: Walker Kehr today has no fevers, no emesis,  No chest pain,   -Complains of right foot drainage, discomfort,   Objective: Vitals:   08/05/21 0723 08/05/21 1214 08/05/21 2102 08/06/21 0510  BP: 128/74 137/71 139/72 (!) 127/57  Pulse: 75 66 64 72  Resp: 16 16 16 18   Temp: 97.8 F (36.6 C) 98.1 F (36.7 C) 98 F (36.7 C) 98.2 F (36.8 C)   TempSrc: Oral Oral  Oral  SpO2: 98% 98% 99% 98%  Weight:      Height:        Intake/Output Summary (Last 24 hours) at 08/06/2021 0850 Last data filed at 08/06/2021 0500 Gross per 24 hour  Intake 3878.97 ml  Output --  Net 3878.97 ml   Filed Weights   08/04/21 1822  Weight: 79.2 kg   Physical Exam  Gen:- Awake Alert,  in no apparent distress  HEENT:- [place].AT, No sclera icterus Neck-Supple Neck,No JVD,.  Lungs-  CTAB , fair symmetrical air movement CV- S1, S2 normal, regular  Abd-  +ve B.Sounds, Abd Soft, No tenderness,    Extremity/Skin:- No  edema, pedal pulses present  Psych-affect is appropriate, oriented x3 Neuro-no new focal deficits, no tremors MSK-right foot drainage appears purulent, erythema, warmth and swelling and tenderness over the right foot worse on the fibular side  Data Reviewed: I have personally reviewed following labs and imaging studies  CBC: Recent Labs  Lab 08/03/21 0749 08/04/21 1444 08/04/21 1807 08/05/21 0443  WBC 10.0 9.6 8.0 7.6  NEUTROABS 7.2 6.4  --   --   HGB 9.6* 9.0* 9.4* 9.6*  HCT 29.0* 27.4* 28.3* 30.1*  MCV 94.5 95.5 97.3 98.4  PLT 205 243 232 782   Basic Metabolic Panel: Recent Labs  Lab 08/03/21 0749 08/04/21 1444 08/05/21 0443  NA 135 138 139  K 4.4 4.6 4.3  CL 103 104 108  CO2 24 24 21*  GLUCOSE 152* 117* 101*  BUN 48* 54* 47*  CREATININE 2.98* 3.21* 2.75*  CALCIUM 9.1 9.7 9.0   GFR: Estimated Creatinine Clearance: 22.9 mL/min (A) (by C-G formula based on SCr of 2.75 mg/dL (H)). Liver Function Tests: Recent Labs  Lab 08/03/21 0749 08/04/21 1444  AST 12* 14*  ALT 10 12  ALKPHOS 55 62  BILITOT 0.6 0.3  PROT 7.7 7.9  ALBUMIN 3.9 4.1   No results for input(s): LIPASE, AMYLASE in the last 168 hours. No results for input(s): AMMONIA in the last 168 hours. Coagulation Profile: No results for input(s): INR, PROTIME in the last 168 hours. Cardiac Enzymes: No results for input(s): CKTOTAL, CKMB, CKMBINDEX,  TROPONINI in the last 168 hours. BNP (last 3 results) No results for input(s): PROBNP in the last 8760 hours. HbA1C: Recent Labs    08/04/21 1442  HGBA1C 6.3*   CBG: Recent Labs  Lab 08/05/21 0720 08/05/21 1102 08/05/21 1611 08/05/21 2129 08/06/21 0803  GLUCAP 108* 112* 97 84 78   Lipid Profile: No results for input(s): CHOL, HDL, LDLCALC, TRIG, CHOLHDL, LDLDIRECT in the last 72 hours. Thyroid Function Tests: No results for input(s): TSH, T4TOTAL, FREET4, T3FREE, THYROIDAB in the last 72 hours. Anemia Panel:  No results for input(s): VITAMINB12, FOLATE, FERRITIN, TIBC, IRON, RETICCTPCT in the last 72 hours. Urine analysis:    Component Value Date/Time   COLORURINE YELLOW 08/04/2021 1700   APPEARANCEUR CLEAR 08/04/2021 1700   LABSPEC 1.016 08/04/2021 1700   PHURINE 5.0 08/04/2021 1700   GLUCOSEU NEGATIVE 08/04/2021 1700   HGBUR NEGATIVE 08/04/2021 1700   BILIRUBINUR NEGATIVE 08/04/2021 1700   KETONESUR 5 (A) 08/04/2021 1700   PROTEINUR NEGATIVE 08/04/2021 1700   NITRITE NEGATIVE 08/04/2021 1700   LEUKOCYTESUR NEGATIVE 08/04/2021 1700   Sepsis Labs: @LABRCNTIP (procalcitonin:4,lacticidven:4)  ) Recent Results (from the past 240 hour(s))  Resp Panel by RT-PCR (Flu A&B, Covid) Nasopharyngeal Swab     Status: None   Collection Time: 08/04/21  2:42 PM   Specimen: Nasopharyngeal Swab; Nasopharyngeal(NP) swabs in vial transport medium  Result Value Ref Range Status   SARS Coronavirus 2 by RT PCR NEGATIVE NEGATIVE Final    Comment: (NOTE) SARS-CoV-2 target nucleic acids are NOT DETECTED.  The SARS-CoV-2 RNA is generally detectable in upper respiratory specimens during the acute phase of infection. The lowest concentration of SARS-CoV-2 viral copies this assay can detect is 138 copies/mL. A negative result does not preclude SARS-Cov-2 infection and should not be used as the sole basis for treatment or other patient management decisions. A negative result may occur with   improper specimen collection/handling, submission of specimen other than nasopharyngeal swab, presence of viral mutation(s) within the areas targeted by this assay, and inadequate number of viral copies(<138 copies/mL). A negative result must be combined with clinical observations, patient history, and epidemiological information. The expected result is Negative.  Fact Sheet for Patients:  EntrepreneurPulse.com.au  Fact Sheet for Healthcare Providers:  IncredibleEmployment.be  This test is no t yet approved or cleared by the [place] FDA and  has been authorized for detection and/or diagnosis of SARS-CoV-2 by FDA under an Emergency Use Authorization (EUA). This EUA will remain  in effect (meaning this test can be used) for the duration of the COVID-19 declaration under Section 564(b)(1) of the Act, 21 U.S.C.section 360bbb-3(b)(1), unless the authorization is terminated  or revoked sooner.       Influenza A by PCR NEGATIVE NEGATIVE Final   Influenza B by PCR NEGATIVE NEGATIVE Final    Comment: (NOTE) The Xpert Xpress SARS-CoV-2/FLU/RSV plus assay is intended as an aid in the diagnosis of influenza from Nasopharyngeal swab specimens and should not be used as a sole basis for treatment. Nasal washings and aspirates are unacceptable for Xpert Xpress SARS-CoV-2/FLU/RSV testing.  Fact Sheet for Patients: EntrepreneurPulse.com.au  Fact Sheet for Healthcare Providers: IncredibleEmployment.be  This test is not yet approved or cleared by the [place] FDA and has been authorized for detection and/or diagnosis of SARS-CoV-2 by FDA under an Emergency Use Authorization (EUA). This EUA will remain in effect (meaning this test can be used) for the duration of the COVID-19 declaration under Section 564(b)(1) of the Act, 21 U.S.C. section 360bbb-3(b)(1), unless the authorization is terminated  or revoked.  Performed at [facility], [address], [place], [place] [address]   Blood culture (routine x 2)     Status: None (Preliminary result)   Collection Time: 08/04/21  2:44 PM   Specimen: BLOOD LEFT ARM  Result Value Ref Range Status   Specimen Description BLOOD LEFT ARM  Final   Special Requests   Final    BOTTLES DRAWN AEROBIC AND ANAEROBIC Blood Culture adequate volume   Culture   Final  NO GROWTH < 24 HOURS Performed at [facility], [address], [place], [place] [address]    Report Status PENDING  Incomplete  Blood culture (routine x 2)     Status: None (Preliminary result)   Collection Time: 08/04/21  2:44 PM   Specimen: BLOOD RIGHT ARM  Result Value Ref Range Status   Specimen Description BLOOD RIGHT ARM  Final   Special Requests   Final    BOTTLES DRAWN AEROBIC AND ANAEROBIC Blood Culture adequate volume   Culture   Final    NO GROWTH < 24 HOURS Performed at [facility], [address], [place], [place] [address]    Report Status PENDING  Incomplete      Radiology Studies: CT FOOT RIGHT WO CONTRAST  Result Date: 08/04/2021 CLINICAL DATA:  Diabetic foot infection with right lateral foot wound. Pain and erythema dorsally for 4 days. EXAM: CT OF THE RIGHT FOOT WITHOUT CONTRAST TECHNIQUE: Multidetector CT imaging of the right foot was performed according to the standard protocol. Multiplanar CT image reconstructions were also generated. COMPARISON:  Radiographs 08/02/2021 FINDINGS: Bones/Joint/Cartilage Mild plantar and Achilles calcaneal spurs. No bony destructive findings characteristic of osteomyelitis. Ligaments Suboptimally assessed by CT. Muscles and Tendons Mild fusiform Achilles expansion compatible with tendinopathy. Soft tissues There is a wound along the lateral and plantar foot the level of the fifth MTP joint. Cutaneous disruption in the vicinity of this wound with subcutaneous density extending adjacent to the joint. I do not see an obvious joint  effusion or evidence of septic joint. A lesser degree of subcutaneous edema is present diffusely around the ankle and heel. Low-grade subcutaneous edema tracks in the dorsum of the foot and medially in the great toe. I do not observe findings characteristic of a drainable abscess. No significant abnormal gas tracking in the soft tissues. IMPRESSION: 1. Cutaneous and subcutaneous wound along the lateral and plantar foot at about the level of the fifth metatarsal. There is associated cutaneous irregularity and subcutaneous density favoring localized inflammation. I do not see an obvious drainable abscess or gas tracking in the soft tissues, and no definite effusion of the fifth MTP joint is identified. No bony destructive findings characteristic of osteomyelitis. 2. Fairly widespread edema along the ankle and foot, with some confluence medially in the great toe which could be from cellulitis/inflammation. 3. Mild distal Achilles tendinopathy. Electronically Signed   By: Van Clines M.D.   On: 08/04/2021 17:49     Scheduled Meds:  aspirin  81 mg Oral Daily   enoxaparin (LOVENOX) injection  30 mg Subcutaneous Q24H   gabapentin  100 mg Oral TID   insulin aspart  0-5 Units Subcutaneous QHS   insulin aspart  0-9 Units Subcutaneous TID WC   Continuous Infusions:  sodium chloride 125 mL/hr at 08/05/21 1040   cefTRIAXone (ROCEPHIN)  IV 2 g (08/05/21 1537)   doxycycline (VIBRAMYCIN) IV 100 mg (08/06/21 0453)     LOS: 2 days    Roxan Hockey M.D on 08/06/2021 at 8:50 AM  Go to [email or web address] - for contact info  [facility] - Office  [phone]  If 7PM-7AM, please contact night-coverage [email or web address] Password [email or web address] 08/06/2021, 8:50 AM

## 2021-08-07 DIAGNOSIS — L03115 Cellulitis of right lower limb: Secondary | ICD-10-CM | POA: Diagnosis not present

## 2021-08-07 LAB — CBC
HCT: 24.8 % — ABNORMAL LOW (ref 39.0–52.0)
Hemoglobin: 8.1 g/dL — ABNORMAL LOW (ref 13.0–17.0)
MCH: 31.9 pg (ref 26.0–34.0)
MCHC: 32.7 g/dL (ref 30.0–36.0)
MCV: 97.6 fL (ref 80.0–100.0)
Platelets: 200 10*3/uL (ref 150–400)
RBC: 2.54 MIL/uL — ABNORMAL LOW (ref 4.22–5.81)
RDW: 11.8 % (ref 11.5–15.5)
WBC: 5.4 10*3/uL (ref 4.0–10.5)
nRBC: 0 % (ref 0.0–0.2)

## 2021-08-07 LAB — BASIC METABOLIC PANEL
Anion gap: 9 (ref 5–15)
BUN: 24 mg/dL — ABNORMAL HIGH (ref 8–23)
CO2: 19 mmol/L — ABNORMAL LOW (ref 22–32)
Calcium: 8.4 mg/dL — ABNORMAL LOW (ref 8.9–10.3)
Chloride: 112 mmol/L — ABNORMAL HIGH (ref 98–111)
Creatinine, Ser: 1.91 mg/dL — ABNORMAL HIGH (ref 0.61–1.24)
GFR, Estimated: 37 mL/min — ABNORMAL LOW (ref >60)
Glucose, Bld: 112 mg/dL — ABNORMAL HIGH (ref 70–99)
Potassium: 4.7 mmol/L (ref 3.5–5.1)
Sodium: 140 mmol/L (ref 135–145)

## 2021-08-07 LAB — GLUCOSE, CAPILLARY
Glucose-Capillary: 68 mg/dL — ABNORMAL LOW (ref 70–99)
Glucose-Capillary: 73 mg/dL (ref 70–99)
Glucose-Capillary: 84 mg/dL (ref 70–99)
Glucose-Capillary: 103 mg/dL — ABNORMAL HIGH (ref 70–99)

## 2021-08-07 MED ORDER — ASPIRIN EC 81 MG PO TBEC: 81.0000 mg | DELAYED_RELEASE_TABLET | Freq: Every day | ORAL | 2 refills | Status: AC

## 2021-08-07 MED ORDER — DOXYCYCLINE HYCLATE 100 MG PO TABS
100.0000 mg | ORAL_TABLET | Freq: Once | ORAL | Status: AC
  Administered 2021-08-07: 100 mg via ORAL
  Filled 2021-08-07: qty 1

## 2021-08-07 MED ORDER — SODIUM CHLORIDE 0.9 % IV SOLN
2.0000 g | Freq: Once | INTRAVENOUS | Status: AC
  Administered 2021-08-07: 2 g via INTRAVENOUS
  Filled 2021-08-07: qty 20

## 2021-08-07 MED ORDER — GLIMEPIRIDE 2 MG PO TABS: 2.0000 mg | ORAL_TABLET | Freq: Two times a day (BID) | ORAL | 11 refills | Status: DC

## 2021-08-07 MED ORDER — CEPHALEXIN 500 MG PO CAPS: 500.0000 mg | ORAL_CAPSULE | Freq: Three times a day (TID) | ORAL | 0 refills | Status: AC

## 2021-08-07 MED ORDER — ACETAMINOPHEN 325 MG PO TABS
650.0000 mg | ORAL_TABLET | Freq: Four times a day (QID) | ORAL | 0 refills | Status: DC | PRN
Start: 2021-08-07 — End: 2023-09-12

## 2021-08-07 MED ORDER — DOXYCYCLINE HYCLATE 100 MG PO TABS
100.0000 mg | ORAL_TABLET | Freq: Two times a day (BID) | ORAL | 0 refills | Status: AC
Start: 2021-08-07 — End: 2021-08-12

## 2021-08-07 MED ORDER — AMLODIPINE BESYLATE 10 MG PO TABS: 10.0000 mg | ORAL_TABLET | Freq: Every day | ORAL | 11 refills | Status: DC

## 2021-08-07 NOTE — Discharge Instructions (Addendum)
1)Avoid ibuprofen/Advil/Aleve/Motrin/Goody Powders/Naproxen/BC powders/Meloxicam/Diclofenac/Indomethacin and other Nonsteroidal anti-inflammatory medications as these will make you more likely to bleed and can cause stomach ulcers, can also cause Kidney problems.   2)follow up with Podiatrist  Dr. Charleen Kirks Patel/Dr Caprice Beaver within 1 week for recheck and reevaluation-at [facility], -Address: [address] [place], [place] [address] Phone: [phone]  3)Dressing procedure/placement/frequency: Topical treatment orders provided for bedside nurses to perform to promote healing:  Apply Xeroform gauze to anterior/posterior right foot wound Q day, then cover with gauze and kerlex. Cleanse with NS each time and gently scrub to assist with removal of loose skin.  4)Stop metformin/Glucophage due to kidney concerns, take Amaryl/glimepiride 2 mg twice daily with meals in place of metformin  5)Avoid ibuprofen/Advil/Aleve/Motrin/Goody Powders/Naproxen/BC powders/Meloxicam/Diclofenac/Indomethacin and other Nonsteroidal anti-inflammatory medications as these will make you more likely to bleed and can cause stomach ulcers, can also cause Kidney problems.   6) repeat CBC and BMP blood test within a week advised  7) stop losartan due to kidney concerns, take amlodipine instead for now for blood pressure

## 2021-08-07 NOTE — Discharge Summary (Signed)
Collin Yoder, is a 70 y.o. male  DOB 12-23-1950  MRN [id number].  Admission date:  08/04/2021  Admitting Physician  Bethena Roys, MD  Discharge Date:  08/07/2021   Primary MD  Pcp, No  Recommendations for primary care physician for things to follow:  1)Avoid ibuprofen/Advil/Aleve/Motrin/Goody Powders/Naproxen/BC powders/Meloxicam/Diclofenac/Indomethacin and other Nonsteroidal anti-inflammatory medications as these will make you more likely to bleed and can cause stomach ulcers, can also cause Kidney problems.   2)follow up with Podiatrist  Dr. Charleen Kirks Patel/Dr Caprice Beaver within 1 week for recheck and reevaluation-at [facility], -Address: [address] [place], [place] [address] Phone: [phone]  3)Dressing procedure/placement/frequency: Topical treatment orders provided for bedside nurses to perform to promote healing:  Apply Xeroform gauze to anterior/posterior right foot wound Q day, then cover with gauze and kerlex. Cleanse with NS each time and gently scrub to assist with removal of loose skin.  4)Stop metformin/Glucophage due to kidney concerns, take Amaryl/glimepiride 2 mg twice daily with meals in place of metformin  5)Avoid ibuprofen/Advil/Aleve/Motrin/Goody Powders/Naproxen/BC powders/Meloxicam/Diclofenac/Indomethacin and other Nonsteroidal anti-inflammatory medications as these will make you more likely to bleed and can cause stomach ulcers, can also cause Kidney problems.   6) repeat CBC and BMP blood test within a week advised  7) stop losartan due to kidney concerns, take amlodipine instead for now for blood pressure   Admission Diagnosis  Cellulitis of right foot [L03.115] AKI (acute kidney injury) ([place]) [N17.9] Diabetic foot infection ([place]) [B55.974, L08.9]   Discharge Diagnosis  Cellulitis of right foot [L03.115] AKI (acute kidney injury)  ([place]) [N17.9] Diabetic foot infection ([place]) [B63.845, L08.9]    Principal Problem:   Cellulitis of right foot Active Problems:   Diabetes mellitus ([place])   Hypertension   AKI (acute kidney injury) ([place])      Past Medical History:  Diagnosis Date   Borderline diabetes    Peripheral neuropathy 07/15/2019   Right lower lobe lung mass 04/11/2017   Thalamic stroke ([place]) 07/15/2019   Type II diabetes mellitus, uncontrolled 07/15/2019   Vitamin D deficiency disease 07/15/2019    Past Surgical History:  Procedure Laterality Date   APPENDECTOMY     CATARACT EXTRACTION, BILATERAL Bilateral 01/2021      HPI  from the history and physical done on the day of admission:   Chief Complaint: Right foot pain and swelling   HPI: Collin Yoder is a 70 y.o. male with medical history significant for HTN, DM, thalamic stroke. Patient presented to the ED with complaints of pain and discharge from his right foot.  Symptoms started as a callus underneath his right foot, but over the past 3 days, this callus extended to the side of his leg, and developed a wound that started draining.  No fever no chills.  She has maintained good oral intake.  No vomiting no loose stools.  No dizziness.   Patient was in the ED yesterday 11/2 and day before 11/1 with similar problems, but he left AMA.  ED Course: Temperature 97.9.  Heart rate 93-103.  Respirate rate 14-16.  Blood pressure systolic 315 400.  O2 sats greater than 96% on room air.  WBC 9.6.  Lactic acid 1.1.  Creatinine elevated 3.21.  X-ray of the right foot on 11/1 without acute bony abnormality.  No bone destruction to suggest osteomyelitis. Hospitalist admit for diabetic wound infection and acute kidney injury.      Hospital Course:     Brief Narrative:  70 y.o. male with medical history significant for HTN, DM, thalamic stroke admitted on 08/04/2021 with right foot cellulitis/third callus/diabetic wound     Assessment & Plan:   Principal  Problem:   Cellulitis of right foot Active Problems:   Diabetes mellitus ([place])   Hypertension   AKI (acute kidney injury) ([place])   1)right foot MSSA cellulitis/third callus/diabetic wound-- --CT of the right foot consistent with cellulitis and distal Achilles tendinopathy without osteomyelitis -Wound culture obtained -Wound care consult requested and appreciated Patient was treated with IV Rocephin and doxycycline  -Overall improved right foot swelling, warmth, tenderness and erythema  -Okay to discharge on p.o. Keflex and doxycycline   2)AKI---- -Creatinine this admission peaked at 3.21, baseline usually around 1.0 -Creatinine trended down with hydration -Discharge creatinine 1.9 -Continue to hold metformin and losartan renally adjust medications, avoid nephrotoxic agents / dehydration  / hypotension   3)HTN--continue to hold losartan due to AKI concerns, use amlodipine instead   4)Social issues-patient has an ankle bracelet that should be coming off next month (said he was involved with someone who gave him the impression that she was older than she was).  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     Disposition--discharge home   Disposition: The patient is from: Home              Anticipated d/c is to: Home              Code Status :  -  Code Status: Full Code    Family Communication:   NA (patient is alert, awake and coherent)    Consults  :  wound care  Discharge Condition: stable  Follow UP--outpatient follow-up with podiatrist advised  Diet and Activity recommendation:  As advised  Discharge Instructions    Discharge Instructions     Call MD for:  persistant nausea and vomiting   Complete by: As directed    Call MD for:  redness, tenderness, or signs of infection (pain, swelling, redness, odor or green/yellow discharge around incision site)   Complete by: As directed     Call MD for:  severe uncontrolled pain   Complete by: As directed    Call MD for:  temperature >100.4   Complete by: As directed    Diet - low sodium heart healthy   Complete by: As directed    Diet Carb Modified   Complete by: As directed    Discharge instructions   Complete by: As directed    1)Avoid ibuprofen/Advil/Aleve/Motrin/Goody Powders/Naproxen/BC powders/Meloxicam/Diclofenac/Indomethacin and other Nonsteroidal anti-inflammatory medications as these will make you more likely to bleed and can cause stomach ulcers, can also cause Kidney problems.   2)follow up with Podiatrist  Dr. Charleen Kirks Patel/Dr Caprice Beaver within 1 week for recheck and reevaluation-at [facility], -Address: [address], [place], [place] [address] Phone: [phone]  3)Dressing procedure/placement/frequency: Topical treatment orders provided for  bedside nurses to perform to promote healing:  Apply Xeroform gauze to anterior/posterior right foot wound Q day, then cover with gauze and kerlex. Cleanse with NS each time and gently scrub to assist with removal of loose skin.  4)Stop metformin/Glucophage due to kidney concerns, take Amaryl/glimepiride 2 mg twice daily with meals in place of metformin  5)Avoid ibuprofen/Advil/Aleve/Motrin/Goody Powders/Naproxen/BC powders/Meloxicam/Diclofenac/Indomethacin and other Nonsteroidal anti-inflammatory medications as these will make you more likely to bleed and can cause stomach ulcers, can also cause Kidney problems.   6) repeat CBC and BMP blood test within a week advised  7) stop losartan due to kidney concerns, take amlodipine instead for now for blood pressure   Discharge wound care:   Complete by: As directed    Dressing procedure/placement/frequency: Topical treatment orders provided for bedside nurses to perform to promote healing:  Apply Xeroform gauze to anterior/posterior right foot wound Q day, then cover with gauze and kerlex.  Cleanse with NS each time and gently scrub to assist with removal of loose skin.   Increase activity slowly   Complete by: As directed        Discharge Medications     Allergies as of 08/07/2021   No Known Allergies      Medication List     STOP taking these medications    aspirin 81 MG chewable tablet Replaced by: aspirin EC 81 MG tablet   losartan 50 MG tablet Commonly known as: COZAAR   metFORMIN 500 MG tablet Commonly known as: GLUCOPHAGE       TAKE these medications    acetaminophen 325 MG tablet Commonly known as: TYLENOL Take 2 tablets (650 mg total) by mouth every 6 (six) hours as needed for mild pain (or Fever >/= 101).   amLODipine 10 MG tablet Commonly known as: NORVASC Take 1 tablet (10 mg total) by mouth daily.   aspirin EC 81 MG tablet Take 1 tablet (81 mg total) by mouth daily with breakfast. Replaces: aspirin 81 MG chewable tablet   B-D SINGLE USE SWABS REGULAR Pads 1 Units by Does not apply route daily at 12 noon.   blood glucose meter kit and supplies Kit Dispense based on patient and insurance preference. Use up to four times daily as directed. (FOR ICD-9 250.00, 250.01).   cephALEXin 500 MG capsule Commonly known as: Keflex Take 1 capsule (500 mg total) by mouth 3 (three) times daily for 5 days.   doxycycline 100 MG tablet Commonly known as: VIBRA-TABS Take 1 tablet (100 mg total) by mouth 2 (two) times daily for 5 days.   gabapentin 100 MG capsule Commonly known as: NEURONTIN Take 2 capsules (200 mg total) by mouth 3 (three) times daily.   glimepiride 2 MG tablet Commonly known as: Amaryl Take 1 tablet (2 mg total) by mouth 2 (two) times daily before a meal. In Place of Metformin   True Metrix Blood Glucose Test test strip Generic drug: glucose blood Use as instructed   True Metrix Meter w/Device Kit USE AS DIRECTED   TRUEplus Lancets 33G Misc 1 Units by Does not apply route daily at 12 noon.   Vitamin D3 125 MCG (5000  UT) Caps Take 1 capsule by mouth daily.               Discharge Care Instructions  (From admission, onward)           Start     Ordered   08/07/21 0000  Discharge wound care:  Comments: Dressing procedure/placement/frequency: Topical treatment orders provided for bedside nurses to perform to promote healing:  Apply Xeroform gauze to anterior/posterior right foot wound Q day, then cover with gauze and kerlex. Cleanse with NS each time and gently scrub to assist with removal of loose skin.   08/07/21 1241            Major procedures and Radiology Reports - PLEASE review detailed and final reports for all details, in brief -    CT FOOT RIGHT WO CONTRAST  Result Date: 08/04/2021 CLINICAL DATA:  Diabetic foot infection with right lateral foot wound. Pain and erythema dorsally for 4 days. EXAM: CT OF THE RIGHT FOOT WITHOUT CONTRAST TECHNIQUE: Multidetector CT imaging of the right foot was performed according to the standard protocol. Multiplanar CT image reconstructions were also generated. COMPARISON:  Radiographs 08/02/2021 FINDINGS: Bones/Joint/Cartilage Mild plantar and Achilles calcaneal spurs. No bony destructive findings characteristic of osteomyelitis. Ligaments Suboptimally assessed by CT. Muscles and Tendons Mild fusiform Achilles expansion compatible with tendinopathy. Soft tissues There is a wound along the lateral and plantar foot the level of the fifth MTP joint. Cutaneous disruption in the vicinity of this wound with subcutaneous density extending adjacent to the joint. I do not see an obvious joint effusion or evidence of septic joint. A lesser degree of subcutaneous edema is present diffusely around the ankle and heel. Low-grade subcutaneous edema tracks in the dorsum of the foot and medially in the great toe. I do not observe findings characteristic of a drainable abscess. No significant abnormal gas tracking in the soft tissues. IMPRESSION: 1. Cutaneous and  subcutaneous wound along the lateral and plantar foot at about the level of the fifth metatarsal. There is associated cutaneous irregularity and subcutaneous density favoring localized inflammation. I do not see an obvious drainable abscess or gas tracking in the soft tissues, and no definite effusion of the fifth MTP joint is identified. No bony destructive findings characteristic of osteomyelitis. 2. Fairly widespread edema along the ankle and foot, with some confluence medially in the great toe which could be from cellulitis/inflammation. 3. Mild distal Achilles tendinopathy. Electronically Signed   By: Van Clines M.D.   On: 08/04/2021 17:49   DG Foot Complete Right  Result Date: 08/02/2021 CLINICAL DATA:  Right foot wound near 5th MTP EXAM: RIGHT FOOT COMPLETE - 3+ VIEW COMPARISON:  None. FINDINGS: No acute bony abnormality. Specifically, no fracture, subluxation, or dislocation. No bone destruction to suggest osteomyelitis. No soft tissue gas. IMPRESSION: No acute bony abnormality. Electronically Signed   By: Rolm Baptise M.D.   On: 08/02/2021 18:11    Micro Results   Recent Results (from the past 240 hour(s))  Resp Panel by RT-PCR (Flu A&B, Covid) Nasopharyngeal Swab     Status: None   Collection Time: 08/04/21  2:42 PM   Specimen: Nasopharyngeal Swab; Nasopharyngeal(NP) swabs in vial transport medium  Result Value Ref Range Status   SARS Coronavirus 2 by RT PCR NEGATIVE NEGATIVE Final    Comment: (NOTE) SARS-CoV-2 target nucleic acids are NOT DETECTED.  The SARS-CoV-2 RNA is generally detectable in upper respiratory specimens during the acute phase of infection. The lowest concentration of SARS-CoV-2 viral copies this assay can detect is 138 copies/mL. A negative result does not preclude SARS-Cov-2 infection and should not be used as the sole basis for treatment or other patient management decisions. A negative result may occur with  improper specimen collection/handling,  submission of specimen other than nasopharyngeal swab, presence of viral  mutation(s) within the areas targeted by this assay, and inadequate number of viral copies(<138 copies/mL). A negative result must be combined with clinical observations, patient history, and epidemiological information. The expected result is Negative.  Fact Sheet for Patients:  EntrepreneurPulse.com.au  Fact Sheet for Healthcare Providers:  IncredibleEmployment.be  This test is no t yet approved or cleared by the [place] FDA and  has been authorized for detection and/or diagnosis of SARS-CoV-2 by FDA under an Emergency Use Authorization (EUA). This EUA will remain  in effect (meaning this test can be used) for the duration of the COVID-19 declaration under Section 564(b)(1) of the Act, 21 U.S.C.section 360bbb-3(b)(1), unless the authorization is terminated  or revoked sooner.       Influenza A by PCR NEGATIVE NEGATIVE Final   Influenza B by PCR NEGATIVE NEGATIVE Final    Comment: (NOTE) The Xpert Xpress SARS-CoV-2/FLU/RSV plus assay is intended as an aid in the diagnosis of influenza from Nasopharyngeal swab specimens and should not be used as a sole basis for treatment. Nasal washings and aspirates are unacceptable for Xpert Xpress SARS-CoV-2/FLU/RSV testing.  Fact Sheet for Patients: EntrepreneurPulse.com.au  Fact Sheet for Healthcare Providers: IncredibleEmployment.be  This test is not yet approved or cleared by the [place] FDA and has been authorized for detection and/or diagnosis of SARS-CoV-2 by FDA under an Emergency Use Authorization (EUA). This EUA will remain in effect (meaning this test can be used) for the duration of the COVID-19 declaration under Section 564(b)(1) of the Act, 21 U.S.C. section 360bbb-3(b)(1), unless the authorization is terminated or revoked.  Performed at [facility], [address], [place], [place] [address]   Blood culture (routine x 2)     Status: None (Preliminary result)   Collection Time: 08/04/21  2:44 PM   Specimen: BLOOD LEFT ARM  Result Value Ref Range Status   Specimen Description BLOOD LEFT ARM  Final   Special Requests   Final    BOTTLES DRAWN AEROBIC AND ANAEROBIC Blood Culture adequate volume   Culture   Final    NO GROWTH 2 DAYS Performed at [facility], [address] [place], [place] [address]    Report Status PENDING  Incomplete  Blood culture (routine x 2)     Status: None (Preliminary result)   Collection Time: 08/04/21  2:44 PM   Specimen: BLOOD RIGHT ARM  Result Value Ref Range Status   Specimen Description BLOOD RIGHT ARM  Final   Special Requests   Final    BOTTLES DRAWN AEROBIC AND ANAEROBIC Blood Culture adequate volume   Culture   Final    NO GROWTH 2 DAYS Performed at [facility], [address] [place], [place] [address]    Report Status PENDING  Incomplete  Aerobic Culture w Gram Stain (superficial specimen)     Status: None (Preliminary result)   Collection Time: 08/06/21  9:22 AM   Specimen: Foot  Result Value Ref Range Status   Specimen Description   Final    FOOT Performed at [facility], [address] [place], [place] [address]    Special Requests   Final    RIGHT Performed at [facility], [address] [place],  [address]    Gram Stain   Final    NO SQUAMOUS EPITHELIAL CELLS SEEN FEW WBC SEEN FEW GRAM POSITIVE COCCI    Culture   Final    CULTURE REINCUBATED FOR BETTER GROWTH Performed at [facility], [place] [address],  [place], [place] [address]    Report Status PENDING  Incomplete   Today   Subjective    Collin Yoder today has no new concerns No fever  Or chills            Patient has been seen and examined prior to discharge   Objective   Blood pressure (!) 165/85, pulse 72, temperature 98 F (36.7 C), temperature source Oral, resp. rate 19, height 5' 7.25" (1.708 m), weight  79.2 kg, SpO2 99 %.   Intake/Output Summary (Last 24 hours) at 08/07/2021 1246 Last data filed at 08/07/2021 0900 Gross per 24 hour  Intake 1748.89 ml  Output --  Net 1748.89 ml    Exam Gen:- Awake Alert,  in no apparent distress  HEENT:- [place].AT, No sclera icterus Neck-Supple Neck,No JVD,.  Lungs-  CTAB , fair symmetrical air movement CV- S1, S2 normal, regular  Abd-  +ve B.Sounds, Abd Soft, No tenderness,    Extremity/Skin:- No  edema, pedal pulses present  Psych-affect is appropriate, oriented x3 Neuro-no new focal deficits, no tremors MSK-much improved right foot erythema, warmth and swelling and tenderness over the right foot worse on the fibular side   Data Review   CBC w Diff:  Lab Results  Component Value Date   WBC 5.4 08/07/2021   HGB 8.1 (L) 08/07/2021   HCT 24.8 (L) 08/07/2021   PLT 200 08/07/2021   LYMPHOPCT 19 08/04/2021   MONOPCT 8 08/04/2021   EOSPCT 4 08/04/2021   BASOPCT 1 08/04/2021    CMP:  Lab Results  Component Value Date   NA 140 08/07/2021   K 4.7 08/07/2021   CL 112 (H) 08/07/2021   CO2 19 (L) 08/07/2021   BUN 24 (H) 08/07/2021   CREATININE 1.91 (H) 08/07/2021   CREATININE 1.09 03/01/2021   PROT 7.9 08/04/2021   ALBUMIN 4.1 08/04/2021   BILITOT 0.3 08/04/2021   ALKPHOS 62 08/04/2021   AST 14 (L) 08/04/2021   ALT 12 08/04/2021  .   Total Discharge time is about 33 minutes  Roxan Hockey M.D on 08/07/2021 at 12:46 PM  Go to [email or web address] -  for contact info  [facility] - Office  [phone]

## 2021-08-07 NOTE — Progress Notes (Signed)
Nsg Discharge Note  Admit Date:  08/04/2021 Discharge date: 08/07/2021   Jamison Oka to be D/C'd Home per MD order.  AVS completed. Patient/caregiver able to verbalize understanding.  Discharge Medication: Allergies as of 08/07/2021   No Known Allergies      Medication List     STOP taking these medications    aspirin 81 MG chewable tablet Replaced by: aspirin EC 81 MG tablet   losartan 50 MG tablet Commonly known as: COZAAR   metFORMIN 500 MG tablet Commonly known as: GLUCOPHAGE       TAKE these medications    acetaminophen 325 MG tablet Commonly known as: TYLENOL Take 2 tablets (650 mg total) by mouth every 6 (six) hours as needed for mild pain (or Fever >/= 101).   amLODipine 10 MG tablet Commonly known as: NORVASC Take 1 tablet (10 mg total) by mouth daily.   aspirin EC 81 MG tablet Take 1 tablet (81 mg total) by mouth daily with breakfast. Replaces: aspirin 81 MG chewable tablet   B-D SINGLE USE SWABS REGULAR Pads 1 Units by Does not apply route daily at 12 noon.   blood glucose meter kit and supplies Kit Dispense based on patient and insurance preference. Use up to four times daily as directed. (FOR ICD-9 250.00, 250.01).   cephALEXin 500 MG capsule Commonly known as: Keflex Take 1 capsule (500 mg total) by mouth 3 (three) times daily for 5 days.   doxycycline 100 MG tablet Commonly known as: VIBRA-TABS Take 1 tablet (100 mg total) by mouth 2 (two) times daily for 5 days.   gabapentin 100 MG capsule Commonly known as: NEURONTIN Take 2 capsules (200 mg total) by mouth 3 (three) times daily.   glimepiride 2 MG tablet Commonly known as: Amaryl Take 1 tablet (2 mg total) by mouth 2 (two) times daily before a meal. In Place of Metformin   True Metrix Blood Glucose Test test strip Generic drug: glucose blood Use as instructed   True Metrix Meter w/Device Kit USE AS DIRECTED   TRUEplus Lancets 33G Misc 1 Units by Does not apply route daily  at 12 noon.   Vitamin D3 125 MCG (5000 UT) Caps Take 1 capsule by mouth daily.               Discharge Care Instructions  (From admission, onward)           Start     Ordered   08/07/21 0000  Discharge wound care:       Comments: Dressing procedure/placement/frequency: Topical treatment orders provided for bedside nurses to perform to promote healing:  Apply Xeroform gauze to anterior/posterior right foot wound Q day, then cover with gauze and kerlex. Cleanse with NS each time and gently scrub to assist with removal of loose skin.   08/07/21 1241            Discharge Assessment: Vitals:   08/06/21 2132 08/07/21 0530  BP: (!) 154/73 (!) 165/85  Pulse: 65 72  Resp: 19 19  Temp: 98.5 F (36.9 C) 98 F (36.7 C)  SpO2: 95% 99%   Skin clean, dry and intact without evidence of skin break down, no evidence of skin tears noted. IV catheter discontinued intact. Site without signs and symptoms of complications - no redness or edema noted at insertion site, patient denies c/o pain - only slight tenderness at site.  Dressing with slight pressure applied.  D/c Instructions-Education: Discharge instructions given to patient/family with verbalized understanding.  D/c education completed with patient/family including follow up instructions, medication list, d/c activities limitations if indicated, with other d/c instructions as indicated by MD - patient able to verbalize understanding, all questions fully answered. Patient instructed to return to ED, call 911, or call MD for any changes in condition.  Patient escorted via [place], and D/C home via private auto.  Kathie Rhodes, RN 08/07/2021 1:19 PM

## 2021-08-09 LAB — CULTURE, BLOOD (ROUTINE X 2)
Culture: NO GROWTH
Culture: NO GROWTH
Special Requests: ADEQUATE
Special Requests: ADEQUATE

## 2021-08-09 LAB — AEROBIC CULTURE W GRAM STAIN (SUPERFICIAL SPECIMEN): Gram Stain: NONE SEEN

## 2021-08-30 DIAGNOSIS — L97511 Non-pressure chronic ulcer of other part of right foot limited to breakdown of skin: Secondary | ICD-10-CM | POA: Diagnosis not present

## 2021-09-13 DIAGNOSIS — L97511 Non-pressure chronic ulcer of other part of right foot limited to breakdown of skin: Secondary | ICD-10-CM | POA: Diagnosis not present

## 2021-10-04 DIAGNOSIS — H3581 Retinal edema: Secondary | ICD-10-CM | POA: Diagnosis not present

## 2021-10-04 DIAGNOSIS — E113393 Type 2 diabetes mellitus with moderate nonproliferative diabetic retinopathy without macular edema, bilateral: Secondary | ICD-10-CM | POA: Diagnosis not present

## 2021-10-04 DIAGNOSIS — H43823 Vitreomacular adhesion, bilateral: Secondary | ICD-10-CM | POA: Diagnosis not present

## 2021-10-04 DIAGNOSIS — H34831 Tributary (branch) retinal vein occlusion, right eye, with macular edema: Secondary | ICD-10-CM | POA: Diagnosis not present

## 2021-10-04 LAB — HM DIABETES EYE EXAM

## 2021-10-05 ENCOUNTER — Other Ambulatory Visit (INDEPENDENT_AMBULATORY_CARE_PROVIDER_SITE_OTHER): Payer: Self-pay | Admitting: Nurse Practitioner

## 2021-10-05 DIAGNOSIS — G63 Polyneuropathy in diseases classified elsewhere: Secondary | ICD-10-CM

## 2021-10-05 DIAGNOSIS — E1165 Type 2 diabetes mellitus with hyperglycemia: Secondary | ICD-10-CM

## 2021-10-12 ENCOUNTER — Ambulatory Visit (INDEPENDENT_AMBULATORY_CARE_PROVIDER_SITE_OTHER): Payer: Medicare HMO | Admitting: Internal Medicine

## 2021-10-12 ENCOUNTER — Encounter: Payer: Self-pay | Admitting: Internal Medicine

## 2021-10-12 ENCOUNTER — Other Ambulatory Visit: Payer: Self-pay

## 2021-10-12 VITALS — BP 132/74 | HR 65 | Resp 18 | Ht 67.0 in | Wt 187.1 lb

## 2021-10-12 DIAGNOSIS — E782 Mixed hyperlipidemia: Secondary | ICD-10-CM

## 2021-10-12 DIAGNOSIS — G63 Polyneuropathy in diseases classified elsewhere: Secondary | ICD-10-CM | POA: Diagnosis not present

## 2021-10-12 DIAGNOSIS — D509 Iron deficiency anemia, unspecified: Secondary | ICD-10-CM | POA: Diagnosis not present

## 2021-10-12 DIAGNOSIS — I10 Essential (primary) hypertension: Secondary | ICD-10-CM | POA: Diagnosis not present

## 2021-10-12 DIAGNOSIS — Z1211 Encounter for screening for malignant neoplasm of colon: Secondary | ICD-10-CM

## 2021-10-12 DIAGNOSIS — J432 Centrilobular emphysema: Secondary | ICD-10-CM | POA: Insufficient documentation

## 2021-10-12 DIAGNOSIS — E1169 Type 2 diabetes mellitus with other specified complication: Secondary | ICD-10-CM | POA: Diagnosis not present

## 2021-10-12 DIAGNOSIS — I700 Atherosclerosis of aorta: Secondary | ICD-10-CM | POA: Diagnosis not present

## 2021-10-12 DIAGNOSIS — Z720 Tobacco use: Secondary | ICD-10-CM

## 2021-10-12 DIAGNOSIS — E119 Type 2 diabetes mellitus without complications: Secondary | ICD-10-CM | POA: Diagnosis not present

## 2021-10-12 DIAGNOSIS — Z122 Encounter for screening for malignant neoplasm of respiratory organs: Secondary | ICD-10-CM

## 2021-10-12 DIAGNOSIS — Z8673 Personal history of transient ischemic attack (TIA), and cerebral infarction without residual deficits: Secondary | ICD-10-CM

## 2021-10-12 MED ORDER — ATORVASTATIN CALCIUM 40 MG PO TABS: 40.0000 mg | ORAL_TABLET | Freq: Every day | ORAL | 3 refills | Status: DC

## 2021-10-12 NOTE — Progress Notes (Signed)
New Patient Office Visit  Subjective:  Patient ID: Collin Yoder, male    DOB: 01-Feb-1951  Age: 71 y.o. MRN: [id number]  CC:  Chief Complaint  Patient presents with   New Patient (Initial Visit)    New patient was seeing gosrani just establishing care    HPI Collin Yoder is a 71 y.o. male with past medical history of CVA, HTN, COPD, type II DM with neuropathy and tobacco abuse who presents for establishing care.  History of CVA: He had an ischemic CVA in 2020, but he did not stay in the hospital at that time.  He was taking aspirin after that, but stopped taking it later.  He is not on statin currently.  He does not have any major neurologic deficit except mild right hand resting tremors.  HTN: He takes amlodipine for HTN.  He was on losartan, but was switched to amlodipine due to AKI recently when he was in hospital for diabetic foot infection. BP is well-controlled. Takes medications regularly. Patient denies headache, dizziness, chest pain, dyspnea or palpitations.  Type II DM with neuropathy: He takes glimepiride 2 mg twice daily.  He checks his blood glucose regularly, which is around 90-120 most of the time.  He was on metformin, but was recently switched to Glimepiride due to AKI.  He denies any fatigue, polyuria or polydipsia currently.  He smokes about 1 pack/day.  He denies any dyspnea or wheezing currently.  CT chest in the past showed signs of emphysema.  He was recently hospitalized for diabetic foot infection, and followed up with podiatry in the outpatient setting.  He is wound has healed well.  He denies any discharge or bleeding from the area now.  Past Medical History:  Diagnosis Date   Borderline diabetes    Peripheral neuropathy 07/15/2019   Right lower lobe lung mass 04/11/2017   Thalamic stroke ([place]) 07/15/2019   Type II diabetes mellitus, uncontrolled 07/15/2019   Vitamin D deficiency disease 07/15/2019    Past Surgical History:  Procedure  Laterality Date   APPENDECTOMY     CATARACT EXTRACTION, BILATERAL Bilateral 01/2021    Family History  Problem Relation Age of Onset   Diabetes Mother 22   Stroke Mother    Colon cancer Neg Hx     Social History   Socioeconomic History   Marital status: Single    Spouse name: Not on file   Number of children: Not on file   Years of education: Not on file   Highest education level: Not on file  Occupational History   Not on file  Tobacco Use   Smoking status: Every Day    Packs/day: 1.00    Years: 45.00    Pack years: 45.00    Types: Cigarettes   Smokeless tobacco: Never  Vaping Use   Vaping Use: Never used  Substance and Sexual Activity   Alcohol use: Yes    Alcohol/week: 1.0 standard drink    Types: 1 Cans of beer per week    Comment: every 3 days   Drug use: No   Sexual activity: Not on file  Other Topics Concern   Not on file  Social History Narrative   Single.Lives alone in a motel.Works at [employer].   Social Determinants of Health   Financial Resource Strain: Not on file  Food Insecurity: Not on file  Transportation Needs: Not on file  Physical Activity: Not on file  Stress: Not on file  Social Connections:  Not on file  Intimate Partner Violence: Not on file    ROS Review of Systems  Constitutional:  Negative for chills and fever.  HENT:  Negative for congestion and sore throat.   Eyes:  Negative for pain and discharge.  Respiratory:  Negative for cough and shortness of breath.   Cardiovascular:  Negative for chest pain and palpitations.  Gastrointestinal:  Negative for diarrhea, nausea and vomiting.  Endocrine: Negative for polydipsia and polyuria.  Genitourinary:  Negative for dysuria and hematuria.  Musculoskeletal:  Negative for neck pain and neck stiffness.  Skin:  Positive for wound. Negative for rash.  Neurological:  Positive for tremors. Negative for dizziness, weakness, numbness and headaches.  Psychiatric/Behavioral:  Negative  for agitation and behavioral problems.    Objective:   Today's Vitals: BP 132/74 (BP Location: Left Arm, Patient Position: Sitting, Cuff Size: Normal)    Pulse 65    Resp 18    Ht 5' 7"  (1.702 m)    Wt 187 lb 1.3 oz (84.9 kg)    SpO2 94%    BMI 29.30 kg/m   Physical Exam Vitals reviewed.  Constitutional:      General: He is not in acute distress.    Appearance: He is not diaphoretic.  HENT:     Head: Normocephalic and atraumatic.     Nose: Nose normal.     Mouth/Throat:     Mouth: Mucous membranes are moist.  Eyes:     General: No scleral icterus.    Extraocular Movements: Extraocular movements intact.  Cardiovascular:     Rate and Rhythm: Normal rate and regular rhythm.     Pulses: Normal pulses.     Heart sounds: Normal heart sounds. No murmur heard. Pulmonary:     Breath sounds: Normal breath sounds. No wheezing or rales.  Abdominal:     Palpations: Abdomen is soft.     Tenderness: There is no abdominal tenderness.  Musculoskeletal:     Cervical back: Neck supple. No tenderness.     Right lower leg: No edema.     Left lower leg: No edema.  Skin:    General: Skin is warm.     Findings: No rash.  Neurological:     General: No focal deficit present.     Mental Status: He is alert and oriented to person, place, and time.     Sensory: No sensory deficit.     Motor: No weakness.     Comments: Resting tremors of right hand  Psychiatric:        Mood and Affect: Mood normal.        Behavior: Behavior normal.    Assessment & Plan:   Problem List Items Addressed This Visit       Cardiovascular and Mediastinum   Hypertension    BP Readings from Last 1 Encounters:  10/12/21 132/74  Well-controlled with Amlodipine Was on Losartan, was switched to Amlodipine recently due to AKI Counseled for compliance with the medications Advised DASH diet and moderate exercise/walking as tolerated       Relevant Medications   atorvastatin (LIPITOR) 40 MG tablet   Aortic  atherosclerosis (HCC)    Noted on previous CT chest Advised to start taking aspirin and statin      Relevant Medications   atorvastatin (LIPITOR) 40 MG tablet     Respiratory   Centrilobular emphysema (HCC)    Noted on previous CT chest Currently smokes about 1 pack/day Check CT chest low-dose for lung  cancer screening Asymptomatic currently        Endocrine   Diabetes mellitus ([place]) - Primary    Lab Results  Component Value Date   HGBA1C 6.3 (H) 08/04/2021   On Glimepiride 2 mg BID Was on metformin, which was discontinued recently due to AKI Advised to follow diabetic diet Started statin today F/u BMP and lipid panel Diabetic eye exam: Advised to follow up with Ophthalmology for diabetic eye exam       Relevant Medications   atorvastatin (LIPITOR) 40 MG tablet   Other Relevant Orders   Microalbumin, urine   Basic Metabolic Panel (BMET)     Nervous and Auditory   Peripheral neuropathy    Likely due to DM Takes gabapentin 200 mg TID        Other   Hyperlipidemia    Started statin Check lipid profile      Relevant Medications   atorvastatin (LIPITOR) 40 MG tablet   Tobacco abuse    Smokes about 1 pack/day  Asked about quitting: confirms that he/she currently smokes cigarettes Advise to quit smoking: Educated about QUITTING to reduce the risk of cancer, cardio and cerebrovascular disease. Assess willingness: Unwilling to quit at this time, but is working on cutting back. Assist with counseling and pharmacotherapy: Counseled for 5 minutes and literature provided. Arrange for follow up: follow up in 3 months and continue to offer help.       Relevant Orders   CT CHEST LUNG CA SCREEN LOW DOSE W/O CM   History of CVA (cerebrovascular accident)    History of thalamic stroke in 2020, no residual deficit currently Was on aspirin in the past Advised to start taking aspirin 81 mg daily Started Atorvastatin 40 mg QD Check lipid profile      Relevant Orders    Lipid Profile   Other Visit Diagnoses     Iron deficiency anemia, unspecified iron deficiency anemia type       Relevant Orders   CBC with Differential/Platelet   Encounter for screening for lung cancer       Relevant Orders   CT CHEST LUNG CA SCREEN LOW DOSE W/O CM   Special screening for malignant neoplasms, colon       Relevant Orders   Cologuard       Outpatient Encounter Medications as of 10/12/2021  Medication Sig   acetaminophen (TYLENOL) 325 MG tablet Take 2 tablets (650 mg total) by mouth every 6 (six) hours as needed for mild pain (or Fever >/= 101).   Alcohol Swabs (B-D SINGLE USE SWABS REGULAR) PADS 1 Units by Does not apply route daily at 12 noon.   amLODipine (NORVASC) 10 MG tablet Take 1 tablet (10 mg total) by mouth daily.   atorvastatin (LIPITOR) 40 MG tablet Take 1 tablet (40 mg total) by mouth daily.   Blood Glucose Monitoring Suppl (TRUE METRIX METER) w/Device KIT USE AS DIRECTED   Cholecalciferol (VITAMIN D3) 5000 units CAPS Take 1 capsule by mouth daily.   gabapentin (NEURONTIN) 100 MG capsule Take 2 capsules (200 mg total) by mouth 3 (three) times daily.   glimepiride (AMARYL) 2 MG tablet Take 1 tablet (2 mg total) by mouth 2 (two) times daily before a meal. In Place of Metformin   glucose blood (TRUE METRIX BLOOD GLUCOSE TEST) test strip Use as instructed   TRUEplus Lancets 33G MISC 1 Units by Does not apply route daily at 12 noon.   aspirin EC 81 MG tablet Take 1 tablet (  81 mg total) by mouth daily with breakfast. (Patient not taking: Reported on 10/12/2021)   [DISCONTINUED] blood glucose meter kit and supplies KIT Dispense based on patient and insurance preference. Use up to four times daily as directed. (FOR ICD-9 250.00, 250.01). (Patient not taking: Reported on 10/12/2021)   No facility-administered encounter medications on file as of 10/12/2021.    Follow-up: Return in about 3 months (around 01/10/2022) for DM and HTN.   Lindell Spar, MD

## 2021-10-12 NOTE — Assessment & Plan Note (Signed)
Smokes about 1 pack/day  Asked about quitting: confirms that he/she currently smokes cigarettes Advise to quit smoking: Educated about QUITTING to reduce the risk of cancer, cardio and cerebrovascular disease. Assess willingness: Unwilling to quit at this time, but is working on cutting back. Assist with counseling and pharmacotherapy: Counseled for 5 minutes and literature provided. Arrange for follow up: follow up in 3 months and continue to offer help. 

## 2021-10-12 NOTE — Assessment & Plan Note (Addendum)
History of thalamic stroke in 2020, no residual deficit currently Was on aspirin in the past Advised to start taking aspirin 81 mg daily Started Atorvastatin 40 mg QD Check lipid profile

## 2021-10-12 NOTE — Assessment & Plan Note (Signed)
Lab Results  Component Value Date   HGBA1C 6.3 (H) 08/04/2021    On Glimepiride 2 mg BID Was on metformin, which was discontinued recently due to AKI Advised to follow diabetic diet Started statin today F/u BMP and lipid panel Diabetic eye exam: Advised to follow up with Ophthalmology for diabetic eye exam

## 2021-10-12 NOTE — Assessment & Plan Note (Signed)
Noted on previous CT chest Advised to start taking aspirin and statin

## 2021-10-12 NOTE — Assessment & Plan Note (Signed)
Started statin Check lipid profile

## 2021-10-12 NOTE — Assessment & Plan Note (Signed)
BP Readings from Last 1 Encounters:  10/12/21 132/74   Well-controlled with Amlodipine Was on Losartan, was switched to Amlodipine recently due to AKI Counseled for compliance with the medications Advised DASH diet and moderate exercise/walking as tolerated

## 2021-10-12 NOTE — Patient Instructions (Signed)
Please start taking Atorvastatin as prescribed. Please start taking Aspirin 81 mg once daily.  Please continue taking medications as prescribed.  Please continue to follow low carb diet and ambulate as tolerated.

## 2021-10-12 NOTE — Assessment & Plan Note (Signed)
Noted on previous CT chest Currently smokes about 1 pack/day Check CT chest low-dose for lung cancer screening Asymptomatic currently

## 2021-10-12 NOTE — Assessment & Plan Note (Addendum)
Likely due to DM Takes gabapentin 200 mg TID

## 2021-10-13 ENCOUNTER — Telehealth: Payer: Self-pay | Admitting: Internal Medicine

## 2021-10-13 ENCOUNTER — Other Ambulatory Visit: Payer: Self-pay | Admitting: Internal Medicine

## 2021-10-13 DIAGNOSIS — N184 Chronic kidney disease, stage 4 (severe): Secondary | ICD-10-CM | POA: Insufficient documentation

## 2021-10-13 LAB — BASIC METABOLIC PANEL
BUN/Creatinine Ratio: 16 (ref 10–24)
BUN: 39 mg/dL — ABNORMAL HIGH (ref 8–27)
CO2: 21 mmol/L (ref 20–29)
Calcium: 10 mg/dL (ref 8.6–10.2)
Chloride: 104 mmol/L (ref 96–106)
Creatinine, Ser: 2.41 mg/dL — ABNORMAL HIGH (ref 0.76–1.27)
Glucose: 152 mg/dL — ABNORMAL HIGH (ref 70–99)
Potassium: 4.7 mmol/L (ref 3.5–5.2)
Sodium: 143 mmol/L (ref 134–144)
eGFR: 28 mL/min/{1.73_m2} — ABNORMAL LOW (ref >59)

## 2021-10-13 LAB — CBC WITH DIFFERENTIAL/PLATELET
Basophils Absolute: 0.1 10*3/uL (ref 0.0–0.2)
Basos: 2 %
EOS (ABSOLUTE): 0.4 10*3/uL (ref 0.0–0.4)
Eos: 5 %
Hematocrit: 32 % — ABNORMAL LOW (ref 37.5–51.0)
Hemoglobin: 10.7 g/dL — ABNORMAL LOW (ref 13.0–17.7)
Immature Grans (Abs): 0 10*3/uL (ref 0.0–0.1)
Immature Granulocytes: 0 %
Lymphocytes Absolute: 2.5 10*3/uL (ref 0.7–3.1)
Lymphs: 30 %
MCH: 31.2 pg (ref 26.6–33.0)
MCHC: 33.4 g/dL (ref 31.5–35.7)
MCV: 93 fL (ref 79–97)
Monocytes Absolute: 0.6 10*3/uL (ref 0.1–0.9)
Monocytes: 7 %
Neutrophils Absolute: 4.8 10*3/uL (ref 1.4–7.0)
Neutrophils: 56 %
Platelets: 218 10*3/uL (ref 150–450)
RBC: 3.43 x10E6/uL — ABNORMAL LOW (ref 4.14–5.80)
RDW: 11.8 % (ref 11.6–15.4)
WBC: 8.4 10*3/uL (ref 3.4–10.8)

## 2021-10-13 LAB — LIPID PANEL
Chol/HDL Ratio: 4.5 ratio (ref 0.0–5.0)
Cholesterol, Total: 176 mg/dL (ref 100–199)
HDL: 39 mg/dL — ABNORMAL LOW (ref >39)
LDL Chol Calc (NIH): 120 mg/dL — ABNORMAL HIGH (ref 0–99)
Triglycerides: 91 mg/dL (ref 0–149)
VLDL Cholesterol Cal: 17 mg/dL (ref 5–40)

## 2021-10-13 NOTE — Telephone Encounter (Signed)
Pt called back he sees   Cytogeneticist in [place]  Dr Caprice Kluver

## 2021-10-13 NOTE — Telephone Encounter (Signed)
Noted sent for records

## 2021-10-14 ENCOUNTER — Telehealth: Payer: Self-pay

## 2021-10-14 LAB — MICROALBUMIN, URINE: Microalbumin, Urine: 71.8 ug/mL

## 2021-10-14 NOTE — Telephone Encounter (Signed)
Pt advised of lab results with verbal understanding  

## 2021-10-14 NOTE — Telephone Encounter (Signed)
Patient returning call about labs.

## 2021-10-18 ENCOUNTER — Encounter: Payer: Self-pay | Admitting: *Deleted

## 2021-10-18 DIAGNOSIS — H34831 Tributary (branch) retinal vein occlusion, right eye, with macular edema: Secondary | ICD-10-CM | POA: Diagnosis not present

## 2021-10-21 ENCOUNTER — Telehealth: Payer: Self-pay | Admitting: Internal Medicine

## 2021-10-21 ENCOUNTER — Other Ambulatory Visit: Payer: Self-pay

## 2021-10-21 DIAGNOSIS — E1165 Type 2 diabetes mellitus with hyperglycemia: Secondary | ICD-10-CM

## 2021-10-21 DIAGNOSIS — Z1211 Encounter for screening for malignant neoplasm of colon: Secondary | ICD-10-CM | POA: Diagnosis not present

## 2021-10-21 MED ORDER — TRUE METRIX BLOOD GLUCOSE TEST VI STRP: ORAL_STRIP | 12 refills | Status: DC

## 2021-10-21 MED ORDER — TRUEPLUS LANCETS 33G MISC: 1.0000 [IU] | Freq: Every day | 1 refills | Status: DC

## 2021-10-21 NOTE — Telephone Encounter (Signed)
Pt called in for a return call in regard to diabetic supplies   Pt can be reached on cell

## 2021-10-21 NOTE — Telephone Encounter (Signed)
Refills sent

## 2021-10-26 ENCOUNTER — Other Ambulatory Visit: Payer: Self-pay | Admitting: *Deleted

## 2021-10-26 DIAGNOSIS — E1165 Type 2 diabetes mellitus with hyperglycemia: Secondary | ICD-10-CM

## 2021-10-26 MED ORDER — BD SWAB SINGLE USE REGULAR PADS: 1.0000 [IU] | MEDICATED_PAD | Freq: Every day | 1 refills | Status: AC

## 2021-10-26 MED ORDER — TRUEPLUS LANCETS 33G MISC: 1.0000 [IU] | Freq: Every day | 1 refills | Status: DC

## 2021-10-26 MED ORDER — TRUE METRIX BLOOD GLUCOSE TEST VI STRP: ORAL_STRIP | 12 refills | Status: DC

## 2021-10-28 LAB — COLOGUARD: COLOGUARD: POSITIVE — AB

## 2021-10-31 ENCOUNTER — Other Ambulatory Visit: Payer: Self-pay | Admitting: *Deleted

## 2021-10-31 ENCOUNTER — Other Ambulatory Visit (HOSPITAL_COMMUNITY): Payer: Self-pay | Admitting: Nephrology

## 2021-10-31 ENCOUNTER — Other Ambulatory Visit: Payer: Self-pay | Admitting: Nephrology

## 2021-10-31 DIAGNOSIS — E1129 Type 2 diabetes mellitus with other diabetic kidney complication: Secondary | ICD-10-CM

## 2021-10-31 DIAGNOSIS — R319 Hematuria, unspecified: Secondary | ICD-10-CM | POA: Diagnosis not present

## 2021-10-31 DIAGNOSIS — I129 Hypertensive chronic kidney disease with stage 1 through stage 4 chronic kidney disease, or unspecified chronic kidney disease: Secondary | ICD-10-CM | POA: Diagnosis not present

## 2021-10-31 DIAGNOSIS — E1122 Type 2 diabetes mellitus with diabetic chronic kidney disease: Secondary | ICD-10-CM | POA: Diagnosis not present

## 2021-10-31 DIAGNOSIS — Z716 Tobacco abuse counseling: Secondary | ICD-10-CM | POA: Diagnosis not present

## 2021-10-31 DIAGNOSIS — N189 Chronic kidney disease, unspecified: Secondary | ICD-10-CM | POA: Diagnosis not present

## 2021-10-31 DIAGNOSIS — R809 Proteinuria, unspecified: Secondary | ICD-10-CM

## 2021-10-31 DIAGNOSIS — D638 Anemia in other chronic diseases classified elsewhere: Secondary | ICD-10-CM | POA: Diagnosis not present

## 2021-10-31 DIAGNOSIS — N170 Acute kidney failure with tubular necrosis: Secondary | ICD-10-CM

## 2021-10-31 DIAGNOSIS — R195 Other fecal abnormalities: Secondary | ICD-10-CM

## 2021-11-04 DIAGNOSIS — E1129 Type 2 diabetes mellitus with other diabetic kidney complication: Secondary | ICD-10-CM | POA: Diagnosis not present

## 2021-11-04 DIAGNOSIS — R319 Hematuria, unspecified: Secondary | ICD-10-CM | POA: Diagnosis not present

## 2021-11-04 DIAGNOSIS — N170 Acute kidney failure with tubular necrosis: Secondary | ICD-10-CM | POA: Diagnosis not present

## 2021-11-04 DIAGNOSIS — E1122 Type 2 diabetes mellitus with diabetic chronic kidney disease: Secondary | ICD-10-CM | POA: Diagnosis not present

## 2021-11-04 DIAGNOSIS — D638 Anemia in other chronic diseases classified elsewhere: Secondary | ICD-10-CM | POA: Diagnosis not present

## 2021-11-04 DIAGNOSIS — I129 Hypertensive chronic kidney disease with stage 1 through stage 4 chronic kidney disease, or unspecified chronic kidney disease: Secondary | ICD-10-CM | POA: Diagnosis not present

## 2021-11-04 DIAGNOSIS — Z716 Tobacco abuse counseling: Secondary | ICD-10-CM | POA: Diagnosis not present

## 2021-11-04 DIAGNOSIS — N189 Chronic kidney disease, unspecified: Secondary | ICD-10-CM | POA: Diagnosis not present

## 2021-11-04 DIAGNOSIS — Z79899 Other long term (current) drug therapy: Secondary | ICD-10-CM | POA: Diagnosis not present

## 2021-11-04 DIAGNOSIS — E559 Vitamin D deficiency, unspecified: Secondary | ICD-10-CM | POA: Diagnosis not present

## 2021-11-04 DIAGNOSIS — R809 Proteinuria, unspecified: Secondary | ICD-10-CM | POA: Diagnosis not present

## 2021-11-15 DIAGNOSIS — H34831 Tributary (branch) retinal vein occlusion, right eye, with macular edema: Secondary | ICD-10-CM | POA: Diagnosis not present

## 2021-11-15 DIAGNOSIS — E113393 Type 2 diabetes mellitus with moderate nonproliferative diabetic retinopathy without macular edema, bilateral: Secondary | ICD-10-CM | POA: Diagnosis not present

## 2021-11-22 ENCOUNTER — Other Ambulatory Visit: Payer: Self-pay

## 2021-11-22 ENCOUNTER — Ambulatory Visit (HOSPITAL_COMMUNITY)
Admission: RE | Admit: 2021-11-22 | Discharge: 2021-11-22 | Disposition: A | Discharge status: Home / Self Care | Payer: Medicare HMO | Source: Ambulatory Visit | Attending: Internal Medicine | Admitting: Internal Medicine

## 2021-11-22 ENCOUNTER — Ambulatory Visit (HOSPITAL_COMMUNITY)
Admission: RE | Admit: 2021-11-22 | Discharge: 2021-11-22 | Disposition: A | Discharge status: Home / Self Care | Payer: Medicare HMO | Source: Ambulatory Visit | Attending: Nephrology | Admitting: Nephrology

## 2021-11-22 DIAGNOSIS — I2510 Atherosclerotic heart disease of native coronary artery without angina pectoris: Secondary | ICD-10-CM | POA: Insufficient documentation

## 2021-11-22 DIAGNOSIS — E1122 Type 2 diabetes mellitus with diabetic chronic kidney disease: Secondary | ICD-10-CM | POA: Insufficient documentation

## 2021-11-22 DIAGNOSIS — Z87891 Personal history of nicotine dependence: Secondary | ICD-10-CM | POA: Insufficient documentation

## 2021-11-22 DIAGNOSIS — I129 Hypertensive chronic kidney disease with stage 1 through stage 4 chronic kidney disease, or unspecified chronic kidney disease: Secondary | ICD-10-CM | POA: Insufficient documentation

## 2021-11-22 DIAGNOSIS — E1129 Type 2 diabetes mellitus with other diabetic kidney complication: Secondary | ICD-10-CM | POA: Diagnosis not present

## 2021-11-22 DIAGNOSIS — Z122 Encounter for screening for malignant neoplasm of respiratory organs: Secondary | ICD-10-CM | POA: Insufficient documentation

## 2021-11-22 DIAGNOSIS — D638 Anemia in other chronic diseases classified elsewhere: Secondary | ICD-10-CM | POA: Diagnosis not present

## 2021-11-22 DIAGNOSIS — N170 Acute kidney failure with tubular necrosis: Secondary | ICD-10-CM | POA: Insufficient documentation

## 2021-11-22 DIAGNOSIS — N189 Chronic kidney disease, unspecified: Secondary | ICD-10-CM | POA: Diagnosis not present

## 2021-11-22 DIAGNOSIS — J439 Emphysema, unspecified: Secondary | ICD-10-CM | POA: Insufficient documentation

## 2021-11-22 DIAGNOSIS — R809 Proteinuria, unspecified: Secondary | ICD-10-CM | POA: Insufficient documentation

## 2021-11-22 DIAGNOSIS — Z720 Tobacco use: Secondary | ICD-10-CM

## 2021-11-22 DIAGNOSIS — I700 Atherosclerosis of aorta: Secondary | ICD-10-CM | POA: Insufficient documentation

## 2021-11-22 DIAGNOSIS — N179 Acute kidney failure, unspecified: Secondary | ICD-10-CM | POA: Diagnosis not present

## 2022-01-10 ENCOUNTER — Ambulatory Visit (INDEPENDENT_AMBULATORY_CARE_PROVIDER_SITE_OTHER): Payer: Medicare HMO | Admitting: Internal Medicine

## 2022-01-10 ENCOUNTER — Encounter: Payer: Self-pay | Admitting: Internal Medicine

## 2022-01-10 VITALS — BP 132/72 | HR 83 | Resp 16 | Ht 67.0 in | Wt 193.0 lb

## 2022-01-10 DIAGNOSIS — Z720 Tobacco use: Secondary | ICD-10-CM

## 2022-01-10 DIAGNOSIS — F17210 Nicotine dependence, cigarettes, uncomplicated: Secondary | ICD-10-CM | POA: Diagnosis not present

## 2022-01-10 DIAGNOSIS — G63 Polyneuropathy in diseases classified elsewhere: Secondary | ICD-10-CM

## 2022-01-10 DIAGNOSIS — E1165 Type 2 diabetes mellitus with hyperglycemia: Secondary | ICD-10-CM | POA: Diagnosis not present

## 2022-01-10 DIAGNOSIS — I10 Essential (primary) hypertension: Secondary | ICD-10-CM

## 2022-01-10 DIAGNOSIS — N184 Chronic kidney disease, stage 4 (severe): Secondary | ICD-10-CM

## 2022-01-10 DIAGNOSIS — I700 Atherosclerosis of aorta: Secondary | ICD-10-CM | POA: Diagnosis not present

## 2022-01-10 DIAGNOSIS — E1169 Type 2 diabetes mellitus with other specified complication: Secondary | ICD-10-CM | POA: Diagnosis not present

## 2022-01-10 MED ORDER — GABAPENTIN 100 MG PO CAPS: 200.0000 mg | ORAL_CAPSULE | Freq: Three times a day (TID) | ORAL | 2 refills | Status: DC

## 2022-01-10 NOTE — Assessment & Plan Note (Signed)
Smokes about 0.5 pack/day  Asked about quitting: confirms that he/she currently smokes cigarettes Advise to quit smoking: Educated about QUITTING to reduce the risk of cancer, cardio and cerebrovascular disease. Assess willingness: Unwilling to quit at this time, but is working on cutting back. Assist with counseling and pharmacotherapy: Counseled for 5 minutes and literature provided. Arrange for follow up: follow up in 3 months and continue to offer help. 

## 2022-01-10 NOTE — Patient Instructions (Signed)
Please continue to take medications as prescribed. ? ?Please continue to follow low carb diet and ambulate as tolerated. ? ?Please consider getting Shingrix vaccine at your local pharmacy. ?

## 2022-01-10 NOTE — Progress Notes (Signed)
? ?Established Patient Office Visit ? ?Subjective:  ?Patient ID: BINGHAM MILLETTE, male    DOB: [birth year]  Age: 71 y.o. MRN: [id number] ? ?CC:  ?Chief Complaint  ?Patient presents with  ? Follow-up  ?  3 month follow up HTN and DM pt has been off balance from neuropathy   ? ? ?HPI ?KEN BONN is a 71 y.o. male with past medical history of CVA, HTN, COPD, type II DM with neuropathy and tobacco abuse who presents for f/u of his chronic medical conditions. ? ?HTN: BP is well-controlled. Takes medications regularly. Patient denies headache, dizziness, chest pain, dyspnea or palpitations. ? ?Type 2 DM: His last HbA1C was 6.3. He is on Glimepiride currently. He also takes Gabapentin for DM neuropathy. He states that he has balance problem due to neuropathy, but does not want to adjust Gabapentin for now. ? ?He has not been able to see Nephrologist since 01/23. His last BMP showed GFR of 18. Denies any dysuria, hematuria or urinary hesitancy/resistance. ? ? ? ? ?Past Medical History:  ?Diagnosis Date  ? Borderline diabetes   ? Peripheral neuropathy 07/15/2019  ? Right lower lobe lung mass 04/11/2017  ? Thalamic stroke ([place]) 07/15/2019  ? Type II diabetes mellitus, uncontrolled 07/15/2019  ? Vitamin D deficiency disease 07/15/2019  ? ? ?Past Surgical History:  ?Procedure Laterality Date  ? APPENDECTOMY    ? CATARACT EXTRACTION, BILATERAL Bilateral 01/2021  ? ? ?Family History  ?Problem Relation Age of Onset  ? Diabetes Mother 27  ? Stroke Mother   ? Colon cancer Neg Hx   ? ? ?Social History  ? ?Socioeconomic History  ? Marital status: Single  ?  Spouse name: Not on file  ? Number of children: Not on file  ? Years of education: Not on file  ? Highest education level: Not on file  ?Occupational History  ? Not on file  ?Tobacco Use  ? Smoking status: Every Day  ?  Packs/day: 1.00  ?  Years: 45.00  ?  Pack years: 45.00  ?  Types: Cigarettes  ? Smokeless tobacco: Never  ?Vaping Use  ? Vaping Use: Never used  ?Substance  and Sexual Activity  ? Alcohol use: Yes  ?  Alcohol/week: 1.0 standard drink  ?  Types: 1 Cans of beer per week  ?  Comment: every 3 days  ? Drug use: No  ? Sexual activity: Not on file  ?Other Topics Concern  ? Not on file  ?Social History Narrative  ? Single.Lives alone in a motel.Works at [employer].  ? ?Social Determinants of Health  ? ?Financial Resource Strain: Not on file  ?Food Insecurity: Not on file  ?Transportation Needs: Not on file  ?Physical Activity: Not on file  ?Stress: Not on file  ?Social Connections: Not on file  ?Intimate Partner Violence: Not on file  ? ? ?Outpatient Medications Prior to Visit  ?Medication Sig Dispense Refill  ? acetaminophen (TYLENOL) 325 MG tablet Take 2 tablets (650 mg total) by mouth every 6 (six) hours as needed for mild pain (or Fever >/= 101). 12 tablet 0  ? Alcohol Swabs (B-D SINGLE USE SWABS REGULAR) PADS 1 Units by Does not apply route daily at 12 noon. 100 each 1  ? amLODipine (NORVASC) 10 MG tablet Take 1 tablet (10 mg total) by mouth daily. 30 tablet 11  ? aspirin EC 81 MG tablet Take 1 tablet (81 mg total) by mouth daily with breakfast. 30 tablet 2  ?  atorvastatin (LIPITOR) 40 MG tablet Take 1 tablet (40 mg total) by mouth daily. 90 tablet 3  ? Blood Glucose Monitoring Suppl (TRUE METRIX METER) w/Device KIT USE AS DIRECTED 1 kit 0  ? Cholecalciferol (VITAMIN D3) 5000 units CAPS Take 1 capsule by mouth daily.    ? gabapentin (NEURONTIN) 100 MG capsule Take 2 capsules (200 mg total) by mouth 3 (three) times daily. 360 capsule 2  ? glimepiride (AMARYL) 2 MG tablet Take 1 tablet (2 mg total) by mouth 2 (two) times daily before a meal. In Place of Metformin 60 tablet 11  ? glucose blood (TRUE METRIX BLOOD GLUCOSE TEST) test strip Use as instructed 100 each 12  ? TRUEplus Lancets 33G MISC 1 Units by Does not apply route daily at 12 noon. 100 each 1  ? ?No facility-administered medications prior to visit.  ? ? ?No Known Allergies ? ?ROS ?Review of Systems   ?Constitutional:  Negative for chills and fever.  ?HENT:  Negative for congestion and sore throat.   ?Eyes:  Negative for pain and discharge.  ?Respiratory:  Negative for cough and shortness of breath.   ?Cardiovascular:  Negative for chest pain and palpitations.  ?Gastrointestinal:  Negative for diarrhea, nausea and vomiting.  ?Endocrine: Negative for polydipsia and polyuria.  ?Genitourinary:  Negative for dysuria and hematuria.  ?Musculoskeletal:  Negative for neck pain and neck stiffness.  ?Skin:  Negative for rash.  ?Neurological:  Positive for tremors. Negative for dizziness, weakness, numbness and headaches.  ?Psychiatric/Behavioral:  Negative for agitation and behavioral problems.   ? ?  ?Objective:  ?  ?Physical Exam ?Vitals reviewed.  ?Constitutional:   ?   General: He is not in acute distress. ?   Appearance: He is not diaphoretic.  ?HENT:  ?   Head: Normocephalic and atraumatic.  ?   Nose: Nose normal.  ?   Mouth/Throat:  ?   Mouth: Mucous membranes are moist.  ?Eyes:  ?   General: No scleral icterus. ?   Extraocular Movements: Extraocular movements intact.  ?Cardiovascular:  ?   Rate and Rhythm: Normal rate and regular rhythm.  ?   Pulses: Normal pulses.  ?   Heart sounds: Normal heart sounds. No murmur heard. ?Pulmonary:  ?   Breath sounds: Normal breath sounds. No wheezing or rales.  ?Musculoskeletal:  ?   Cervical back: Neck supple. No tenderness.  ?   Right lower leg: No edema.  ?   Left lower leg: No edema.  ?Skin: ?   General: Skin is warm.  ?   Findings: No rash.  ?Neurological:  ?   General: No focal deficit present.  ?   Mental Status: He is alert and oriented to person, place, and time.  ?   Sensory: No sensory deficit.  ?   Motor: No weakness.  ?   Comments: Resting tremors of right hand  ?Psychiatric:     ?   Mood and Affect: Mood normal.     ?   Behavior: Behavior normal.  ? ? ?BP 132/72 (BP Location: Right Arm, Patient Position: Sitting, Cuff Size: Normal)   Pulse 83   Resp 16   Ht 5'  7" (1.702 m)   Wt 193 lb (87.5 kg)   SpO2 96%   BMI 30.23 kg/m?  ?Wt Readings from Last 3 Encounters:  ?01/10/22 193 lb (87.5 kg)  ?10/12/21 187 lb 1.3 oz (84.9 kg)  ?08/04/21 174 lb 9.7 oz (79.2 kg)  ? ? ?Lab Results  ?  Component Value Date  ? TSH 3.54 02/23/2020  ? ?Lab Results  ?Component Value Date  ? WBC 8.4 10/12/2021  ? HGB 10.7 (L) 10/12/2021  ? HCT 32.0 (L) 10/12/2021  ? MCV 93 10/12/2021  ? PLT 218 10/12/2021  ? ?Lab Results  ?Component Value Date  ? NA 143 10/12/2021  ? K 4.7 10/12/2021  ? CO2 21 10/12/2021  ? GLUCOSE 152 (H) 10/12/2021  ? BUN 39 (H) 10/12/2021  ? CREATININE 2.41 (H) 10/12/2021  ? BILITOT 0.3 08/04/2021  ? ALKPHOS 62 08/04/2021  ? AST 14 (L) 08/04/2021  ? ALT 12 08/04/2021  ? PROT 7.9 08/04/2021  ? ALBUMIN 4.1 08/04/2021  ? CALCIUM 10.0 10/12/2021  ? ANIONGAP 9 08/07/2021  ? EGFR 28 (L) 10/12/2021  ? ?Lab Results  ?Component Value Date  ? CHOL 176 10/12/2021  ? ?Lab Results  ?Component Value Date  ? HDL 39 (L) 10/12/2021  ? ?Lab Results  ?Component Value Date  ? LDLCALC 120 (H) 10/12/2021  ? ?Lab Results  ?Component Value Date  ? TRIG 91 10/12/2021  ? ?Lab Results  ?Component Value Date  ? CHOLHDL 4.5 10/12/2021  ? ?Lab Results  ?Component Value Date  ? HGBA1C 6.3 (H) 08/04/2021  ? ? ?  ?Assessment & Plan:  ? ?Problem List Items Addressed This Visit   ? ?  ? Cardiovascular and Mediastinum  ? Hypertension  ?  BP Readings from Last 1 Encounters:  ?01/10/22 132/72  ?Well-controlled with Amlodipine ?Was on Losartan, was switched to Amlodipine recently due to AKI ?Counseled for compliance with the medications ?Advised DASH diet and moderate exercise/walking as tolerated ? ?  ?  ? Aortic atherosclerosis ([place])  ?  Noted on previous CT chest ?Advised to start taking aspirin and statin ?  ?  ?  ? Endocrine  ? Diabetes mellitus ([place]) - Primary  ?  Lab Results  ?Component Value Date  ? HGBA1C 6.3 (H) 08/04/2021  ? ?On Glimepiride 2 mg BID ?Was on metformin, which was discontinued recently due to  AKI ?Advised to follow diabetic diet ?On statin  ?F/u BMP and HbA1C ?Diabetic foot exam: Today ?Diabetic eye exam: Advised to follow up with Ophthalmology for diabetic eye exam ?  ?  ? Relevant Orders  ? Microalbumin /

## 2022-01-10 NOTE — Assessment & Plan Note (Signed)
Noted on previous CT chest ?Advised to start taking aspirin and statin ?

## 2022-01-10 NOTE — Assessment & Plan Note (Signed)
Last BMP reviewed ?Had AKI on CKD ?Check BMP today ?Followed by Nephrology ?Avoid nephrotoxic agents ?

## 2022-01-10 NOTE — Assessment & Plan Note (Signed)
Lab Results  ?Component Value Date  ? HGBA1C 6.3 (H) 08/04/2021  ? ? ?On Glimepiride 2 mg BID ?Was on metformin, which was discontinued recently due to AKI ?Advised to follow diabetic diet ?On statin  ?F/u BMP and HbA1C ?Diabetic foot exam: Today ?Diabetic eye exam: Advised to follow up with Ophthalmology for diabetic eye exam ?

## 2022-01-10 NOTE — Assessment & Plan Note (Signed)
BP Readings from Last 1 Encounters:  ?01/10/22 132/72  ? ?Well-controlled with Amlodipine ?Was on Losartan, was switched to Amlodipine recently due to AKI ?Counseled for compliance with the medications ?Advised DASH diet and moderate exercise/walking as tolerated ? ?

## 2022-01-11 DIAGNOSIS — E1169 Type 2 diabetes mellitus with other specified complication: Secondary | ICD-10-CM | POA: Diagnosis not present

## 2022-01-11 LAB — HEMOGLOBIN A1C
Est. average glucose Bld gHb Est-mCnc: 154 mg/dL
Hgb A1c MFr Bld: 7 % — ABNORMAL HIGH (ref 4.8–5.6)

## 2022-01-11 LAB — BASIC METABOLIC PANEL
BUN/Creatinine Ratio: 15 (ref 10–24)
BUN: 34 mg/dL — ABNORMAL HIGH (ref 8–27)
CO2: 24 mmol/L (ref 20–29)
Calcium: 9.4 mg/dL (ref 8.6–10.2)
Chloride: 107 mmol/L — ABNORMAL HIGH (ref 96–106)
Creatinine, Ser: 2.3 mg/dL — ABNORMAL HIGH (ref 0.76–1.27)
Glucose: 164 mg/dL — ABNORMAL HIGH (ref 70–99)
Potassium: 4.7 mmol/L (ref 3.5–5.2)
Sodium: 143 mmol/L (ref 134–144)
eGFR: 29 mL/min/{1.73_m2} — ABNORMAL LOW (ref >59)

## 2022-01-15 LAB — MICROALBUMIN / CREATININE URINE RATIO
Creatinine, Urine: 79.9 mg/dL
Microalb/Creat Ratio: 18 mg/g creat (ref 0–29)
Microalbumin, Urine: 14.4 ug/mL

## 2022-03-15 ENCOUNTER — Ambulatory Visit (INDEPENDENT_AMBULATORY_CARE_PROVIDER_SITE_OTHER): Payer: Medicare HMO | Admitting: *Deleted

## 2022-03-15 DIAGNOSIS — Z0000 Encounter for general adult medical examination without abnormal findings: Secondary | ICD-10-CM | POA: Diagnosis not present

## 2022-03-15 NOTE — Progress Notes (Signed)
Subjective:   Collin Yoder is a 71 y.o. male who presents for Medicare Annual/Subsequent preventive examination.  I connected with  Jamison Oka on 03/15/22 by a audio enabled telemedicine application and verified that I am speaking with the correct person using two identifiers.  Patient Location: Home  Provider Location: Office/Clinic  I discussed the limitations of evaluation and management by telemedicine. The patient expressed understanding and agreed to proceed.   Review of Systems     Collin Yoder , Thank you for taking time to come for your Medicare Wellness Visit. I appreciate your ongoing commitment to your health goals. Please review the following plan we discussed and let me know if I can assist you in the future.   These are the goals we discussed:  Goals   None     This is a list of the screening recommended for you and due dates:  Health Maintenance  Topic Date Due   COVID-19 Vaccine (1) Never done   Zoster (Shingles) Vaccine (1 of 2) Never done   Flu Shot  05/02/2022   Hemoglobin A1C  07/12/2022   Eye exam for diabetics  10/04/2022   Complete foot exam   01/11/2023   Urine Protein Check  01/12/2023   Tetanus Vaccine  10/03/2023   Cologuard (Stool DNA test)  10/21/2024   Pneumonia Vaccine  Completed   Hepatitis C Screening: USPSTF Recommendation to screen - Ages 21-79 yo.  Completed   HPV Vaccine  Aged Out          Objective:    There were no vitals filed for this visit. There is no height or weight on file to calculate BMI.     08/04/2021    1:44 PM 08/03/2021    7:19 AM 08/02/2021    5:42 PM 01/06/2021    2:55 PM 01/27/2019    1:13 PM 04/11/2017   11:16 AM 03/29/2017   11:48 AM  Advanced Directives  Does Patient Have a Medical Advance Directive? _0  No No  Would patient like information on creating a medical advance directive? No - Patient declined  No - Patient declined No - Patient declined No - Patient declined       Current Medications (verified) Outpatient Encounter Medications as of 03/15/2022  Medication Sig   acetaminophen (TYLENOL) 325 MG tablet Take 2 tablets (650 mg total) by mouth every 6 (six) hours as needed for mild pain (or Fever >/= 101).   Alcohol Swabs (B-D SINGLE USE SWABS REGULAR) PADS 1 Units by Does not apply route daily at 12 noon.   amLODipine (NORVASC) 10 MG tablet Take 1 tablet (10 mg total) by mouth daily.   aspirin EC 81 MG tablet Take 1 tablet (81 mg total) by mouth daily with breakfast.   atorvastatin (LIPITOR) 40 MG tablet Take 1 tablet (40 mg total) by mouth daily.   Blood Glucose Monitoring Suppl (TRUE METRIX METER) w/Device KIT USE AS DIRECTED   Cholecalciferol (VITAMIN D3) 5000 units CAPS Take 1 capsule by mouth daily.   gabapentin (NEURONTIN) 100 MG capsule Take 2 capsules (200 mg total) by mouth 3 (three) times daily.   glimepiride (AMARYL) 2 MG tablet Take 1 tablet (2 mg total) by mouth 2 (two) times daily before a meal. In Place of Metformin   glucose blood (TRUE METRIX BLOOD GLUCOSE TEST) test strip Use as instructed   TRUEplus Lancets 33G MISC 1 Units by Does not apply route daily at 12  noon.   No facility-administered encounter medications on file as of 03/15/2022.    Allergies (verified) Patient has no known allergies.   History: Past Medical History:  Diagnosis Date   Borderline diabetes    Peripheral neuropathy 07/15/2019   Right lower lobe lung mass 04/11/2017   Thalamic stroke ([place]) 07/15/2019   Type II diabetes mellitus, uncontrolled 07/15/2019   Vitamin D deficiency disease 07/15/2019   Past Surgical History:  Procedure Laterality Date   APPENDECTOMY     CATARACT EXTRACTION, BILATERAL Bilateral 01/2021   Family History  Problem Relation Age of Onset   Diabetes Mother 49   Stroke Mother    Colon cancer Neg Hx    Social History   Socioeconomic History   Marital status: Single    Spouse name: Not on file   Number of children: Not on  file   Years of education: Not on file   Highest education level: Not on file  Occupational History   Not on file  Tobacco Use   Smoking status: Every Day    Packs/day: 1.00    Years: 45.00    Total pack years: 45.00    Types: Cigarettes   Smokeless tobacco: Never  Vaping Use   Vaping Use: Never used  Substance and Sexual Activity   Alcohol use: Yes    Alcohol/week: 1.0 standard drink of alcohol    Types: 1 Cans of beer per week    Comment: every 3 days   Drug use: No   Sexual activity: Not on file  Other Topics Concern   Not on file  Social History Narrative   Single.Lives alone in a motel.Works at [employer].   Social Determinants of Health   Financial Resource Strain: Not on file  Food Insecurity: Not on file  Transportation Needs: Not on file  Physical Activity: Not on file  Stress: Not on file  Social Connections: Not on file    Tobacco Counseling Ready to quit: Not Answered Counseling given: Not Answered   Clinical Intake:                 Diabetic?Nutrition Risk Assessment:  Has the patient had any N/V/D within the last 2 months?  No  Does the patient have any non-healing wounds?  No  Has the patient had any unintentional weight loss or weight gain?  No   Diabetes:  Is the patient diabetic?  Yes  If diabetic, was a CBG obtained today?  Yes  Did the patient bring in their glucometer from home?  Yes  How often do you monitor your CBG's? Three times a day.   Financial Strains and Diabetes Management:  Are you having any financial strains with the device, your supplies or your medication? No .  Does the patient want to be seen by Chronic Care Management for management of their diabetes?  No  Would the patient like to be referred to a Nutritionist or for Diabetic Management?  No   Diabetic Exams:  Diabetic Eye Exam: Completed 10-04-21.   Diabetic Foot Exam: Completed 01-10-22.          Activities of Daily Living    08/04/2021     5:00 PM  In your present state of health, do you have any difficulty performing the following activities:  Hearing? 0  Vision? 0  Difficulty concentrating or making decisions? 0  Walking or climbing stairs? 0  Dressing or bathing? 0  Doing errands, shopping? 0    Patient Care  Team: Lindell Spar, MD as PCP - General (Internal Medicine) Gala Romney Cristopher Estimable, MD as Consulting Physician (Gastroenterology) [facility], [place] any recent Medical Services you may have received from other than [facility] providers in the past year (date may be approximate).     Assessment:   This is a routine wellness examination for Collin Yoder.  Hearing/Vision screen No results found.  Dietary issues and exercise activities discussed:     Goals Addressed   None   Depression Screen    01/10/2022    2:53 PM 10/12/2021    1:32 PM 03/01/2021    9:07 AM 01/06/2021    2:37 PM 02/23/2020    2:22 PM  PHQ 2/9 Scores  PHQ - 2 Score 0 0 0 0 0  PHQ- 9 Score   0 0   Exception Documentation     Medical reason    Fall Risk    01/10/2022    2:53 PM 10/12/2021    1:32 PM 03/01/2021    9:08 AM 01/06/2021    2:37 PM 02/23/2020    2:22 PM  Fall Risk   Falls in the past year? 0 0 0 0 0  Number falls in past yr: 0 0   0  Injury with Fall? 0 0   0  Risk for fall due to : No Fall Risks No Fall Risks   No Fall Risks  Follow up Falls evaluation completed Falls evaluation completed   Falls evaluation completed    [place]:  Any stairs in or around the home? Yes  If so, are there any without handrails? No  Home free of loose throw rugs in walkways, pet beds, electrical cords, etc? Yes  Adequate lighting in your home to reduce risk of falls? Yes   ASSISTIVE DEVICES UTILIZED TO PREVENT FALLS:  Life alert? No  Use of a cane, walker or w/c? No  Grab bars in the bathroom? Yes  Shower chair or bench in shower? Yes  Elevated toilet seat or a handicapped toilet? Yes    Cognitive  Function:        01/06/2021    2:56 PM  6CIT Screen  What Year? 0 points  What month? 0 points  What time? 0 points  Count back from 20 0 points  Months in reverse 0 points  Repeat phrase 4 points  Total Score 4 points    Immunizations Immunization History  Administered Date(s) Administered   Fluad Quad(high Dose 65+) 07/15/2019, 08/03/2020, 09/01/2021   Pneumococcal Conjugate-13 07/26/2017   Pneumococcal Polysaccharide-23 07/15/2019   Td 10/02/2013    TDAP status: Up to date  Flu Vaccine status: Up to date  Pneumococcal vaccine status: Up to date  Covid-19 vaccine status: Declined, Education has been provided regarding the importance of this vaccine but patient still declined. Advised may receive this vaccine at local pharmacy or Health Dept.or vaccine clinic. Aware to provide a copy of the vaccination record if obtained from local pharmacy or Health Dept. Verbalized acceptance and understanding.  Qualifies for Shingles Vaccine? Yes   Zostavax completed No   Shingrix Completed?: No.    Education has been provided regarding the importance of this vaccine. Patient has been advised to call insurance company to determine out of pocket expense if they have not yet received this vaccine. Advised may also receive vaccine at local pharmacy or Health Dept. Verbalized acceptance and understanding.  Screening Tests Health Maintenance  Topic Date Due  COVID-19 Vaccine (1) Never done   Zoster Vaccines- Shingrix (1 of 2) Never done   INFLUENZA VACCINE  05/02/2022   HEMOGLOBIN A1C  07/12/2022   OPHTHALMOLOGY EXAM  10/04/2022   FOOT EXAM  01/11/2023   URINE MICROALBUMIN  01/12/2023   TETANUS/TDAP  10/03/2023   Fecal DNA (Cologuard)  10/21/2024   Pneumonia Vaccine 75+ Years old  Completed   Hepatitis C Screening  Completed   HPV VACCINES  Aged Out    Health Maintenance  Health Maintenance Due  Topic Date Due   COVID-19 Vaccine (1) Never done   Zoster Vaccines- Shingrix (1 of  2) Never done    Colorectal cancer screening: Type of screening: FOBT/FIT. Completed 10-21-21. Repeat every 3 years  Lung Cancer Screening: (Low Dose CT Chest recommended if Age 34-80 years, 30 pack-year currently smoking OR have quit w/in 15years.) does qualify.   Lung Cancer Screening Referral: no already completed 11-22-21  Additional Screening:  Hepatitis C Screening: does qualify; Completed 07-15-19  Vision Screening: Recommended annual ophthalmology exams for early detection of glaucoma and other disorders of the eye. Is the patient up to date with their annual eye exam?  Yes  Who is the provider or what is the name of the office in which the patient attends annual eye exams? Cant remember name If pt is not established with a provider, would they like to be referred to a provider to establish care? No .   Dental Screening: Recommended annual dental exams for proper oral hygiene  Community Resource Referral / Chronic Care Management: CRR required this visit?  No   CCM required this visit?  No      Plan:     I have personally reviewed and noted the following in the patient's chart:   Medical and social history Use of alcohol, tobacco or illicit drugs  Current medications and supplements including opioid prescriptions. Patient is not currently taking opioid prescriptions. Functional ability and status Nutritional status Physical activity Advanced directives List of other physicians Hospitalizations, surgeries, and ER visits in previous 12 months Vitals Screenings to include cognitive, depression, and falls Referrals and appointments  In addition, I have reviewed and discussed with patient certain preventive protocols, quality metrics, and best practice recommendations. A written personalized care plan for preventive services as well as general preventive health recommendations were provided to patient.     Shelda Altes, CMA   03/15/2022   Nurse Notes:  Mr.  Yoder , Thank you for taking time to come for your Medicare Wellness Visit. I appreciate your ongoing commitment to your health goals. Please review the following plan we discussed and let me know if I can assist you in the future.   These are the goals we discussed:  Goals   None     This is a list of the screening recommended for you and due dates:  Health Maintenance  Topic Date Due   COVID-19 Vaccine (1) Never done   Zoster (Shingles) Vaccine (1 of 2) Never done   Flu Shot  05/02/2022   Hemoglobin A1C  07/12/2022   Eye exam for diabetics  10/04/2022   Complete foot exam   01/11/2023   Urine Protein Check  01/12/2023   Tetanus Vaccine  10/03/2023   Cologuard (Stool DNA test)  10/21/2024   Pneumonia Vaccine  Completed   Hepatitis C Screening: USPSTF Recommendation to screen - Ages 35-79 yo.  Completed   HPV Vaccine  Aged Out

## 2022-03-15 NOTE — Patient Instructions (Signed)
Mr. Collin Yoder , Thank you for taking time to come for your Medicare Wellness Visit. I appreciate your ongoing commitment to your health goals. Please review the following plan we discussed and let me know if I can assist you in the future.   Screening recommendations/referrals: Colonoscopy: completed cologuard Recommended yearly ophthalmology/optometry visit for glaucoma screening and checkup Recommended yearly dental visit for hygiene and checkup  Vaccinations: Influenza vaccine: completed Pneumococcal vaccine: completed Tdap vaccine: completed Shingles vaccine: due now    Advanced directives: declined information  Conditions/risks identified: hypertension, diabetes  Next appointment: 1 year   Preventive Care 23 Years and Older, Male Preventive care refers to lifestyle choices and visits with your health care provider that can promote health and wellness. What does preventive care include? A yearly physical exam. This is also called an annual well check. Dental exams once or twice a year. Routine eye exams. Ask your health care provider how often you should have your eyes checked. Personal lifestyle choices, including: Daily care of your teeth and gums. Regular physical activity. Eating a healthy diet. Avoiding tobacco and drug use. Limiting alcohol use. Practicing safe sex. Taking low doses of aspirin every day. Taking vitamin and mineral supplements as recommended by your health care provider. What happens during an annual well check? The services and screenings done by your health care provider during your annual well check will depend on your age, overall health, lifestyle risk factors, and family history of disease. Counseling  Your health care provider may ask you questions about your: Alcohol use. Tobacco use. Drug use. Emotional well-being. Home and relationship well-being. Sexual activity. Eating habits. History of falls. Memory and ability to understand  (cognition). Work and work Statistician. Screening  You may have the following tests or measurements: Height, weight, and BMI. Blood pressure. Lipid and cholesterol levels. These may be checked every 5 years, or more frequently if you are over 31 years old. Skin check. Lung cancer screening. You may have this screening every year starting at age 27 if you have a 30-pack-year history of smoking and currently smoke or have quit within the past 15 years. Fecal occult blood test (FOBT) of the stool. You may have this test every year starting at age 29. Flexible sigmoidoscopy or colonoscopy. You may have a sigmoidoscopy every 5 years or a colonoscopy every 10 years starting at age 64. Prostate cancer screening. Recommendations will vary depending on your family history and other risks. Hepatitis C blood test. Hepatitis B blood test. Sexually transmitted disease (STD) testing. Diabetes screening. This is done by checking your blood sugar (glucose) after you have not eaten for a while (fasting). You may have this done every 1-3 years. Abdominal aortic aneurysm (AAA) screening. You may need this if you are a current or former smoker. Osteoporosis. You may be screened starting at age 97 if you are at high risk. Talk with your health care provider about your test results, treatment options, and if necessary, the need for more tests. Vaccines  Your health care provider may recommend certain vaccines, such as: Influenza vaccine. This is recommended every year. Tetanus, diphtheria, and acellular pertussis (Tdap, Td) vaccine. You may need a Td booster every 10 years. Zoster vaccine. You may need this after age 92. Pneumococcal 13-valent conjugate (PCV13) vaccine. One dose is recommended after age 41. Pneumococcal polysaccharide (PPSV23) vaccine. One dose is recommended after age 24. Talk to your health care provider about which screenings and vaccines you need and how often you need  them. This  information is not intended to replace advice given to you by your health care provider. Make sure you discuss any questions you have with your health care provider. Document Released: 10/15/2015 Document Revised: 06/07/2016 Document Reviewed: 07/20/2015 Elsevier Interactive Patient Education  2017 [place] Prevention in the Home Falls can cause injuries. They can happen to people of all ages. There are many things you can do to make your home safe and to help prevent falls. What can I do on the outside of my home? Regularly fix the edges of walkways and driveways and fix any cracks. Remove anything that might make you trip as you walk through a door, such as a raised step or threshold. Trim any bushes or trees on the path to your home. Use bright outdoor lighting. Clear any walking paths of anything that might make someone trip, such as rocks or tools. Regularly check to see if handrails are loose or broken. Make sure that both sides of any steps have handrails. Any raised decks and porches should have guardrails on the edges. Have any leaves, snow, or ice cleared regularly. Use sand or salt on walking paths during winter. Clean up any spills in your garage right away. This includes oil or grease spills. What can I do in the bathroom? Use night lights. Install grab bars by the toilet and in the tub and shower. Do not use towel bars as grab bars. Use non-skid mats or decals in the tub or shower. If you need to sit down in the shower, use a plastic, non-slip stool. Keep the floor dry. Clean up any water that spills on the floor as soon as it happens. Remove soap buildup in the tub or shower regularly. Attach bath mats securely with double-sided non-slip rug tape. Do not have throw rugs and other things on the floor that can make you trip. What can I do in the bedroom? Use night lights. Make sure that you have a light by your bed that is easy to reach. Do not use any sheets or  blankets that are too big for your bed. They should not hang down onto the floor. Have a firm chair that has side arms. You can use this for support while you get dressed. Do not have throw rugs and other things on the floor that can make you trip. What can I do in the kitchen? Clean up any spills right away. Avoid walking on wet floors. Keep items that you use a lot in easy-to-reach places. If you need to reach something above you, use a strong step stool that has a grab bar. Keep electrical cords out of the way. Do not use floor polish or wax that makes floors slippery. If you must use wax, use non-skid floor wax. Do not have throw rugs and other things on the floor that can make you trip. What can I do with my stairs? Do not leave any items on the stairs. Make sure that there are handrails on both sides of the stairs and use them. Fix handrails that are broken or loose. Make sure that handrails are as long as the stairways. Check any carpeting to make sure that it is firmly attached to the stairs. Fix any carpet that is loose or worn. Avoid having throw rugs at the top or bottom of the stairs. If you do have throw rugs, attach them to the floor with carpet tape. Make sure that you have a light switch at  the top of the stairs and the bottom of the stairs. If you do not have them, ask someone to add them for you. What else can I do to help prevent falls? Wear shoes that: Do not have high heels. Have rubber bottoms. Are comfortable and fit you well. Are closed at the toe. Do not wear sandals. If you use a stepladder: Make sure that it is fully opened. Do not climb a closed stepladder. Make sure that both sides of the stepladder are locked into place. Ask someone to hold it for you, if possible. Clearly mark and make sure that you can see: Any grab bars or handrails. First and last steps. Where the edge of each step is. Use tools that help you move around (mobility aids) if they are  needed. These include: Canes. Walkers. Scooters. Crutches. Turn on the lights when you go into a dark area. Replace any light bulbs as soon as they burn out. Set up your furniture so you have a clear path. Avoid moving your furniture around. If any of your floors are uneven, fix them. If there are any pets around you, be aware of where they are. Review your medicines with your doctor. Some medicines can make you feel dizzy. This can increase your chance of falling. Ask your doctor what other things that you can do to help prevent falls. This information is not intended to replace advice given to you by your health care provider. Make sure you discuss any questions you have with your health care provider. Document Released: 07/15/2009 Document Revised: 02/24/2016 Document Reviewed: 10/23/2014 Elsevier Interactive Patient Education  2017 [employer].

## 2022-05-15 ENCOUNTER — Encounter: Payer: Self-pay | Admitting: Internal Medicine

## 2022-05-15 ENCOUNTER — Ambulatory Visit (INDEPENDENT_AMBULATORY_CARE_PROVIDER_SITE_OTHER): Payer: Medicare HMO | Admitting: Internal Medicine

## 2022-05-15 VITALS — BP 124/70 | HR 80 | Resp 18 | Ht 67.0 in | Wt 194.0 lb

## 2022-05-15 DIAGNOSIS — J432 Centrilobular emphysema: Secondary | ICD-10-CM | POA: Diagnosis not present

## 2022-05-15 DIAGNOSIS — I10 Essential (primary) hypertension: Secondary | ICD-10-CM | POA: Diagnosis not present

## 2022-05-15 DIAGNOSIS — E1169 Type 2 diabetes mellitus with other specified complication: Secondary | ICD-10-CM | POA: Diagnosis not present

## 2022-05-15 DIAGNOSIS — Z720 Tobacco use: Secondary | ICD-10-CM

## 2022-05-15 DIAGNOSIS — Z125 Encounter for screening for malignant neoplasm of prostate: Secondary | ICD-10-CM

## 2022-05-15 DIAGNOSIS — G63 Polyneuropathy in diseases classified elsewhere: Secondary | ICD-10-CM | POA: Diagnosis not present

## 2022-05-15 DIAGNOSIS — Z0001 Encounter for general adult medical examination with abnormal findings: Secondary | ICD-10-CM

## 2022-05-15 DIAGNOSIS — E559 Vitamin D deficiency, unspecified: Secondary | ICD-10-CM

## 2022-05-15 DIAGNOSIS — N184 Chronic kidney disease, stage 4 (severe): Secondary | ICD-10-CM

## 2022-05-15 DIAGNOSIS — R7989 Other specified abnormal findings of blood chemistry: Secondary | ICD-10-CM | POA: Diagnosis not present

## 2022-05-15 DIAGNOSIS — M48062 Spinal stenosis, lumbar region with neurogenic claudication: Secondary | ICD-10-CM

## 2022-05-15 DIAGNOSIS — E1165 Type 2 diabetes mellitus with hyperglycemia: Secondary | ICD-10-CM

## 2022-05-15 DIAGNOSIS — E038 Other specified hypothyroidism: Secondary | ICD-10-CM

## 2022-05-15 DIAGNOSIS — E782 Mixed hyperlipidemia: Secondary | ICD-10-CM | POA: Diagnosis not present

## 2022-05-15 MED ORDER — GABAPENTIN 300 MG PO CAPS: 300.0000 mg | ORAL_CAPSULE | Freq: Three times a day (TID) | ORAL | 1 refills | Status: DC

## 2022-05-15 NOTE — Patient Instructions (Signed)
Please continue taking medications as prescribed.  Please continue to follow low carb diet and perform moderate exercise/walking as tolerated.  Please consider getting Shingrix vaccine at local pharmacy.

## 2022-05-15 NOTE — Assessment & Plan Note (Signed)
Smokes about 0.3 pack/day  Asked about quitting: confirms that he/she currently smokes cigarettes Advise to quit smoking: Educated about QUITTING to reduce the risk of cancer, cardio and cerebrovascular disease. Assess willingness: Unwilling to quit at this time, but is working on cutting back. Assist with counseling and pharmacotherapy: Counseled for 5 minutes and literature provided. Arrange for follow up: follow up in 3 months and continue to offer help. 

## 2022-05-16 ENCOUNTER — Other Ambulatory Visit: Payer: Self-pay | Admitting: *Deleted

## 2022-05-16 ENCOUNTER — Telehealth: Payer: Self-pay

## 2022-05-16 DIAGNOSIS — R7989 Other specified abnormal findings of blood chemistry: Secondary | ICD-10-CM

## 2022-05-16 NOTE — Telephone Encounter (Signed)
Patient advised with verbal understanding  

## 2022-05-16 NOTE — Telephone Encounter (Signed)
Patient returning lab results call 

## 2022-05-17 LAB — HEMOGLOBIN A1C
Est. average glucose Bld gHb Est-mCnc: 180 mg/dL
Hgb A1c MFr Bld: 7.9 % — ABNORMAL HIGH (ref 4.8–5.6)

## 2022-05-17 LAB — CMP14+EGFR
ALT: 10 IU/L (ref 0–44)
AST: 12 IU/L (ref 0–40)
Albumin/Globulin Ratio: 1.7 (ref 1.2–2.2)
Albumin: 4.6 g/dL (ref 3.8–4.8)
Alkaline Phosphatase: 87 IU/L (ref 44–121)
BUN/Creatinine Ratio: 14 (ref 10–24)
BUN: 31 mg/dL — ABNORMAL HIGH (ref 8–27)
Bilirubin Total: 0.4 mg/dL (ref 0.0–1.2)
CO2: 22 mmol/L (ref 20–29)
Calcium: 9 mg/dL (ref 8.6–10.2)
Chloride: 106 mmol/L (ref 96–106)
Creatinine, Ser: 2.23 mg/dL — ABNORMAL HIGH (ref 0.76–1.27)
Globulin, Total: 2.7 g/dL (ref 1.5–4.5)
Glucose: 84 mg/dL (ref 70–99)
Potassium: 4.3 mmol/L (ref 3.5–5.2)
Sodium: 143 mmol/L (ref 134–144)
Total Protein: 7.3 g/dL (ref 6.0–8.5)
eGFR: 31 mL/min/{1.73_m2} — ABNORMAL LOW (ref >59)

## 2022-05-17 LAB — CBC WITH DIFFERENTIAL/PLATELET
Basophils Absolute: 0.1 10*3/uL (ref 0.0–0.2)
Basos: 2 %
EOS (ABSOLUTE): 0.4 10*3/uL (ref 0.0–0.4)
Eos: 6 %
Hematocrit: 31.6 % — ABNORMAL LOW (ref 37.5–51.0)
Hemoglobin: 10.7 g/dL — ABNORMAL LOW (ref 13.0–17.7)
Immature Grans (Abs): 0 10*3/uL (ref 0.0–0.1)
Immature Granulocytes: 0 %
Lymphocytes Absolute: 2.3 10*3/uL (ref 0.7–3.1)
Lymphs: 35 %
MCH: 30.7 pg (ref 26.6–33.0)
MCHC: 33.9 g/dL (ref 31.5–35.7)
MCV: 91 fL (ref 79–97)
Monocytes Absolute: 0.5 10*3/uL (ref 0.1–0.9)
Monocytes: 7 %
Neutrophils Absolute: 3.3 10*3/uL (ref 1.4–7.0)
Neutrophils: 50 %
Platelets: 207 10*3/uL (ref 150–450)
RBC: 3.49 x10E6/uL — ABNORMAL LOW (ref 4.14–5.80)
RDW: 11.8 % (ref 11.6–15.4)
WBC: 6.5 10*3/uL (ref 3.4–10.8)

## 2022-05-17 LAB — LIPID PANEL
Chol/HDL Ratio: 3.3 ratio (ref 0.0–5.0)
Cholesterol, Total: 99 mg/dL — ABNORMAL LOW (ref 100–199)
HDL: 30 mg/dL — ABNORMAL LOW (ref >39)
LDL Chol Calc (NIH): 50 mg/dL (ref 0–99)
Triglycerides: 97 mg/dL (ref 0–149)
VLDL Cholesterol Cal: 19 mg/dL (ref 5–40)

## 2022-05-17 LAB — PSA: Prostate Specific Ag, Serum: 0.9 ng/mL (ref 0.0–4.0)

## 2022-05-17 LAB — VITAMIN D 25 HYDROXY (VIT D DEFICIENCY, FRACTURES): Vit D, 25-Hydroxy: 78.5 ng/mL (ref 30.0–100.0)

## 2022-05-17 LAB — TSH: TSH: 6.19 u[IU]/mL — ABNORMAL HIGH (ref 0.450–4.500)

## 2022-05-18 LAB — T4, FREE: Free T4: 0.89 ng/dL (ref 0.82–1.77)

## 2022-05-18 LAB — SPECIMEN STATUS REPORT

## 2022-05-19 DIAGNOSIS — M48062 Spinal stenosis, lumbar region with neurogenic claudication: Secondary | ICD-10-CM | POA: Insufficient documentation

## 2022-05-19 DIAGNOSIS — Z125 Encounter for screening for malignant neoplasm of prostate: Secondary | ICD-10-CM | POA: Insufficient documentation

## 2022-05-19 NOTE — Assessment & Plan Note (Addendum)
Ordered PSA after discussing its limitations for prostate cancer screening, including false positive results leading additional investigations. 

## 2022-05-19 NOTE — Assessment & Plan Note (Signed)
Physical exam as documented. Fasting blood tests today. 

## 2022-05-19 NOTE — Assessment & Plan Note (Signed)
BP Readings from Last 1 Encounters:  05/15/22 124/70   Well-controlled with Amlodipine Was on Losartan, was switched to Amlodipine recently due to AKI Counseled for compliance with the medications Advised DASH diet and moderate exercise/walking as tolerated

## 2022-05-19 NOTE — Assessment & Plan Note (Signed)
Last BMP reviewed Check BMP today Followed by Nephrology Avoid nephrotoxic agents Advised to maintain adequate hydration

## 2022-05-19 NOTE — Assessment & Plan Note (Signed)
Chronic low back pain with numbness and weakness of the LE On gabapentin 200 mg 3 times daily, increased dose to 300 mg 3 times daily Avoid frequent bending and heavy lifting Back brace as needed

## 2022-05-19 NOTE — Assessment & Plan Note (Signed)
Lab Results  Component Value Date   HGBA1C 7.9 (H) 05/15/2022    On Glimepiride 2 mg BID Was on metformin, which was discontinued recently due to AKI Advised to follow diabetic diet On statin  F/u BMP and HbA1C Diabetic eye exam: Advised to follow up with Ophthalmology for diabetic eye exam

## 2022-05-19 NOTE — Assessment & Plan Note (Signed)
On statin.

## 2022-05-19 NOTE — Progress Notes (Signed)
Established Patient Office Visit  Subjective:  Patient ID: Collin Yoder, male    DOB: 03/29/1951  Age: 71 y.o. MRN: [id number]  CC:  Chief Complaint  Patient presents with   Annual Exam    Annual exam pt is still having back pain wanted to discuss highering the dose of gabapentin    HPI Collin Yoder is a 71 y.o. male with past medical history of CVA, HTN, COPD, type II DM with neuropathy and tobacco abuse who presents for annual physical.  HTN: BP is well-controlled. Takes medications regularly. Patient denies headache, dizziness, chest pain, dyspnea or palpitations.   Type 2 DM: His last HbA1C was 7.0. He is on Glimepiride currently.  He admits that he has not been following low-carb diet recently. He also takes Gabapentin for DM neuropathy. He states that he has balance problem due to neuropathy.  He has not been able to see Nephrologist since 01/23. His last BMP showed GFR of 29. Denies any dysuria, hematuria or urinary hesitancy/resistance.  He complains of low back pain, which is chronic, worse with movement and slightly better with rest.  He has chronic numbness and weakness of the bilateral LE.  Past Medical History:  Diagnosis Date   Borderline diabetes    Peripheral neuropathy 07/15/2019   Right lower lobe lung mass 04/11/2017   Thalamic stroke ([place]) 07/15/2019   Type II diabetes mellitus, uncontrolled 07/15/2019   Vitamin D deficiency disease 07/15/2019    Past Surgical History:  Procedure Laterality Date   APPENDECTOMY     CATARACT EXTRACTION, BILATERAL Bilateral 01/2021    Family History  Problem Relation Age of Onset   Diabetes Mother 56   Stroke Mother    Colon cancer Neg Hx     Social History   Socioeconomic History   Marital status: Single    Spouse name: Not on file   Number of children: Not on file   Years of education: Not on file   Highest education level: Not on file  Occupational History   Not on file  Tobacco Use   Smoking  status: Every Day    Packs/day: 1.00    Years: 45.00    Total pack years: 45.00    Types: Cigarettes   Smokeless tobacco: Never  Vaping Use   Vaping Use: Never used  Substance and Sexual Activity   Alcohol use: Yes    Alcohol/week: 1.0 standard drink of alcohol    Types: 1 Cans of beer per week    Comment: every 3 days   Drug use: No   Sexual activity: Not on file  Other Topics Concern   Not on file  Social History Narrative   Single.Lives alone in a motel.Works at [employer].   Social Determinants of Health   Financial Resource Strain: Low Risk  (03/15/2022)   Overall Financial Resource Strain (CARDIA)    Difficulty of Paying Living Expenses: Not hard at all  Food Insecurity: No Food Insecurity (03/15/2022)   Hunger Vital Sign    Worried About Running Out of Food in the Last Year: Never true    Ran Out of Food in the Last Year: Never true  Transportation Needs: No Transportation Needs (03/15/2022)   PRAPARE - Hydrologist (Medical): No    Lack of Transportation (Non-Medical): No  Physical Activity: Sufficiently Active (03/15/2022)   Exercise Vital Sign    Days of Exercise per Week: 7 days    Minutes of  Exercise per Session: 50 min  Stress: No Stress Concern Present (03/15/2022)   [place]    Feeling of Stress : Not at all  Social Connections: Socially Isolated (03/15/2022)   Social Connection and Isolation Panel [NHANES]    Frequency of Communication with Friends and Family: More than three times a week    Frequency of Social Gatherings with Friends and Family: More than three times a week    Attends Religious Services: Never    Marine scientist or Organizations: No    Attends Archivist Meetings: Never    Marital Status: Divorced  Human resources officer Violence: Not At Risk (03/15/2022)   Humiliation, Afraid, Rape, and Kick questionnaire    Fear of Current or  Ex-Partner: No    Emotionally Abused: No    Physically Abused: No    Sexually Abused: No    Outpatient Medications Prior to Visit  Medication Sig Dispense Refill   acetaminophen (TYLENOL) 325 MG tablet Take 2 tablets (650 mg total) by mouth every 6 (six) hours as needed for mild pain (or Fever >/= 101). 12 tablet 0   Alcohol Swabs (B-D SINGLE USE SWABS REGULAR) PADS 1 Units by Does not apply route daily at 12 noon. 100 each 1   amLODipine (NORVASC) 10 MG tablet Take 1 tablet (10 mg total) by mouth daily. 30 tablet 11   aspirin EC 81 MG tablet Take 1 tablet (81 mg total) by mouth daily with breakfast. 30 tablet 2   atorvastatin (LIPITOR) 40 MG tablet Take 1 tablet (40 mg total) by mouth daily. 90 tablet 3   Blood Glucose Monitoring Suppl (TRUE METRIX METER) w/Device KIT USE AS DIRECTED 1 kit 0   Cholecalciferol (VITAMIN D3) 5000 units CAPS Take 1 capsule by mouth daily.     glimepiride (AMARYL) 2 MG tablet Take 1 tablet (2 mg total) by mouth 2 (two) times daily before a meal. In Place of Metformin 60 tablet 11   glucose blood (TRUE METRIX BLOOD GLUCOSE TEST) test strip Use as instructed 100 each 12   TRUEplus Lancets 33G MISC 1 Units by Does not apply route daily at 12 noon. 100 each 1   gabapentin (NEURONTIN) 100 MG capsule Take 2 capsules (200 mg total) by mouth 3 (three) times daily. 360 capsule 2   No facility-administered medications prior to visit.    No Known Allergies  ROS Review of Systems  Constitutional:  Negative for chills and fever.  HENT:  Negative for congestion and sore throat.   Eyes:  Negative for pain and discharge.  Respiratory:  Negative for cough and shortness of breath.   Cardiovascular:  Negative for chest pain and palpitations.  Gastrointestinal:  Negative for diarrhea, nausea and vomiting.  Endocrine: Negative for polydipsia and polyuria.  Genitourinary:  Negative for dysuria and hematuria.  Musculoskeletal:  Negative for neck pain and neck stiffness.   Skin:  Negative for rash.  Neurological:  Positive for tremors, weakness and numbness. Negative for dizziness and headaches.  Psychiatric/Behavioral:  Negative for agitation and behavioral problems.       Objective:    Physical Exam Vitals reviewed.  Constitutional:      General: He is not in acute distress.    Appearance: He is not diaphoretic.  HENT:     Head: Normocephalic and atraumatic.     Nose: Nose normal.     Mouth/Throat:     Mouth: Mucous membranes are moist.  Eyes:     General: No scleral icterus.    Extraocular Movements: Extraocular movements intact.  Cardiovascular:     Rate and Rhythm: Normal rate and regular rhythm.     Pulses: Normal pulses.     Heart sounds: Normal heart sounds. No murmur heard. Pulmonary:     Breath sounds: Normal breath sounds. No wheezing or rales.  Abdominal:     Palpations: Abdomen is soft.     Tenderness: There is no abdominal tenderness.  Musculoskeletal:     Cervical back: Neck supple. No tenderness.     Right lower leg: No edema.     Left lower leg: No edema.  Skin:    General: Skin is warm.     Findings: No rash.  Neurological:     General: No focal deficit present.     Mental Status: He is alert and oriented to person, place, and time.     Sensory: Sensory deficit (B/l feet) present.     Motor: Weakness (B/l LE - 4/5) present.     Comments: Resting tremors of right hand  Psychiatric:        Mood and Affect: Mood normal.        Behavior: Behavior normal.     BP 124/70 (BP Location: Right Arm, Patient Position: Sitting, Cuff Size: Normal)   Pulse 80   Resp 18   Ht 5' 7"  (1.702 m)   Wt 194 lb (88 kg)   SpO2 94%   BMI 30.38 kg/m  Wt Readings from Last 3 Encounters:  05/15/22 194 lb (88 kg)  01/10/22 193 lb (87.5 kg)  10/12/21 187 lb 1.3 oz (84.9 kg)    Lab Results  Component Value Date   TSH 6.190 (H) 05/15/2022   Lab Results  Component Value Date   WBC 6.5 05/15/2022   HGB 10.7 (L) 05/15/2022   HCT  31.6 (L) 05/15/2022   MCV 91 05/15/2022   PLT 207 05/15/2022   Lab Results  Component Value Date   NA 143 05/15/2022   K 4.3 05/15/2022   CO2 22 05/15/2022   GLUCOSE 84 05/15/2022   BUN 31 (H) 05/15/2022   CREATININE 2.23 (H) 05/15/2022   BILITOT 0.4 05/15/2022   ALKPHOS 87 05/15/2022   AST 12 05/15/2022   ALT 10 05/15/2022   PROT 7.3 05/15/2022   ALBUMIN 4.6 05/15/2022   CALCIUM 9.0 05/15/2022   ANIONGAP 9 08/07/2021   EGFR 31 (L) 05/15/2022   Lab Results  Component Value Date   CHOL 99 (L) 05/15/2022   Lab Results  Component Value Date   HDL 30 (L) 05/15/2022   Lab Results  Component Value Date   LDLCALC 50 05/15/2022   Lab Results  Component Value Date   TRIG 97 05/15/2022   Lab Results  Component Value Date   CHOLHDL 3.3 05/15/2022   Lab Results  Component Value Date   HGBA1C 7.9 (H) 05/15/2022      Assessment & Plan:   Problem List Items Addressed This Visit       Cardiovascular and Mediastinum   Hypertension    BP Readings from Last 1 Encounters:  05/15/22 124/70  Well-controlled with Amlodipine Was on Losartan, was switched to Amlodipine recently due to AKI Counseled for compliance with the medications Advised DASH diet and moderate exercise/walking as tolerated      Relevant Orders   TSH (Completed)     Respiratory   Centrilobular emphysema (HCC)     Endocrine  Diabetes mellitus ([place])    Lab Results  Component Value Date   HGBA1C 7.9 (H) 05/15/2022   On Glimepiride 2 mg BID Was on metformin, which was discontinued recently due to AKI Advised to follow diabetic diet On statin  F/u BMP and HbA1C Diabetic eye exam: Advised to follow up with Ophthalmology for diabetic eye exam      Relevant Orders   Lipid panel (Completed)   Hemoglobin A1c (Completed)   CMP14+EGFR (Completed)     Nervous and Auditory   Peripheral neuropathy    Likely due to DM Takes gabapentin 200 mg TID Uncontrolled Increased dose of gabapentin to 300  mg 3 times daily      Relevant Medications   gabapentin (NEURONTIN) 300 MG capsule   Other Relevant Orders   TSH (Completed)   Lipid panel (Completed)   CBC with Differential/Platelet (Completed)     Genitourinary   Stage 4 chronic kidney disease ([place])    Last BMP reviewed Check BMP today Followed by Nephrology Avoid nephrotoxic agents Advised to maintain adequate hydration      Relevant Orders   TSH (Completed)     Other   Hyperlipidemia    On statin      Relevant Orders   Lipid panel (Completed)   Tobacco abuse    Smokes about 0.3 pack/day  Asked about quitting: confirms that he/she currently smokes cigarettes Advise to quit smoking: Educated about QUITTING to reduce the risk of cancer, cardio and cerebrovascular disease. Assess willingness: Unwilling to quit at this time, but is working on cutting back. Assist with counseling and pharmacotherapy: Counseled for 5 minutes and literature provided. Arrange for follow up: follow up in 3 months and continue to offer help.      Encounter for general adult medical examination with abnormal findings - Primary    Physical exam as documented. Fasting blood tests today.      Relevant Orders   TSH (Completed)   CMP14+EGFR (Completed)   CBC with Differential/Platelet (Completed)   Prostate cancer screening    Ordered PSA after discussing its limitations for prostate cancer screening, including false positive results leading additional investigations.       Relevant Orders   PSA (Completed)   Spinal stenosis of lumbar region with neurogenic claudication    Chronic low back pain with numbness and weakness of the LE On gabapentin 200 mg 3 times daily, increased dose to 300 mg 3 times daily Avoid frequent bending and heavy lifting Back brace as needed      Relevant Medications   gabapentin (NEURONTIN) 300 MG capsule   Other Visit Diagnoses     Vitamin D deficiency       Relevant Orders   VITAMIN D 25 Hydroxy  (Vit-D Deficiency, Fractures) (Completed)       Meds ordered this encounter  Medications   gabapentin (NEURONTIN) 300 MG capsule    Sig: Take 1 capsule (300 mg total) by mouth 3 (three) times daily.    Dispense:  270 capsule    Refill:  1    Dose change    Follow-up: Return in about 4 months (around 09/14/2022) for DM and CKD.    Lindell Spar, MD

## 2022-05-19 NOTE — Assessment & Plan Note (Signed)
Likely due to DM Takes gabapentin 200 mg TID Uncontrolled Increased dose of gabapentin to 300 mg 3 times daily

## 2022-06-14 DIAGNOSIS — N170 Acute kidney failure with tubular necrosis: Secondary | ICD-10-CM | POA: Diagnosis not present

## 2022-06-14 DIAGNOSIS — D508 Other iron deficiency anemias: Secondary | ICD-10-CM | POA: Diagnosis not present

## 2022-06-14 DIAGNOSIS — D638 Anemia in other chronic diseases classified elsewhere: Secondary | ICD-10-CM | POA: Diagnosis not present

## 2022-06-14 DIAGNOSIS — I129 Hypertensive chronic kidney disease with stage 1 through stage 4 chronic kidney disease, or unspecified chronic kidney disease: Secondary | ICD-10-CM | POA: Diagnosis not present

## 2022-06-14 DIAGNOSIS — D472 Monoclonal gammopathy: Secondary | ICD-10-CM | POA: Diagnosis not present

## 2022-06-14 DIAGNOSIS — R319 Hematuria, unspecified: Secondary | ICD-10-CM | POA: Diagnosis not present

## 2022-06-14 DIAGNOSIS — R809 Proteinuria, unspecified: Secondary | ICD-10-CM | POA: Diagnosis not present

## 2022-06-14 DIAGNOSIS — E211 Secondary hyperparathyroidism, not elsewhere classified: Secondary | ICD-10-CM | POA: Diagnosis not present

## 2022-06-14 DIAGNOSIS — N189 Chronic kidney disease, unspecified: Secondary | ICD-10-CM | POA: Diagnosis not present

## 2022-06-20 DIAGNOSIS — E1142 Type 2 diabetes mellitus with diabetic polyneuropathy: Secondary | ICD-10-CM | POA: Diagnosis not present

## 2022-06-20 DIAGNOSIS — L84 Corns and callosities: Secondary | ICD-10-CM | POA: Diagnosis not present

## 2022-06-20 DIAGNOSIS — B351 Tinea unguium: Secondary | ICD-10-CM | POA: Diagnosis not present

## 2022-06-20 DIAGNOSIS — M79676 Pain in unspecified toe(s): Secondary | ICD-10-CM | POA: Diagnosis not present

## 2022-07-12 ENCOUNTER — Other Ambulatory Visit: Payer: Self-pay | Admitting: Internal Medicine

## 2022-07-12 DIAGNOSIS — E1165 Type 2 diabetes mellitus with hyperglycemia: Secondary | ICD-10-CM

## 2022-08-08 ENCOUNTER — Other Ambulatory Visit: Payer: Self-pay | Admitting: Internal Medicine

## 2022-08-09 ENCOUNTER — Encounter: Payer: Self-pay | Admitting: Internal Medicine

## 2022-08-09 ENCOUNTER — Ambulatory Visit (INDEPENDENT_AMBULATORY_CARE_PROVIDER_SITE_OTHER): Payer: Medicare HMO | Admitting: Internal Medicine

## 2022-08-09 DIAGNOSIS — R319 Hematuria, unspecified: Secondary | ICD-10-CM | POA: Diagnosis not present

## 2022-08-09 DIAGNOSIS — E211 Secondary hyperparathyroidism, not elsewhere classified: Secondary | ICD-10-CM | POA: Diagnosis not present

## 2022-08-09 DIAGNOSIS — R809 Proteinuria, unspecified: Secondary | ICD-10-CM | POA: Diagnosis not present

## 2022-08-09 DIAGNOSIS — N170 Acute kidney failure with tubular necrosis: Secondary | ICD-10-CM | POA: Diagnosis not present

## 2022-08-09 DIAGNOSIS — E1129 Type 2 diabetes mellitus with other diabetic kidney complication: Secondary | ICD-10-CM | POA: Diagnosis not present

## 2022-08-09 DIAGNOSIS — D508 Other iron deficiency anemias: Secondary | ICD-10-CM | POA: Diagnosis not present

## 2022-08-09 DIAGNOSIS — I10 Essential (primary) hypertension: Secondary | ICD-10-CM

## 2022-08-09 DIAGNOSIS — N189 Chronic kidney disease, unspecified: Secondary | ICD-10-CM | POA: Diagnosis not present

## 2022-08-09 DIAGNOSIS — E1122 Type 2 diabetes mellitus with diabetic chronic kidney disease: Secondary | ICD-10-CM | POA: Diagnosis not present

## 2022-08-09 DIAGNOSIS — I129 Hypertensive chronic kidney disease with stage 1 through stage 4 chronic kidney disease, or unspecified chronic kidney disease: Secondary | ICD-10-CM | POA: Diagnosis not present

## 2022-08-09 MED ORDER — AMLODIPINE BESYLATE 10 MG PO TABS: 10.0000 mg | ORAL_TABLET | Freq: Every day | ORAL | 3 refills | Status: DC

## 2022-08-09 NOTE — Progress Notes (Signed)
Refilled Amlodipine after speaking to the patient. Does not have any other concern.

## 2022-09-11 ENCOUNTER — Other Ambulatory Visit: Payer: Self-pay | Admitting: Internal Medicine

## 2022-09-11 DIAGNOSIS — N170 Acute kidney failure with tubular necrosis: Secondary | ICD-10-CM | POA: Diagnosis not present

## 2022-09-11 DIAGNOSIS — I129 Hypertensive chronic kidney disease with stage 1 through stage 4 chronic kidney disease, or unspecified chronic kidney disease: Secondary | ICD-10-CM | POA: Diagnosis not present

## 2022-09-11 DIAGNOSIS — E1129 Type 2 diabetes mellitus with other diabetic kidney complication: Secondary | ICD-10-CM | POA: Diagnosis not present

## 2022-09-11 DIAGNOSIS — N189 Chronic kidney disease, unspecified: Secondary | ICD-10-CM | POA: Diagnosis not present

## 2022-09-11 DIAGNOSIS — R809 Proteinuria, unspecified: Secondary | ICD-10-CM | POA: Diagnosis not present

## 2022-09-11 DIAGNOSIS — E1122 Type 2 diabetes mellitus with diabetic chronic kidney disease: Secondary | ICD-10-CM | POA: Diagnosis not present

## 2022-09-11 DIAGNOSIS — E211 Secondary hyperparathyroidism, not elsewhere classified: Secondary | ICD-10-CM | POA: Diagnosis not present

## 2022-09-11 DIAGNOSIS — D638 Anemia in other chronic diseases classified elsewhere: Secondary | ICD-10-CM | POA: Diagnosis not present

## 2022-09-12 DIAGNOSIS — L84 Corns and callosities: Secondary | ICD-10-CM | POA: Diagnosis not present

## 2022-09-12 DIAGNOSIS — B351 Tinea unguium: Secondary | ICD-10-CM | POA: Diagnosis not present

## 2022-09-12 DIAGNOSIS — M79676 Pain in unspecified toe(s): Secondary | ICD-10-CM | POA: Diagnosis not present

## 2022-09-12 DIAGNOSIS — E1142 Type 2 diabetes mellitus with diabetic polyneuropathy: Secondary | ICD-10-CM | POA: Diagnosis not present

## 2022-09-18 ENCOUNTER — Ambulatory Visit (INDEPENDENT_AMBULATORY_CARE_PROVIDER_SITE_OTHER): Payer: Medicare HMO | Admitting: Internal Medicine

## 2022-09-18 ENCOUNTER — Encounter: Payer: Self-pay | Admitting: Internal Medicine

## 2022-09-18 VITALS — BP 121/69 | HR 88 | Ht 67.0 in | Wt 193.6 lb

## 2022-09-18 DIAGNOSIS — E038 Other specified hypothyroidism: Secondary | ICD-10-CM

## 2022-09-18 DIAGNOSIS — Z23 Encounter for immunization: Secondary | ICD-10-CM

## 2022-09-18 DIAGNOSIS — N184 Chronic kidney disease, stage 4 (severe): Secondary | ICD-10-CM | POA: Diagnosis not present

## 2022-09-18 DIAGNOSIS — E1142 Type 2 diabetes mellitus with diabetic polyneuropathy: Secondary | ICD-10-CM | POA: Diagnosis not present

## 2022-09-18 DIAGNOSIS — I10 Essential (primary) hypertension: Secondary | ICD-10-CM | POA: Diagnosis not present

## 2022-09-18 DIAGNOSIS — Z720 Tobacco use: Secondary | ICD-10-CM

## 2022-09-18 NOTE — Assessment & Plan Note (Signed)
BP Readings from Last 1 Encounters:  09/18/22 121/69   Well-controlled with Amlodipine Was on Losartan, was switched to Amlodipine recently due to AKI Counseled for compliance with the medications Advised DASH diet and moderate exercise/walking as tolerated

## 2022-09-18 NOTE — Assessment & Plan Note (Signed)
Smokes about 0.2 pack/day  Asked about quitting: confirms that he/she currently smokes cigarettes Advise to quit smoking: Educated about QUITTING to reduce the risk of cancer, cardio and cerebrovascular disease. Assess willingness: Unwilling to quit at this time, but is working on cutting back. Assist with counseling and pharmacotherapy: Counseled for 5 minutes and literature provided. Arrange for follow up: follow up in 3 months and continue to offer help.

## 2022-09-18 NOTE — Assessment & Plan Note (Addendum)
Lab Results  Component Value Date   HGBA1C 7.9 (H) 05/15/2022    On Glimepiride 2 mg BID Was on metformin, which was discontinued recently due to AKI Advised to follow diabetic diet On statin  F/u BMP and HbA1C Diabetic eye exam: Advised to follow up with Ophthalmology for diabetic eye exam  On gabapentin 300 mg 3 times daily for diabetic neuropathy

## 2022-09-18 NOTE — Assessment & Plan Note (Signed)
Lab Results  Component Value Date   TSH 6.190 (H) 05/15/2022   Recheck TSH and free T4 Asymptomatic currently

## 2022-09-18 NOTE — Progress Notes (Signed)
Established Patient Office Visit  Subjective:  Patient ID: Collin Yoder, male    DOB: Feb 24, 1951  Age: 71 y.o. MRN: [id number]  CC:  Chief Complaint  Patient presents with   Follow-up    HPI Collin Yoder is a 71 y.o. male with past medical history of CVA, HTN, COPD, type II DM with neuropathy and tobacco abuse who presents for f/u of his chronic medical conditions.  HTN: BP is well-controlled. Takes medications regularly. Patient denies headache, dizziness, chest pain, dyspnea or palpitations.   Type 2 DM: His last HbA1C was 7.9. He is on Glimepiride currently.  He admits that he has not been following low-carb diet recently.  His blood close has been elevated, mostly above 150, some times around 190.  He also takes Gabapentin for DM neuropathy. He states that he had balance problem due to neuropathy, but has been better since increasing dose of Gabapentin.  He saw Nephrologist about a week ago. His last BMP showed GFR of 26. Denies any dysuria, hematuria or urinary hesitancy/resistance.  Subclinical hypothyroidism: His TSH was borderline elevated in 08/23 with free T4 WNL.  He denies any recent change in weight or appetite.     Past Medical History:  Diagnosis Date   Borderline diabetes    Peripheral neuropathy 07/15/2019   Right lower lobe lung mass 04/11/2017   Thalamic stroke ([place]) 07/15/2019   Type II diabetes mellitus, uncontrolled 07/15/2019   Vitamin D deficiency disease 07/15/2019    Past Surgical History:  Procedure Laterality Date   APPENDECTOMY     CATARACT EXTRACTION, BILATERAL Bilateral 01/2021    Family History  Problem Relation Age of Onset   Diabetes Mother 18   Stroke Mother    Colon cancer Neg Hx     Social History   Socioeconomic History   Marital status: Single    Spouse name: Not on file   Number of children: Not on file   Years of education: Not on file   Highest education level: Not on file  Occupational History   Not on file   Tobacco Use   Smoking status: Every Day    Packs/day: 1.00    Years: 45.00    Total pack years: 45.00    Types: Cigarettes   Smokeless tobacco: Never  Vaping Use   Vaping Use: Never used  Substance and Sexual Activity   Alcohol use: Yes    Alcohol/week: 1.0 standard drink of alcohol    Types: 1 Cans of beer per week    Comment: every 3 days   Drug use: No   Sexual activity: Not on file  Other Topics Concern   Not on file  Social History Narrative   Single.Lives alone in a motel.Works at [employer].   Social Determinants of Health   Financial Resource Strain: Low Risk  (03/15/2022)   Overall Financial Resource Strain (CARDIA)    Difficulty of Paying Living Expenses: Not hard at all  Food Insecurity: No Food Insecurity (03/15/2022)   Hunger Vital Sign    Worried About Running Out of Food in the Last Year: Never true    Ran Out of Food in the Last Year: Never true  Transportation Needs: No Transportation Needs (03/15/2022)   PRAPARE - Hydrologist (Medical): No    Lack of Transportation (Non-Medical): No  Physical Activity: Sufficiently Active (03/15/2022)   Exercise Vital Sign    Days of Exercise per Week: 7 days  Minutes of Exercise per Session: 50 min  Stress: No Stress Concern Present (03/15/2022)   [place]    Feeling of Stress : Not at all  Social Connections: Socially Isolated (03/15/2022)   Social Connection and Isolation Panel [NHANES]    Frequency of Communication with Friends and Family: More than three times a week    Frequency of Social Gatherings with Friends and Family: More than three times a week    Attends Religious Services: Never    Marine scientist or Organizations: No    Attends Archivist Meetings: Never    Marital Status: Divorced  Human resources officer Violence: Not At Risk (03/15/2022)   Humiliation, Afraid, Rape, and Kick questionnaire     Fear of Current or Ex-Partner: No    Emotionally Abused: No    Physically Abused: No    Sexually Abused: No    Outpatient Medications Prior to Visit  Medication Sig Dispense Refill   acetaminophen (TYLENOL) 325 MG tablet Take 2 tablets (650 mg total) by mouth every 6 (six) hours as needed for mild pain (or Fever >/= 101). 12 tablet 0   Alcohol Swabs (B-D SINGLE USE SWABS REGULAR) PADS 1 Units by Does not apply route daily at 12 noon. 100 each 1   amLODipine (NORVASC) 10 MG tablet Take 1 tablet (10 mg total) by mouth daily. 90 tablet 3   atorvastatin (LIPITOR) 40 MG tablet Take 1 tablet (40 mg total) by mouth daily. 90 tablet 3   Blood Glucose Monitoring Suppl (TRUE METRIX METER) w/Device KIT USE AS DIRECTED 1 kit 0   Cholecalciferol (VITAMIN D3) 5000 units CAPS Take 1 capsule by mouth daily.     gabapentin (NEURONTIN) 300 MG capsule Take 1 capsule (300 mg total) by mouth 3 (three) times daily. 270 capsule 1   glimepiride (AMARYL) 2 MG tablet TAKE 1 TABLET BY MOUTH TWICE DAILY BEFORE A MEAL 60 tablet 0   glucose blood (TRUE METRIX BLOOD GLUCOSE TEST) test strip Use as instructed 100 each 12   TRUEplus Lancets 33G MISC USE TO TEST DAILY AT 12 NOON. 100 each 10   No facility-administered medications prior to visit.    No Known Allergies  ROS Review of Systems  Constitutional:  Negative for chills and fever.  HENT:  Negative for congestion and sore throat.   Eyes:  Negative for pain and discharge.  Respiratory:  Negative for cough and shortness of breath.   Cardiovascular:  Negative for chest pain and palpitations.  Gastrointestinal:  Negative for diarrhea, nausea and vomiting.  Endocrine: Negative for polydipsia and polyuria.  Genitourinary:  Negative for dysuria and hematuria.  Musculoskeletal:  Negative for neck pain and neck stiffness.  Skin:  Negative for rash.  Neurological:  Positive for tremors, weakness and numbness. Negative for dizziness and headaches.   Psychiatric/Behavioral:  Negative for agitation and behavioral problems.       Objective:    Physical Exam Vitals reviewed.  Constitutional:      General: He is not in acute distress.    Appearance: He is not diaphoretic.  HENT:     Head: Normocephalic and atraumatic.     Nose: Nose normal.     Mouth/Throat:     Mouth: Mucous membranes are moist.  Eyes:     General: No scleral icterus.    Extraocular Movements: Extraocular movements intact.  Cardiovascular:     Rate and Rhythm: Normal rate and regular rhythm.  Pulses: Normal pulses.     Heart sounds: Normal heart sounds. No murmur heard. Pulmonary:     Breath sounds: Normal breath sounds. No wheezing or rales.  Abdominal:     Palpations: Abdomen is soft.     Tenderness: There is no abdominal tenderness.  Musculoskeletal:     Cervical back: Neck supple. No tenderness.     Right lower leg: No edema.     Left lower leg: No edema.  Skin:    General: Skin is warm.     Findings: No rash.  Neurological:     General: No focal deficit present.     Mental Status: He is alert and oriented to person, place, and time.     Sensory: Sensory deficit (B/l feet) present.     Motor: Weakness (B/l LE - 4/5) present.     Comments: Resting tremors of right hand  Psychiatric:        Mood and Affect: Mood normal.        Behavior: Behavior normal.     BP 121/69 (BP Location: Right Arm, Patient Position: Sitting, Cuff Size: Normal)   Pulse 88   Ht 5' 7" (1.702 m)   Wt 193 lb 9.6 oz (87.8 kg)   SpO2 92%   BMI 30.32 kg/m  Wt Readings from Last 3 Encounters:  09/18/22 193 lb 9.6 oz (87.8 kg)  05/15/22 194 lb (88 kg)  01/10/22 193 lb (87.5 kg)    Lab Results  Component Value Date   TSH 6.190 (H) 05/15/2022   Lab Results  Component Value Date   WBC 6.5 05/15/2022   HGB 10.7 (L) 05/15/2022   HCT 31.6 (L) 05/15/2022   MCV 91 05/15/2022   PLT 207 05/15/2022   Lab Results  Component Value Date   NA 143 05/15/2022   K 4.3  05/15/2022   CO2 22 05/15/2022   GLUCOSE 84 05/15/2022   BUN 31 (H) 05/15/2022   CREATININE 2.23 (H) 05/15/2022   BILITOT 0.4 05/15/2022   ALKPHOS 87 05/15/2022   AST 12 05/15/2022   ALT 10 05/15/2022   PROT 7.3 05/15/2022   ALBUMIN 4.6 05/15/2022   CALCIUM 9.0 05/15/2022   ANIONGAP 9 08/07/2021   EGFR 31 (L) 05/15/2022   Lab Results  Component Value Date   CHOL 99 (L) 05/15/2022   Lab Results  Component Value Date   HDL 30 (L) 05/15/2022   Lab Results  Component Value Date   LDLCALC 50 05/15/2022   Lab Results  Component Value Date   TRIG 97 05/15/2022   Lab Results  Component Value Date   CHOLHDL 3.3 05/15/2022   Lab Results  Component Value Date   HGBA1C 7.9 (H) 05/15/2022      Assessment & Plan:   Problem List Items Addressed This Visit       Cardiovascular and Mediastinum   Hypertension    BP Readings from Last 1 Encounters:  09/18/22 121/69  Well-controlled with Amlodipine Was on Losartan, was switched to Amlodipine recently due to AKI Counseled for compliance with the medications Advised DASH diet and moderate exercise/walking as tolerated        Endocrine   Diabetes mellitus ([place])    Lab Results  Component Value Date   HGBA1C 7.9 (H) 05/15/2022   On Glimepiride 2 mg BID Was on metformin, which was discontinued recently due to AKI Advised to follow diabetic diet On statin  F/u BMP and HbA1C Diabetic eye exam: Advised to follow up  with Ophthalmology for diabetic eye exam  On gabapentin 300 mg 3 times daily for diabetic neuropathy      Relevant Orders   Hemoglobin A1c   Subclinical hypothyroidism    Lab Results  Component Value Date   TSH 6.190 (H) 05/15/2022  Recheck TSH and free T4 Asymptomatic currently      Relevant Orders   TSH + free T4     Genitourinary   Stage 4 chronic kidney disease ([place]) - Primary    Last BMP reviewed Followed by Nephrology Avoid nephrotoxic agents Advised to maintain adequate hydration         Other   Tobacco abuse    Smokes about 0.2 pack/day  Asked about quitting: confirms that he/she currently smokes cigarettes Advise to quit smoking: Educated about QUITTING to reduce the risk of cancer, cardio and cerebrovascular disease. Assess willingness: Unwilling to quit at this time, but is working on cutting back. Assist with counseling and pharmacotherapy: Counseled for 5 minutes and literature provided. Arrange for follow up: follow up in 3 months and continue to offer help.      Other Visit Diagnoses     Need for immunization against influenza       Relevant Orders   Flu Vaccine QUAD High Dose(Fluad) (Completed)       No orders of the defined types were placed in this encounter.   Follow-up: Return in about 4 months (around 01/18/2023) for DM.    Lindell Spar, MD

## 2022-09-18 NOTE — Assessment & Plan Note (Signed)
Last BMP reviewed Followed by Nephrology Avoid nephrotoxic agents Advised to maintain adequate hydration

## 2022-09-18 NOTE — Patient Instructions (Signed)
Please continue to take medications as prescribed.  Please follow low carb diet and ambulate as tolerated.  Please consider getting Shingrix vaccine at local pharmacy.

## 2022-09-20 ENCOUNTER — Telehealth: Payer: Self-pay | Admitting: Internal Medicine

## 2022-09-20 ENCOUNTER — Other Ambulatory Visit: Payer: Self-pay | Admitting: Internal Medicine

## 2022-09-20 DIAGNOSIS — E1142 Type 2 diabetes mellitus with diabetic polyneuropathy: Secondary | ICD-10-CM

## 2022-09-20 LAB — HEMOGLOBIN A1C
Est. average glucose Bld gHb Est-mCnc: 200 mg/dL
Hgb A1c MFr Bld: 8.6 % — ABNORMAL HIGH (ref 4.8–5.6)

## 2022-09-20 LAB — TSH+FREE T4
Free T4: 1.09 ng/dL (ref 0.82–1.77)
TSH: 7.68 u[IU]/mL — ABNORMAL HIGH (ref 0.450–4.500)

## 2022-09-20 MED ORDER — GLIMEPIRIDE 4 MG PO TABS: 4.0000 mg | ORAL_TABLET | Freq: Two times a day (BID) | ORAL | 3 refills | Status: DC

## 2022-09-20 NOTE — Telephone Encounter (Signed)
Patient returning lab results call please call back on his cell phone.

## 2022-09-20 NOTE — Telephone Encounter (Signed)
Spoke to patient

## 2022-09-29 DIAGNOSIS — H52209 Unspecified astigmatism, unspecified eye: Secondary | ICD-10-CM | POA: Diagnosis not present

## 2022-09-29 DIAGNOSIS — H5213 Myopia, bilateral: Secondary | ICD-10-CM | POA: Diagnosis not present

## 2022-09-29 DIAGNOSIS — H524 Presbyopia: Secondary | ICD-10-CM | POA: Diagnosis not present

## 2022-10-12 ENCOUNTER — Other Ambulatory Visit: Payer: Self-pay | Admitting: Internal Medicine

## 2022-10-23 ENCOUNTER — Other Ambulatory Visit: Payer: Self-pay | Admitting: Internal Medicine

## 2022-10-23 DIAGNOSIS — E782 Mixed hyperlipidemia: Secondary | ICD-10-CM

## 2022-11-13 ENCOUNTER — Other Ambulatory Visit: Payer: Self-pay | Admitting: Internal Medicine

## 2022-11-14 DIAGNOSIS — N170 Acute kidney failure with tubular necrosis: Secondary | ICD-10-CM | POA: Diagnosis not present

## 2022-11-14 DIAGNOSIS — D638 Anemia in other chronic diseases classified elsewhere: Secondary | ICD-10-CM | POA: Diagnosis not present

## 2022-11-14 DIAGNOSIS — E211 Secondary hyperparathyroidism, not elsewhere classified: Secondary | ICD-10-CM | POA: Diagnosis not present

## 2022-11-14 DIAGNOSIS — I129 Hypertensive chronic kidney disease with stage 1 through stage 4 chronic kidney disease, or unspecified chronic kidney disease: Secondary | ICD-10-CM | POA: Diagnosis not present

## 2022-11-14 DIAGNOSIS — R809 Proteinuria, unspecified: Secondary | ICD-10-CM | POA: Diagnosis not present

## 2022-11-14 DIAGNOSIS — E1129 Type 2 diabetes mellitus with other diabetic kidney complication: Secondary | ICD-10-CM | POA: Diagnosis not present

## 2022-11-14 DIAGNOSIS — E1122 Type 2 diabetes mellitus with diabetic chronic kidney disease: Secondary | ICD-10-CM | POA: Diagnosis not present

## 2022-11-14 DIAGNOSIS — N189 Chronic kidney disease, unspecified: Secondary | ICD-10-CM | POA: Diagnosis not present

## 2022-11-21 DIAGNOSIS — R809 Proteinuria, unspecified: Secondary | ICD-10-CM | POA: Diagnosis not present

## 2022-11-21 DIAGNOSIS — N189 Chronic kidney disease, unspecified: Secondary | ICD-10-CM | POA: Diagnosis not present

## 2022-11-21 DIAGNOSIS — I129 Hypertensive chronic kidney disease with stage 1 through stage 4 chronic kidney disease, or unspecified chronic kidney disease: Secondary | ICD-10-CM | POA: Diagnosis not present

## 2022-11-21 DIAGNOSIS — E211 Secondary hyperparathyroidism, not elsewhere classified: Secondary | ICD-10-CM | POA: Diagnosis not present

## 2022-11-21 DIAGNOSIS — D638 Anemia in other chronic diseases classified elsewhere: Secondary | ICD-10-CM | POA: Diagnosis not present

## 2022-11-21 DIAGNOSIS — E1122 Type 2 diabetes mellitus with diabetic chronic kidney disease: Secondary | ICD-10-CM | POA: Diagnosis not present

## 2022-11-21 DIAGNOSIS — E1129 Type 2 diabetes mellitus with other diabetic kidney complication: Secondary | ICD-10-CM | POA: Diagnosis not present

## 2022-12-06 DIAGNOSIS — D638 Anemia in other chronic diseases classified elsewhere: Secondary | ICD-10-CM | POA: Diagnosis not present

## 2022-12-06 DIAGNOSIS — E1122 Type 2 diabetes mellitus with diabetic chronic kidney disease: Secondary | ICD-10-CM | POA: Diagnosis not present

## 2022-12-06 DIAGNOSIS — E1129 Type 2 diabetes mellitus with other diabetic kidney complication: Secondary | ICD-10-CM | POA: Diagnosis not present

## 2022-12-06 DIAGNOSIS — N189 Chronic kidney disease, unspecified: Secondary | ICD-10-CM | POA: Diagnosis not present

## 2022-12-06 DIAGNOSIS — E211 Secondary hyperparathyroidism, not elsewhere classified: Secondary | ICD-10-CM | POA: Diagnosis not present

## 2022-12-06 DIAGNOSIS — R809 Proteinuria, unspecified: Secondary | ICD-10-CM | POA: Diagnosis not present

## 2022-12-06 DIAGNOSIS — I129 Hypertensive chronic kidney disease with stage 1 through stage 4 chronic kidney disease, or unspecified chronic kidney disease: Secondary | ICD-10-CM | POA: Diagnosis not present

## 2022-12-07 ENCOUNTER — Other Ambulatory Visit: Payer: Self-pay | Admitting: Internal Medicine

## 2022-12-07 DIAGNOSIS — G63 Polyneuropathy in diseases classified elsewhere: Secondary | ICD-10-CM

## 2022-12-18 ENCOUNTER — Other Ambulatory Visit: Payer: Self-pay | Admitting: Internal Medicine

## 2023-01-18 ENCOUNTER — Encounter: Payer: Self-pay | Admitting: Internal Medicine

## 2023-01-18 ENCOUNTER — Ambulatory Visit (INDEPENDENT_AMBULATORY_CARE_PROVIDER_SITE_OTHER): Payer: Medicare HMO | Admitting: Internal Medicine

## 2023-01-18 VITALS — BP 119/76 | HR 80 | Ht 67.0 in | Wt 195.0 lb

## 2023-01-18 DIAGNOSIS — N184 Chronic kidney disease, stage 4 (severe): Secondary | ICD-10-CM | POA: Diagnosis not present

## 2023-01-18 DIAGNOSIS — E038 Other specified hypothyroidism: Secondary | ICD-10-CM | POA: Diagnosis not present

## 2023-01-18 DIAGNOSIS — E1142 Type 2 diabetes mellitus with diabetic polyneuropathy: Secondary | ICD-10-CM

## 2023-01-18 DIAGNOSIS — E782 Mixed hyperlipidemia: Secondary | ICD-10-CM | POA: Diagnosis not present

## 2023-01-18 DIAGNOSIS — J432 Centrilobular emphysema: Secondary | ICD-10-CM | POA: Diagnosis not present

## 2023-01-18 DIAGNOSIS — G250 Essential tremor: Secondary | ICD-10-CM | POA: Diagnosis not present

## 2023-01-18 DIAGNOSIS — I10 Essential (primary) hypertension: Secondary | ICD-10-CM

## 2023-01-18 DIAGNOSIS — E1140 Type 2 diabetes mellitus with diabetic neuropathy, unspecified: Secondary | ICD-10-CM | POA: Diagnosis not present

## 2023-01-18 DIAGNOSIS — Z8673 Personal history of transient ischemic attack (TIA), and cerebral infarction without residual deficits: Secondary | ICD-10-CM

## 2023-01-18 DIAGNOSIS — I700 Atherosclerosis of aorta: Secondary | ICD-10-CM

## 2023-01-18 MED ORDER — OZEMPIC (0.25 OR 0.5 MG/DOSE) 2 MG/3ML ~~LOC~~ SOPN: PEN_INJECTOR | SUBCUTANEOUS | 1 refills | Status: AC

## 2023-01-18 MED ORDER — ATORVASTATIN CALCIUM 40 MG PO TABS: 40.0000 mg | ORAL_TABLET | Freq: Every day | ORAL | 3 refills | Status: DC

## 2023-01-18 NOTE — Assessment & Plan Note (Signed)
Lab Results  Component Value Date   HGBA1C 8.6 (H) 09/18/2022   Uncontrolled due to diet noncompliance On Glimepiride 2 mg BID - continue for now Added Ozempic, increase dose as tolerated Was on metformin, which was discontinued recently due to AKI Advised to follow diabetic diet, diet material provided On statin  F/u BMP and HbA1C Diabetic eye exam: Advised to follow up with Ophthalmology for diabetic eye exam  On gabapentin 300 mg 3 times daily for diabetic neuropathy

## 2023-01-18 NOTE — Patient Instructions (Signed)
Please start taking Ozempic 0.25 mg once weekly for 1 month and then increase dose to 0.50 mg.  Please continue taking Glimepiride 2 mg twice daily.  Please continue to take medications as prescribed.  Please continue to follow low carb diet and perform moderate exercise/walking at least 150 mins/week.  Please get fasting blood tests done before the next visit.

## 2023-01-18 NOTE — Assessment & Plan Note (Signed)
History of thalamic stroke in 2020, no residual deficit currently except mild right hand tremor On aspirin and statin Check lipid profile

## 2023-01-18 NOTE — Assessment & Plan Note (Signed)
Last BMP reviewed Followed by Nephrology Avoid nephrotoxic agents Advised to maintain adequate hydration 

## 2023-01-18 NOTE — Assessment & Plan Note (Signed)
Has right hand tremor, residual effect from previous stroke He needs to cut down caffeine intake as well

## 2023-01-18 NOTE — Assessment & Plan Note (Signed)
Noted on previous CT chest On aspirin and statin

## 2023-01-18 NOTE — Assessment & Plan Note (Signed)
On statin.

## 2023-01-18 NOTE — Progress Notes (Signed)
Established Patient Office Visit  Subjective:  Patient ID: Collin Yoder, male    DOB: November 08, 1950  Age: 72 y.o. MRN: [id number]  CC:  Chief Complaint  Patient presents with   Diabetes    Four month follow up. Discuss diet options    HPI Collin Yoder is a 72 y.o. male with past medical history of CVA, HTN, COPD, type II DM with neuropathy and tobacco abuse who presents for f/u of his chronic medical conditions.  HTN: BP is well-controlled. Takes medications regularly. Patient denies headache, dizziness, chest pain, dyspnea or palpitations.   Type 2 DM: His last HbA1C was 8.6 in 12/23. He is on Glimepiride 2 mg BID currently.  His dose of glimepiride was recently increased to 4 mg, but his pharmacy has been dispensing old dose of 2 mg.  He admits that he has not been following low-carb diet recently.  His blood close has been elevated, mostly above 150, some times around 190.  He also takes Gabapentin for DM neuropathy. He states that he had balance problem due to neuropathy, but has been better since increasing dose of Gabapentin.  CKD: He sees Nephrologist. His last BMP showed GFR of 29. Denies any dysuria, hematuria or urinary hesitancy/resistance.  Subclinical hypothyroidism: His TSH was elevated in 12/23 with free T4 WNL.  He denies any recent change in weight or appetite.     Past Medical History:  Diagnosis Date   Borderline diabetes    Peripheral neuropathy 07/15/2019   Right lower lobe lung mass 04/11/2017   Thalamic stroke 07/15/2019   Type II diabetes mellitus, uncontrolled 07/15/2019   Vitamin D deficiency disease 07/15/2019    Past Surgical History:  Procedure Laterality Date   APPENDECTOMY     CATARACT EXTRACTION, BILATERAL Bilateral 01/2021    Family History  Problem Relation Age of Onset   Diabetes Mother 81   Stroke Mother    Colon cancer Neg Hx     Social History   Socioeconomic History   Marital status: Single    Spouse name: Not on file    Number of children: Not on file   Years of education: Not on file   Highest education level: Not on file  Occupational History   Not on file  Tobacco Use   Smoking status: Every Day    Packs/day: 1.00    Years: 45.00    Additional pack years: 0.00    Total pack years: 45.00    Types: Cigarettes   Smokeless tobacco: Never  Vaping Use   Vaping Use: Never used  Substance and Sexual Activity   Alcohol use: Yes    Alcohol/week: 1.0 standard drink of alcohol    Types: 1 Cans of beer per week    Comment: every 3 days   Drug use: No   Sexual activity: Not on file  Other Topics Concern   Not on file  Social History Narrative   Single.Lives alone in a motel.Works at [employer].   Social Determinants of Health   Financial Resource Strain: Low Risk  (03/15/2022)   Overall Financial Resource Strain (CARDIA)    Difficulty of Paying Living Expenses: Not hard at all  Food Insecurity: No Food Insecurity (03/15/2022)   Hunger Vital Sign    Worried About Running Out of Food in the Last Year: Never true    Ran Out of Food in the Last Year: Never true  Transportation Needs: No Transportation Needs (03/15/2022)   PRAPARE - Transportation  Lack of Transportation (Medical): No    Lack of Transportation (Non-Medical): No  Physical Activity: Sufficiently Active (03/15/2022)   Exercise Vital Sign    Days of Exercise per Week: 7 days    Minutes of Exercise per Session: 50 min  Stress: No Stress Concern Present (03/15/2022)   [employer] of Occupational Health - Occupational Stress Questionnaire    Feeling of Stress : Not at all  Social Connections: Socially Isolated (03/15/2022)   Social Connection and Isolation Panel [NHANES]    Frequency of Communication with Friends and Family: More than three times a week    Frequency of Social Gatherings with Friends and Family: More than three times a week    Attends Religious Services: Never    Database administrator or Organizations: No     Attends Banker Meetings: Never    Marital Status: Divorced  Catering manager Violence: Not At Risk (03/15/2022)   Humiliation, Afraid, Rape, and Kick questionnaire    Fear of Current or Ex-Partner: No    Emotionally Abused: No    Physically Abused: No    Sexually Abused: No    Outpatient Medications Prior to Visit  Medication Sig Dispense Refill   olmesartan (BENICAR) 5 MG tablet Take 5 mg by mouth daily.     acetaminophen (TYLENOL) 325 MG tablet Take 2 tablets (650 mg total) by mouth every 6 (six) hours as needed for mild pain (or Fever >/= 101). 12 tablet 0   Alcohol Swabs (B-D SINGLE USE SWABS REGULAR) PADS 1 Units by Does not apply route daily at 12 noon. 100 each 1   amLODipine (NORVASC) 10 MG tablet Take 1 tablet (10 mg total) by mouth daily. 90 tablet 3   Blood Glucose Monitoring Suppl (TRUE METRIX METER) w/Device KIT USE AS DIRECTED 1 kit 0   Cholecalciferol (VITAMIN D3) 5000 units CAPS Take 1 capsule by mouth daily.     gabapentin (NEURONTIN) 300 MG capsule TAKE 1 CAPSULE BY MOUTH (3) TIMES DAILY. 270 capsule 0   glimepiride (AMARYL) 2 MG tablet TAKE 1 TABLET BY MOUTH TWICE DAILY BEFORE A MEAL 60 tablet 0   glucose blood (TRUE METRIX BLOOD GLUCOSE TEST) test strip Use as instructed 100 each 12   TRUEplus Lancets 33G MISC USE TO TEST DAILY AT 12 NOON. 100 each 10   atorvastatin (LIPITOR) 40 MG tablet TAKE ONE TABLET BY MOUTH ONCE DAILY. 90 tablet 0   glimepiride (AMARYL) 4 MG tablet Take 1 tablet (4 mg total) by mouth 2 (two) times daily before a meal. 60 tablet 3   No facility-administered medications prior to visit.    No Known Allergies  ROS Review of Systems  Constitutional:  Negative for chills and fever.  HENT:  Negative for congestion and sore throat.   Eyes:  Negative for pain and discharge.  Respiratory:  Negative for cough and shortness of breath.   Cardiovascular:  Negative for chest pain and palpitations.  Gastrointestinal:  Negative for diarrhea,  nausea and vomiting.  Endocrine: Negative for polydipsia and polyuria.  Genitourinary:  Negative for dysuria and hematuria.  Musculoskeletal:  Negative for neck pain and neck stiffness.  Skin:  Negative for rash.  Neurological:  Positive for tremors, weakness and numbness. Negative for dizziness and headaches.  Psychiatric/Behavioral:  Negative for agitation and behavioral problems.       Objective:    Physical Exam Vitals reviewed.  Constitutional:      General: He is not in acute distress.  Appearance: He is not diaphoretic.  HENT:     Head: Normocephalic and atraumatic.     Nose: Nose normal.     Mouth/Throat:     Mouth: Mucous membranes are moist.  Eyes:     General: No scleral icterus.    Extraocular Movements: Extraocular movements intact.  Cardiovascular:     Rate and Rhythm: Normal rate and regular rhythm.     Heart sounds: Normal heart sounds. No murmur heard. Pulmonary:     Breath sounds: Normal breath sounds. No wheezing or rales.  Abdominal:     Palpations: Abdomen is soft.     Tenderness: There is no abdominal tenderness.  Musculoskeletal:     Cervical back: Neck supple. No tenderness.     Right lower leg: No edema.     Left lower leg: No edema.  Skin:    General: Skin is warm.     Findings: No rash.  Neurological:     General: No focal deficit present.     Mental Status: He is alert and oriented to person, place, and time.     Sensory: Sensory deficit (B/l feet) present.     Motor: Weakness (B/l LE - 4/5) present.     Comments: Resting tremors of right hand  Psychiatric:        Mood and Affect: Mood normal.        Behavior: Behavior normal.     BP 119/76 (BP Location: Right Arm, Patient Position: Sitting, Cuff Size: Normal)   Pulse 80   Ht  (1.702 m)   Wt 195 lb (88.5 kg)   SpO2 92%   BMI 30.54 kg/m  Wt Readings from Last 3 Encounters:  01/18/23 195 lb (88.5 kg)  09/18/22 193 lb 9.6 oz (87.8 kg)  05/15/22 194 lb (88 kg)    Lab  Results  Component Value Date   TSH 7.680 (H) 09/18/2022   Lab Results  Component Value Date   WBC 6.5 05/15/2022   HGB 10.7 (L) 05/15/2022   HCT 31.6 (L) 05/15/2022   MCV 91 05/15/2022   PLT 207 05/15/2022   Lab Results  Component Value Date   NA 143 05/15/2022   K 4.3 05/15/2022   CO2 22 05/15/2022   GLUCOSE 84 05/15/2022   BUN 31 (H) 05/15/2022   CREATININE 2.23 (H) 05/15/2022   BILITOT 0.4 05/15/2022   ALKPHOS 87 05/15/2022   AST 12 05/15/2022   ALT 10 05/15/2022   PROT 7.3 05/15/2022   ALBUMIN 4.6 05/15/2022   CALCIUM 9.0 05/15/2022   ANIONGAP 9 08/07/2021   EGFR 31 (L) 05/15/2022   Lab Results  Component Value Date   CHOL 99 (L) 05/15/2022   Lab Results  Component Value Date   HDL 30 (L) 05/15/2022   Lab Results  Component Value Date   LDLCALC 50 05/15/2022   Lab Results  Component Value Date   TRIG 97 05/15/2022   Lab Results  Component Value Date   CHOLHDL 3.3 05/15/2022   Lab Results  Component Value Date   HGBA1C 8.6 (H) 09/18/2022      Assessment & Plan:   Problem List Items Addressed This Visit       Cardiovascular and Mediastinum   Hypertension    BP Readings from Last 1 Encounters:  01/18/23 119/76  Well-controlled with Amlodipine Was on Losartan, was switched to Amlodipine recently due to AKI Counseled for compliance with the medications Advised DASH diet and moderate exercise/walking as tolerated  Relevant Medications   olmesartan (BENICAR) 5 MG tablet   atorvastatin (LIPITOR) 40 MG tablet   Aortic atherosclerosis    Noted on previous CT chest On aspirin and statin      Relevant Medications   olmesartan (BENICAR) 5 MG tablet   atorvastatin (LIPITOR) 40 MG tablet     Endocrine   Diabetes mellitus - Primary    Lab Results  Component Value Date   HGBA1C 8.6 (H) 09/18/2022  Uncontrolled due to diet noncompliance On Glimepiride 2 mg BID - continue for now Added Ozempic, increase dose as tolerated Was on  metformin, which was discontinued recently due to AKI Advised to follow diabetic diet, diet material provided On statin  F/u BMP and HbA1C Diabetic eye exam: Advised to follow up with Ophthalmology for diabetic eye exam  On gabapentin 300 mg 3 times daily for diabetic neuropathy      Relevant Medications   olmesartan (BENICAR) 5 MG tablet   Semaglutide,0.25 or 0.5MG /DOS, (OZEMPIC, 0.25 OR 0.5 MG/DOSE,) 2 MG/3ML SOPN   atorvastatin (LIPITOR) 40 MG tablet   Other Relevant Orders   Hemoglobin A1c   Subclinical hypothyroidism    Lab Results  Component Value Date   TSH 7.680 (H) 09/18/2022  Recheck TSH and free T4 Asymptomatic currently      Relevant Orders   TSH + free T4     Nervous and Auditory   Essential tremor    Has right hand tremor, residual effect from previous stroke He needs to cut down caffeine intake as well        Genitourinary   Stage 4 chronic kidney disease    Last BMP reviewed Followed by Nephrology Avoid nephrotoxic agents Advised to maintain adequate hydration        Other   Hyperlipidemia    On statin      Relevant Medications   olmesartan (BENICAR) 5 MG tablet   atorvastatin (LIPITOR) 40 MG tablet   Other Relevant Orders   Lipid Profile   History of CVA (cerebrovascular accident)    History of thalamic stroke in 2020, no residual deficit currently except mild right hand tremor On aspirin and statin Check lipid profile      Meds ordered this encounter  Medications   Semaglutide,0.25 or 0.5MG /DOS, (OZEMPIC, 0.25 OR 0.5 MG/DOSE,) 2 MG/3ML SOPN    Sig: Inject 0.25 mg into the skin every 7 (seven) days for 28 days, THEN 0.5 mg every 7 (seven) days for 28 days.    Dispense:  3 mL    Refill:  1   atorvastatin (LIPITOR) 40 MG tablet    Sig: Take 1 tablet (40 mg total) by mouth daily.    Dispense:  90 tablet    Refill:  3    Follow-up: Return in about 4 months (around 05/20/2023) for Annual physical.    Anabel Halon, MD

## 2023-01-18 NOTE — Assessment & Plan Note (Signed)
BP Readings from Last 1 Encounters:  01/18/23 119/76   Well-controlled with Amlodipine Was on Losartan, was switched to Amlodipine recently due to AKI Counseled for compliance with the medications Advised DASH diet and moderate exercise/walking as tolerated

## 2023-01-18 NOTE — Assessment & Plan Note (Signed)
Lab Results  Component Value Date   TSH 7.680 (H) 09/18/2022   Recheck TSH and free T4 Asymptomatic currently

## 2023-01-19 ENCOUNTER — Telehealth: Payer: Self-pay | Admitting: Internal Medicine

## 2023-01-19 NOTE — Telephone Encounter (Signed)
Patient called need med refill and asking it should be 4 mg.    glimepiride (AMARYL) 4 MG tablet [[id number]]   Pharmacy  [facility] - [place], [place] - [address], [place] [address] Phone: [phone]  Fax: [phone]

## 2023-01-19 NOTE — Telephone Encounter (Signed)
LVM

## 2023-01-24 DIAGNOSIS — I129 Hypertensive chronic kidney disease with stage 1 through stage 4 chronic kidney disease, or unspecified chronic kidney disease: Secondary | ICD-10-CM | POA: Diagnosis not present

## 2023-01-24 DIAGNOSIS — E211 Secondary hyperparathyroidism, not elsewhere classified: Secondary | ICD-10-CM | POA: Diagnosis not present

## 2023-01-24 DIAGNOSIS — R809 Proteinuria, unspecified: Secondary | ICD-10-CM | POA: Diagnosis not present

## 2023-01-24 DIAGNOSIS — D638 Anemia in other chronic diseases classified elsewhere: Secondary | ICD-10-CM | POA: Diagnosis not present

## 2023-01-24 DIAGNOSIS — N189 Chronic kidney disease, unspecified: Secondary | ICD-10-CM | POA: Diagnosis not present

## 2023-01-24 DIAGNOSIS — E1122 Type 2 diabetes mellitus with diabetic chronic kidney disease: Secondary | ICD-10-CM | POA: Diagnosis not present

## 2023-01-24 DIAGNOSIS — E1129 Type 2 diabetes mellitus with other diabetic kidney complication: Secondary | ICD-10-CM | POA: Diagnosis not present

## 2023-01-25 LAB — PROTEIN / CREATININE RATIO, URINE: Creatinine, Urine: 109

## 2023-03-21 ENCOUNTER — Other Ambulatory Visit: Payer: Self-pay | Admitting: Internal Medicine

## 2023-03-21 DIAGNOSIS — G63 Polyneuropathy in diseases classified elsewhere: Secondary | ICD-10-CM

## 2023-03-27 ENCOUNTER — Ambulatory Visit (INDEPENDENT_AMBULATORY_CARE_PROVIDER_SITE_OTHER): Payer: Medicare HMO | Admitting: Internal Medicine

## 2023-03-27 ENCOUNTER — Encounter: Payer: Self-pay | Admitting: Internal Medicine

## 2023-03-27 VITALS — BP 103/63 | HR 87 | Resp 16 | Ht 67.25 in | Wt 192.0 lb

## 2023-03-27 DIAGNOSIS — Z0000 Encounter for general adult medical examination without abnormal findings: Secondary | ICD-10-CM

## 2023-03-27 MED ORDER — OZEMPIC (0.25 OR 0.5 MG/DOSE) 2 MG/3ML ~~LOC~~ SOPN
0.2500 mg | PEN_INJECTOR | SUBCUTANEOUS | 0 refills | Status: DC
Start: 2023-03-27 — End: 2023-04-16

## 2023-03-27 NOTE — Progress Notes (Signed)
Subjective:   Collin Yoder is a 72 y.o. male who presents for Medicare Annual/Subsequent preventive examination.  Visit Complete: In person  Patient Medicare AWV questionnaire was not completed by the patient.  Review of Systems    Review of Systems  All other systems reviewed and are negative.   Objective:    Today's Vitals   03/27/23 1328  BP: 103/63  Pulse: 87  Resp: 16  SpO2: 90%  Weight: 192 lb (87.1 kg)  Height: 5' 7.25" (1.708 m)   Body mass index is 29.85 kg/m.     03/15/2022   11:19 AM 08/04/2021    1:44 PM 08/03/2021    7:19 AM 08/02/2021    5:42 PM 01/06/2021    2:55 PM 01/27/2019    1:13 PM 04/11/2017   11:16 AM  Advanced Directives  Does Patient Have a Medical Advance Directive? No No No No No No No  Would patient like information on creating a medical advance directive? No - Patient declined No - Patient declined  No - Patient declined No - Patient declined No - Patient declined     Current Medications (verified) Outpatient Encounter Medications as of 03/27/2023  Medication Sig   acetaminophen (TYLENOL) 325 MG tablet Take 2 tablets (650 mg total) by mouth every 6 (six) hours as needed for mild pain (or Fever >/= 101).   Alcohol Swabs (B-D SINGLE USE SWABS REGULAR) PADS 1 Units by Does not apply route daily at 12 noon.   amLODipine (NORVASC) 10 MG tablet Take 1 tablet (10 mg total) by mouth daily.   atorvastatin (LIPITOR) 40 MG tablet Take 1 tablet (40 mg total) by mouth daily.   Blood Glucose Monitoring Suppl (TRUE METRIX METER) w/Device KIT USE AS DIRECTED   Cholecalciferol (VITAMIN D3) 5000 units CAPS Take 1 capsule by mouth daily.   gabapentin (NEURONTIN) 300 MG capsule TAKE 1 CAPSULE BY MOUTH (3) TIMES DAILY.   glimepiride (AMARYL) 2 MG tablet TAKE 1 TABLET BY MOUTH TWICE DAILY BEFORE A MEAL   glucose blood (TRUE METRIX BLOOD GLUCOSE TEST) test strip Use as instructed   olmesartan (BENICAR) 5 MG tablet Take 5 mg by mouth daily.   Semaglutide,0.25  or 0.5MG /DOS, (OZEMPIC, 0.25 OR 0.5 MG/DOSE,) 2 MG/3ML SOPN Inject 0.25 mg into the skin once a week.   TRUEplus Lancets 33G MISC USE TO TEST DAILY AT 12 NOON.   No facility-administered encounter medications on file as of 03/27/2023.    Allergies (verified) Patient has no known allergies.   History: Past Medical History:  Diagnosis Date   Borderline diabetes    Peripheral neuropathy 07/15/2019   Right lower lobe lung mass 04/11/2017   Thalamic stroke (HCC) 07/15/2019   Type II diabetes mellitus, uncontrolled 07/15/2019   Vitamin D deficiency disease 07/15/2019   Past Surgical History:  Procedure Laterality Date   APPENDECTOMY     CATARACT EXTRACTION, BILATERAL Bilateral 01/2021   Family History  Problem Relation Age of Onset   Diabetes Mother 82   Stroke Mother    Colon cancer Neg Hx    Social History   Socioeconomic History   Marital status: Single    Spouse name: Not on file   Number of children: Not on file   Years of education: Not on file   Highest education level: Not on file  Occupational History   Not on file  Tobacco Use   Smoking status: Every Day    Packs/day: 1.00    Years: 45.00  Additional pack years: 0.00    Total pack years: 45.00    Types: Cigarettes   Smokeless tobacco: Never   Tobacco comments:    Down to 1/2 ppd  Vaping Use   Vaping Use: Never used  Substance and Sexual Activity   Alcohol use: Yes    Alcohol/week: 1.0 standard drink of alcohol    Types: 1 Cans of beer per week    Comment: every 3 days   Drug use: No   Sexual activity: Not on file  Other Topics Concern   Not on file  Social History Narrative   Single.Lives alone in a motel.Works at [employer].   Social Determinants of Health   Financial Resource Strain: Low Risk  (03/15/2022)   Overall Financial Resource Strain (CARDIA)    Difficulty of Paying Living Expenses: Not hard at all  Food Insecurity: No Food Insecurity (03/15/2022)   Hunger Vital Sign    Worried  About Running Out of Food in the Last Year: Never true    Ran Out of Food in the Last Year: Never true  Transportation Needs: No Transportation Needs (03/15/2022)   PRAPARE - Administrator, Civil Service (Medical): No    Lack of Transportation (Non-Medical): No  Physical Activity: Sufficiently Active (03/15/2022)   Exercise Vital Sign    Days of Exercise per Week: 7 days    Minutes of Exercise per Session: 50 min  Stress: No Stress Concern Present (03/15/2022)   [employer] of Occupational Health - Occupational Stress Questionnaire    Feeling of Stress : Not at all  Social Connections: Socially Isolated (03/15/2022)   Social Connection and Isolation Panel [NHANES]    Frequency of Communication with Yoder and Family: More than three times a week    Frequency of Social Gatherings with Yoder and Family: More than three times a week    Attends Religious Services: Never    Database administrator or Organizations: No    Attends Engineer, structural: Never    Marital Status: Divorced    Tobacco Counseling Ready to quit: No Counseling given: Yes Tobacco comments: Down to 1/2 ppd   Clinical Intake:  Pre-visit preparation completed: Yes  Pain : No/denies pain     Diabetes: Yes CBG done?: No Did pt. bring in CBG monitor from home?: No  How often do you need to have someone help you when you read instructions, pamphlets, or other written materials from your doctor or pharmacy?: 1 - Never What is the last grade level you completed in school?: 12th grade  Interpreter Needed?: No      Activities of Daily Living    03/27/2023    1:26 PM  In your present state of health, do you have any difficulty performing the following activities:  Hearing? 0  Vision? 0  Difficulty concentrating or making decisions? 0  Walking or climbing stairs? 0  Dressing or bathing? 0  Doing errands, shopping? 0    Patient Care Team: Collin Halon, MD as PCP - General  (Internal Medicine) Collin Gauss Gerrit Friends, MD as Consulting Physician (Gastroenterology) [facility], [facility]  Indicate any recent Medical Services you may have received from other than [facility] providers in the past year (date may be approximate).     Assessment:   This is a routine wellness examination for Collin Yoder.  Hearing/Vision screen No results found.  Dietary issues and exercise activities discussed:     Goals Addressed  This Visit's Progress    Patient Stated       Smoking partial cigarettes. 1/2 ppd . Down from 2 ppd.  Keep up the good work.  Goal to quit       Depression Screen    03/27/2023    1:32 PM 01/18/2023    1:40 PM 09/18/2022    3:43 PM 08/09/2022   11:43 AM 05/15/2022    3:15 PM 03/15/2022   11:20 AM 03/15/2022   11:18 AM  PHQ 2/9 Scores  PHQ - 2 Score 0 0 0 0 0 0 0    Fall Risk    03/27/2023    1:32 PM 03/27/2023    1:28 PM 01/18/2023    1:40 PM 09/18/2022    3:42 PM 08/09/2022   11:43 AM  Fall Risk   Falls in the past year? 0 0 0 0 0  Number falls in past yr:  0 0 0 0  Injury with Fall?  0 0 0 0  Risk for fall due to :     No Fall Risks  Follow up     Falls evaluation completed    MEDICARE RISK AT HOME:  Medicare Risk at Home - 03/27/23 1332     Any stairs in or around the home? No    If so, are there any without handrails? No    Home free of loose throw rugs in walkways, pet beds, electrical cords, etc? Yes    Adequate lighting in your home to reduce risk of falls? Yes    Life alert? No    Use of a cane, walker or w/c? No    Grab bars in the bathroom? Yes    Shower chair or bench in shower? No    Elevated toilet seat or a handicapped toilet? No             TIMED UP AND GO:  Was the test performed?  No    Cognitive Function:    03/15/2022   11:21 AM  MMSE - Mini Mental State Exam  Not completed: Unable to complete        03/27/2023    1:33 PM 03/15/2022   11:21 AM 01/06/2021    2:56 PM  6CIT Screen  What Year? 0  points 0 points 0 points  What month? 0 points 0 points 0 points  What time? 0 points 0 points 0 points  Count back from 20 0 points 0 points 0 points  Months in reverse 2 points 0 points 0 points  Repeat phrase 2 points 2 points 4 points  Total Score 4 points 2 points 4 points    Immunizations Immunization History  Administered Date(s) Administered   Fluad Quad(high Dose 65+) 07/15/2019, 08/03/2020, 09/01/2021, 09/18/2022   Pneumococcal Conjugate-13 07/26/2017   Pneumococcal Polysaccharide-23 07/15/2019   Td 10/02/2013    TDAP status: Up to date  Flu Vaccine status: Up to date  Pneumococcal vaccine status: Up to date  Covid-19 vaccine status: Information provided on how to obtain vaccines.   Qualifies for Shingles Vaccine? Yes   Zostavax completed No   Shingrix Completed?: No.    Education has been provided regarding the importance of this vaccine. Patient has been advised to call insurance company to determine out of pocket expense if they have not yet received this vaccine. Advised may also receive vaccine at local pharmacy or Health Dept. Verbalized acceptance and understanding.  Screening Tests Health Maintenance  Topic Date Due  COVID-19 Vaccine (1) Never done   Zoster Vaccines- Shingrix (1 of 2) Never done   OPHTHALMOLOGY EXAM  10/04/2022   Lung Cancer Screening  11/22/2022   FOOT EXAM  01/11/2023   Diabetic kidney evaluation - Urine ACR  01/12/2023   Medicare Annual Wellness (AWV)  03/16/2023   HEMOGLOBIN A1C  03/20/2023   INFLUENZA VACCINE  05/03/2023   Diabetic kidney evaluation - eGFR measurement  05/16/2023   DTaP/Tdap/Td (2 - Tdap) 10/03/2023   Fecal DNA (Cologuard)  10/21/2024   Pneumonia Vaccine 28+ Years old  Completed   Hepatitis C Screening  Completed   HPV VACCINES  Aged Out    Health Maintenance  Health Maintenance Due  Topic Date Due   COVID-19 Vaccine (1) Never done   Zoster Vaccines- Shingrix (1 of 2) Never done   OPHTHALMOLOGY EXAM   10/04/2022   Lung Cancer Screening  11/22/2022   FOOT EXAM  01/11/2023   Diabetic kidney evaluation - Urine ACR  01/12/2023   Medicare Annual Wellness (AWV)  03/16/2023   HEMOGLOBIN A1C  03/20/2023    Colorectal cancer screening: Type of screening: Cologuard. Completed 10/21/2021. Repeat every 3 years  Lung Cancer Screening: (Low Dose CT Chest recommended if Age 73-80 years, 20 pack-year currently smoking OR have quit w/in 15years.) does qualify.   Lung Cancer Screening Referral: Patient will follow up with PCP at visit in August  Additional Screening:  Hepatitis C Screening: does not qualify; Completed 07/15/2019  Vision Screening: Recommended annual ophthalmology exams for early detection of glaucoma and other disorders of the eye. Is the patient up to date with their annual eye exam?  Yes  Who is the provider or what is the name of the office in which the patient attends annual eye exams? [facility] If pt is not established with a provider, would they like to be referred to a provider to establish care? No .   Dental Screening: Recommended annual dental exams for proper oral hygiene  Diabetic Foot Exam: Diabetic Foot Exam: Completed 01/10/2022, needs one this year.   Community Resource Referral / Chronic Care Management: CRR required this visit?  No   CCM required this visit?  No     Plan:     I have personally reviewed and noted the following in the patient's chart:   Medical and social history Use of alcohol, tobacco or illicit drugs  Current medications and supplements including opioid prescriptions. Patient is not currently taking opioid prescriptions. Functional ability and status Nutritional status Physical activity Advanced directives List of other physicians Hospitalizations, surgeries, and ER visits in previous 12 months Vitals Screenings to include cognitive, depression, and falls Referrals and appointments  In addition, I have reviewed and discussed with  patient certain preventive protocols, quality metrics, and best practice recommendations. A written personalized care plan for preventive services as well as general preventive health recommendations were provided to patient.     Milus Banister, MD   03/27/2023

## 2023-03-27 NOTE — Patient Instructions (Signed)
  Collin Yoder , Thank you for taking time to come for your Medicare Wellness Visit. I appreciate your ongoing commitment to your health goals. Please review the following plan we discussed and let me know if I can assist you in the future.   These are the goals we discussed:  Goals      Patient Stated     Smoking partial cigarettes. 1/2 ppd . Down from 2 ppd.  Keep up the good work.  Goal to quit        This is a list of the screening recommended for you and due dates:  Health Maintenance  Topic Date Due   COVID-19 Vaccine (1) Never done   Zoster (Shingles) Vaccine (1 of 2) Never done   Eye exam for diabetics  10/04/2022   Screening for Lung Cancer  11/22/2022   Complete foot exam   01/11/2023   Yearly kidney health urinalysis for diabetes  01/12/2023   Medicare Annual Wellness Visit  03/16/2023   Hemoglobin A1C  03/20/2023   Flu Shot  05/03/2023   Yearly kidney function blood test for diabetes  05/16/2023   DTaP/Tdap/Td vaccine (2 - Tdap) 10/03/2023   Cologuard (Stool DNA test)  10/21/2024   Pneumonia Vaccine  Completed   Hepatitis C Screening  Completed   HPV Vaccine  Aged Out

## 2023-04-16 ENCOUNTER — Other Ambulatory Visit: Payer: Self-pay | Admitting: Internal Medicine

## 2023-04-16 DIAGNOSIS — E1142 Type 2 diabetes mellitus with diabetic polyneuropathy: Secondary | ICD-10-CM

## 2023-04-16 DIAGNOSIS — E1122 Type 2 diabetes mellitus with diabetic chronic kidney disease: Secondary | ICD-10-CM | POA: Diagnosis not present

## 2023-04-16 DIAGNOSIS — N2581 Secondary hyperparathyroidism of renal origin: Secondary | ICD-10-CM | POA: Diagnosis not present

## 2023-04-16 DIAGNOSIS — I129 Hypertensive chronic kidney disease with stage 1 through stage 4 chronic kidney disease, or unspecified chronic kidney disease: Secondary | ICD-10-CM | POA: Diagnosis not present

## 2023-04-16 DIAGNOSIS — R809 Proteinuria, unspecified: Secondary | ICD-10-CM | POA: Diagnosis not present

## 2023-04-23 DIAGNOSIS — I129 Hypertensive chronic kidney disease with stage 1 through stage 4 chronic kidney disease, or unspecified chronic kidney disease: Secondary | ICD-10-CM | POA: Diagnosis not present

## 2023-04-23 DIAGNOSIS — D638 Anemia in other chronic diseases classified elsewhere: Secondary | ICD-10-CM | POA: Diagnosis not present

## 2023-04-23 DIAGNOSIS — R309 Painful micturition, unspecified: Secondary | ICD-10-CM | POA: Diagnosis not present

## 2023-04-23 DIAGNOSIS — E875 Hyperkalemia: Secondary | ICD-10-CM | POA: Diagnosis not present

## 2023-05-09 DIAGNOSIS — E1122 Type 2 diabetes mellitus with diabetic chronic kidney disease: Secondary | ICD-10-CM | POA: Diagnosis not present

## 2023-05-09 DIAGNOSIS — I129 Hypertensive chronic kidney disease with stage 1 through stage 4 chronic kidney disease, or unspecified chronic kidney disease: Secondary | ICD-10-CM | POA: Diagnosis not present

## 2023-05-09 DIAGNOSIS — R809 Proteinuria, unspecified: Secondary | ICD-10-CM | POA: Diagnosis not present

## 2023-05-09 DIAGNOSIS — D638 Anemia in other chronic diseases classified elsewhere: Secondary | ICD-10-CM | POA: Diagnosis not present

## 2023-05-09 DIAGNOSIS — N170 Acute kidney failure with tubular necrosis: Secondary | ICD-10-CM | POA: Diagnosis not present

## 2023-05-22 ENCOUNTER — Ambulatory Visit (INDEPENDENT_AMBULATORY_CARE_PROVIDER_SITE_OTHER): Payer: Medicare HMO | Admitting: Internal Medicine

## 2023-05-22 ENCOUNTER — Encounter: Payer: Self-pay | Admitting: Internal Medicine

## 2023-05-22 VITALS — BP 123/69 | HR 87 | Ht 67.0 in | Wt 197.8 lb

## 2023-05-22 DIAGNOSIS — E038 Other specified hypothyroidism: Secondary | ICD-10-CM | POA: Diagnosis not present

## 2023-05-22 DIAGNOSIS — Z125 Encounter for screening for malignant neoplasm of prostate: Secondary | ICD-10-CM | POA: Diagnosis not present

## 2023-05-22 DIAGNOSIS — E1142 Type 2 diabetes mellitus with diabetic polyneuropathy: Secondary | ICD-10-CM

## 2023-05-22 DIAGNOSIS — I10 Essential (primary) hypertension: Secondary | ICD-10-CM

## 2023-05-22 DIAGNOSIS — E782 Mixed hyperlipidemia: Secondary | ICD-10-CM | POA: Diagnosis not present

## 2023-05-22 DIAGNOSIS — Z0001 Encounter for general adult medical examination with abnormal findings: Secondary | ICD-10-CM | POA: Diagnosis not present

## 2023-05-22 DIAGNOSIS — E1165 Type 2 diabetes mellitus with hyperglycemia: Secondary | ICD-10-CM

## 2023-05-22 DIAGNOSIS — Z7984 Long term (current) use of oral hypoglycemic drugs: Secondary | ICD-10-CM

## 2023-05-22 DIAGNOSIS — G63 Polyneuropathy in diseases classified elsewhere: Secondary | ICD-10-CM

## 2023-05-22 DIAGNOSIS — N184 Chronic kidney disease, stage 4 (severe): Secondary | ICD-10-CM

## 2023-05-22 MED ORDER — SEMAGLUTIDE (1 MG/DOSE) 4 MG/3ML ~~LOC~~ SOPN: 1.0000 mg | PEN_INJECTOR | SUBCUTANEOUS | 3 refills | Status: DC

## 2023-05-22 MED ORDER — TRUE METRIX METER W/DEVICE KIT
1.0000 | PACK | 0 refills | Status: DC
Start: 2023-05-22 — End: 2023-10-31

## 2023-05-22 NOTE — Assessment & Plan Note (Addendum)
Lab Results  Component Value Date   TSH 5.880 (H) 05/22/2023   Rechecked TSH and free T4 - overall stable Asymptomatic currently

## 2023-05-22 NOTE — Progress Notes (Signed)
Established Patient Office Visit  Subjective:  Patient ID: Collin Yoder, male    DOB: [birth year]  Age: 72 y.o. MRN: [id number]  CC:  Chief Complaint  Patient presents with   Annual Exam    HPI Collin Yoder is a 72 y.o. male with past medical history of CVA, HTN, COPD, type II DM with neuropathy and tobacco abuse who presents for annual physical.  HTN: BP is well-controlled. Takes medications regularly. Patient denies headache, dizziness, chest pain, dyspnea or palpitations.   Type 2 DM: His last HbA1C was 8.6 in 12/23. He is on Glimepiride 2 mg BID currently.  His dose of glimepiride was recently increased to 4 mg, but his pharmacy has been dispensing old dose of 2 mg.  He admits that he has not been following low-carb diet recently.  His blood close has been elevated, mostly above 150, some times around 190.  He also takes Gabapentin for DM neuropathy. He states that he had balance problem due to neuropathy, but has been better since increasing dose of Gabapentin.   CKD: He sees Nephrologist. His last BMP showed GFR of 29. Denies any dysuria, hematuria or urinary hesitancy/resistance.   Subclinical hypothyroidism: His TSH was elevated in 12/23 with free T4 WNL.  He denies any recent change in weight or appetite.    Past Medical History:  Diagnosis Date   Borderline diabetes    Peripheral neuropathy 07/15/2019   Right lower lobe lung mass 04/11/2017   Thalamic stroke (HCC) 07/15/2019   Type II diabetes mellitus, uncontrolled 07/15/2019   Vitamin D deficiency disease 07/15/2019    Past Surgical History:  Procedure Laterality Date   APPENDECTOMY     CATARACT EXTRACTION, BILATERAL Bilateral 01/2021    Family History  Problem Relation Age of Onset   Diabetes Mother 6   Stroke Mother    Colon cancer Neg Hx     Social History   Socioeconomic History   Marital status: Single    Spouse name: Not on file   Number of children: Not on file   Years of education:  Not on file   Highest education level: Not on file  Occupational History   Not on file  Tobacco Use   Smoking status: Every Day    Current packs/day: 1.00    Average packs/day: 1 pack/day for 45.0 years (45.0 ttl pk-yrs)    Types: Cigarettes   Smokeless tobacco: Never   Tobacco comments:    Down to 1/2 ppd  Vaping Use   Vaping status: Never Used  Substance and Sexual Activity   Alcohol use: Yes    Alcohol/week: 1.0 standard drink of alcohol    Types: 1 Cans of beer per week    Comment: every 3 days   Drug use: No   Sexual activity: Not on file  Other Topics Concern   Not on file  Social History Narrative   Single.Lives alone in a motel.Works at [employer].   Social Determinants of Health   Financial Resource Strain: Low Risk  (03/15/2022)   Overall Financial Resource Strain (CARDIA)    Difficulty of Paying Living Expenses: Not hard at all  Food Insecurity: No Food Insecurity (03/15/2022)   Hunger Vital Sign    Worried About Running Out of Food in the Last Year: Never true    Ran Out of Food in the Last Year: Never true  Transportation Needs: No Transportation Needs (03/15/2022)   PRAPARE - Transportation    Lack of  Transportation (Medical): No    Lack of Transportation (Non-Medical): No  Physical Activity: Sufficiently Active (03/15/2022)   Exercise Vital Sign    Days of Exercise per Week: 7 days    Minutes of Exercise per Session: 50 min  Stress: No Stress Concern Present (03/15/2022)   [employer] of Occupational Health - Occupational Stress Questionnaire    Feeling of Stress : Not at all  Social Connections: Socially Isolated (03/15/2022)   Social Connection and Isolation Panel [NHANES]    Frequency of Communication with Friends and Family: More than three times a week    Frequency of Social Gatherings with Friends and Family: More than three times a week    Attends Religious Services: Never    Database administrator or Organizations: No    Attends Tax inspector Meetings: Never    Marital Status: Divorced  Catering manager Violence: Not At Risk (03/15/2022)   Humiliation, Afraid, Rape, and Kick questionnaire    Fear of Current or Ex-Partner: No    Emotionally Abused: No    Physically Abused: No    Sexually Abused: No    Outpatient Medications Prior to Visit  Medication Sig Dispense Refill   acetaminophen (TYLENOL) 325 MG tablet Take 2 tablets (650 mg total) by mouth every 6 (six) hours as needed for mild pain (or Fever >/= 101). 12 tablet 0   Alcohol Swabs (B-D SINGLE USE SWABS REGULAR) PADS 1 Units by Does not apply route daily at 12 noon. 100 each 1   amLODipine (NORVASC) 10 MG tablet Take 1 tablet (10 mg total) by mouth daily. 90 tablet 3   atorvastatin (LIPITOR) 40 MG tablet Take 1 tablet (40 mg total) by mouth daily. 90 tablet 3   Cholecalciferol (VITAMIN D3) 5000 units CAPS Take 1 capsule by mouth daily.     gabapentin (NEURONTIN) 300 MG capsule TAKE 1 CAPSULE BY MOUTH (3) TIMES DAILY. 270 capsule 0   glucose blood (TRUE METRIX BLOOD GLUCOSE TEST) test strip Use as instructed 100 each 12   olmesartan (BENICAR) 5 MG tablet Take 5 mg by mouth daily.     TRUEplus Lancets 33G MISC USE TO TEST DAILY AT 12 NOON. 100 each 10   Blood Glucose Monitoring Suppl (TRUE METRIX METER) w/Device KIT USE AS DIRECTED 1 kit 0   glimepiride (AMARYL) 2 MG tablet TAKE 1 TABLET BY MOUTH TWICE DAILY BEFORE A MEAL 60 tablet 0   Semaglutide,0.25 or 0.5MG /DOS, (OZEMPIC, 0.25 OR 0.5 MG/DOSE,) 2 MG/3ML SOPN Inject 0.5 mg into the skin every 7 (seven) days. 3 mL 0   No facility-administered medications prior to visit.    No Known Allergies  ROS Review of Systems  Constitutional:  Negative for chills and fever.  HENT:  Negative for congestion and sore throat.   Eyes:  Negative for pain and discharge.  Respiratory:  Negative for cough and shortness of breath.   Cardiovascular:  Negative for chest pain and palpitations.  Gastrointestinal:  Negative for  diarrhea, nausea and vomiting.  Endocrine: Negative for polydipsia and polyuria.  Genitourinary:  Negative for dysuria and hematuria.  Musculoskeletal:  Negative for neck pain and neck stiffness.  Skin:  Negative for rash.  Neurological:  Positive for tremors, weakness and numbness. Negative for dizziness and headaches.  Psychiatric/Behavioral:  Negative for agitation and behavioral problems.       Objective:    Physical Exam Vitals reviewed.  Constitutional:      General: He is not in acute  distress.    Appearance: He is not diaphoretic.  HENT:     Head: Normocephalic and atraumatic.     Nose: Nose normal.     Mouth/Throat:     Mouth: Mucous membranes are moist.  Eyes:     General: No scleral icterus.    Extraocular Movements: Extraocular movements intact.  Cardiovascular:     Rate and Rhythm: Normal rate and regular rhythm.     Heart sounds: Normal heart sounds. No murmur heard. Pulmonary:     Breath sounds: Normal breath sounds. No wheezing or rales.  Abdominal:     Palpations: Abdomen is soft.     Tenderness: There is no abdominal tenderness.  Musculoskeletal:     Cervical back: Neck supple. No tenderness.     Right lower leg: No edema.     Left lower leg: No edema.  Skin:    General: Skin is warm.     Findings: No rash.  Neurological:     General: No focal deficit present.     Mental Status: He is alert and oriented to person, place, and time.     Sensory: Sensory deficit (B/l feet) present.     Motor: Weakness (B/l LE - 4/5) present.     Comments: Resting tremors of right hand  Psychiatric:        Mood and Affect: Mood normal.        Behavior: Behavior normal.     BP 123/69 (BP Location: Left Arm, Patient Position: Sitting, Cuff Size: Normal)   Pulse 87   Ht 5\' 7"  (1.702 m)   Wt 197 lb 12.8 oz (89.7 kg)   SpO2 92%   BMI 30.98 kg/m  Wt Readings from Last 3 Encounters:  05/22/23 197 lb 12.8 oz (89.7 kg)  03/27/23 192 lb (87.1 kg)  01/18/23 195 lb  (88.5 kg)    Lab Results  Component Value Date   TSH 5.880 (H) 05/22/2023   Lab Results  Component Value Date   WBC 8.2 05/22/2023   HGB 12.0 (L) 05/22/2023   HCT 36.2 (L) 05/22/2023   MCV 94 05/22/2023   PLT 178 05/22/2023   Lab Results  Component Value Date   NA 142 05/22/2023   K 4.2 05/22/2023   CO2 23 05/22/2023   GLUCOSE 137 (H) 05/22/2023   BUN 27 05/22/2023   CREATININE 2.45 (H) 05/22/2023   BILITOT 0.8 05/22/2023   ALKPHOS 79 05/22/2023   AST 11 05/22/2023   ALT 12 05/22/2023   PROT 7.7 05/22/2023   ALBUMIN 4.6 05/22/2023   CALCIUM 9.5 05/22/2023   ANIONGAP 9 08/07/2021   EGFR 27 (L) 05/22/2023   Lab Results  Component Value Date   CHOL 118 05/22/2023   Lab Results  Component Value Date   HDL 35 (L) 05/22/2023   Lab Results  Component Value Date   LDLCALC 63 05/22/2023   Lab Results  Component Value Date   TRIG 106 05/22/2023   Lab Results  Component Value Date   CHOLHDL 3.4 05/22/2023   Lab Results  Component Value Date   HGBA1C 10.2 (H) 05/22/2023      Assessment & Plan:   Problem List Items Addressed This Visit       Cardiovascular and Mediastinum   Hypertension    BP Readings from Last 1 Encounters:  05/22/23 123/69   Well-controlled with Amlodipine 10 mg QD and olmesartan 5 mg QD Was on Losartan, was switched to Amlodipine recently due to AKI Counseled  for compliance with the medications Advised DASH diet and moderate exercise/walking as tolerated        Endocrine   Diabetes mellitus (HCC)    Lab Results  Component Value Date   HGBA1C 8.6 (H) 09/18/2022   Uncontrolled due to diet noncompliance On Glimepiride 2 mg BID - increased dose to 4 mg twice daily On Ozempic 0.25 mg qw, increase dose to 0.5 mg qw for 4 weeks and then 1 mg qw Was on metformin, which was discontinued recently due to AKI Advised to follow diabetic diet, diet material provided On statin  F/u BMP and HbA1C Diabetic eye exam: Advised to follow up  with Ophthalmology for diabetic eye exam  On gabapentin 300 mg 3 times daily for diabetic neuropathy      Relevant Medications   Blood Glucose Monitoring Suppl (TRUE METRIX METER) w/Device KIT   Semaglutide, 1 MG/DOSE, 4 MG/3ML SOPN   Other Relevant Orders   Hemoglobin A1c (Completed)   CMP14+EGFR (Completed)   Subclinical hypothyroidism    Lab Results  Component Value Date   TSH 5.880 (H) 05/22/2023   Rechecked TSH and free T4 - overall stable Asymptomatic currently      Relevant Orders   TSH + free T4 (Completed)     Nervous and Auditory   Peripheral neuropathy    Likely due to DM Well-controlled with gabapentin 300 mg 3 times daily        Genitourinary   Stage 4 chronic kidney disease (HCC)    Last BMP reviewed Followed by Nephrology Avoid nephrotoxic agents Advised to maintain adequate hydration      Relevant Orders   VITAMIN D 25 Hydroxy (Vit-D Deficiency, Fractures) (Completed)   CMP14+EGFR (Completed)   CBC with Differential/Platelet (Completed)     Other   Hyperlipidemia    On statin      Relevant Orders   Lipid panel (Completed)   Encounter for general adult medical examination with abnormal findings - Primary    Physical exam as documented. Fasting blood tests today. Denied low dose CT chest.      Prostate cancer screening    Ordered PSA after discussing its limitations for prostate cancer screening, including false positive results leading to additional investigations.       Relevant Orders   PSA (Completed)    Meds ordered this encounter  Medications   Blood Glucose Monitoring Suppl (TRUE METRIX METER) w/Device KIT    Sig: 1 kit by Does not apply route See admin instructions.    Dispense:  1 kit    Refill:  0   Semaglutide, 1 MG/DOSE, 4 MG/3ML SOPN    Sig: Inject 1 mg as directed once a week.    Dispense:  3 mL    Refill:  3    Follow-up: Return in about 4 months (around 09/21/2023) for DM.    Anabel Halon, MD

## 2023-05-22 NOTE — Assessment & Plan Note (Signed)
Last BMP reviewed Followed by Nephrology Avoid nephrotoxic agents Advised to maintain adequate hydration

## 2023-05-22 NOTE — Assessment & Plan Note (Addendum)
BP Readings from Last 1 Encounters:  05/22/23 123/69   Well-controlled with Amlodipine 10 mg QD and olmesartan 5 mg QD Was on Losartan, was switched to Amlodipine recently due to AKI Counseled for compliance with the medications Advised DASH diet and moderate exercise/walking as tolerated

## 2023-05-22 NOTE — Patient Instructions (Signed)
Please start taking Ozempic 0.5 mg once weekly till you complete current supplies. Then, start taking 1 mg dose once weekly.  Please continue to take other medications as prescribed.  Please continue to follow low carb diet and perform moderate exercise/walking at least 150 mins/week.

## 2023-05-22 NOTE — Assessment & Plan Note (Addendum)
Lab Results  Component Value Date   HGBA1C 8.6 (H) 09/18/2022   Uncontrolled due to diet noncompliance On Glimepiride 2 mg BID - increased dose to 4 mg twice daily On Ozempic 0.25 mg qw, increase dose to 0.5 mg qw for 4 weeks and then 1 mg qw Was on metformin, which was discontinued recently due to AKI Advised to follow diabetic diet, diet material provided On statin  F/u BMP and HbA1C Diabetic eye exam: Advised to follow up with Ophthalmology for diabetic eye exam  On gabapentin 300 mg 3 times daily for diabetic neuropathy

## 2023-05-22 NOTE — Assessment & Plan Note (Addendum)
Likely due to DM Well-controlled with gabapentin 300 mg 3 times daily

## 2023-05-23 ENCOUNTER — Other Ambulatory Visit: Payer: Self-pay | Admitting: Internal Medicine

## 2023-05-23 DIAGNOSIS — E1142 Type 2 diabetes mellitus with diabetic polyneuropathy: Secondary | ICD-10-CM

## 2023-05-23 LAB — CBC WITH DIFFERENTIAL/PLATELET
Basophils Absolute: 0.1 10*3/uL (ref 0.0–0.2)
Basos: 2 %
EOS (ABSOLUTE): 0.4 10*3/uL (ref 0.0–0.4)
Eos: 4 %
Hematocrit: 36.2 % — ABNORMAL LOW (ref 37.5–51.0)
Hemoglobin: 12 g/dL — ABNORMAL LOW (ref 13.0–17.7)
Immature Grans (Abs): 0 10*3/uL (ref 0.0–0.1)
Immature Granulocytes: 0 %
Lymphocytes Absolute: 2.8 10*3/uL (ref 0.7–3.1)
Lymphs: 34 %
MCH: 31.2 pg (ref 26.6–33.0)
MCHC: 33.1 g/dL (ref 31.5–35.7)
MCV: 94 fL (ref 79–97)
Monocytes Absolute: 0.5 10*3/uL (ref 0.1–0.9)
Monocytes: 6 %
Neutrophils Absolute: 4.4 10*3/uL (ref 1.4–7.0)
Neutrophils: 54 %
Platelets: 178 10*3/uL (ref 150–450)
RBC: 3.85 x10E6/uL — ABNORMAL LOW (ref 4.14–5.80)
RDW: 11.4 % — ABNORMAL LOW (ref 11.6–15.4)
WBC: 8.2 10*3/uL (ref 3.4–10.8)

## 2023-05-23 LAB — TSH+FREE T4
Free T4: 1.17 ng/dL (ref 0.82–1.77)
TSH: 5.88 u[IU]/mL — ABNORMAL HIGH (ref 0.450–4.500)

## 2023-05-23 LAB — CMP14+EGFR
ALT: 12 IU/L (ref 0–44)
AST: 11 IU/L (ref 0–40)
Albumin: 4.6 g/dL (ref 3.8–4.8)
Alkaline Phosphatase: 79 IU/L (ref 44–121)
BUN/Creatinine Ratio: 11 (ref 10–24)
BUN: 27 mg/dL (ref 8–27)
Bilirubin Total: 0.8 mg/dL (ref 0.0–1.2)
CO2: 23 mmol/L (ref 20–29)
Calcium: 9.5 mg/dL (ref 8.6–10.2)
Chloride: 103 mmol/L (ref 96–106)
Creatinine, Ser: 2.45 mg/dL — ABNORMAL HIGH (ref 0.76–1.27)
Globulin, Total: 3.1 g/dL (ref 1.5–4.5)
Glucose: 137 mg/dL — ABNORMAL HIGH (ref 70–99)
Potassium: 4.2 mmol/L (ref 3.5–5.2)
Sodium: 142 mmol/L (ref 134–144)
Total Protein: 7.7 g/dL (ref 6.0–8.5)
eGFR: 27 mL/min/{1.73_m2} — ABNORMAL LOW (ref >59)

## 2023-05-23 LAB — LIPID PANEL
Chol/HDL Ratio: 3.4 ratio (ref 0.0–5.0)
Cholesterol, Total: 118 mg/dL (ref 100–199)
HDL: 35 mg/dL — ABNORMAL LOW (ref >39)
LDL Chol Calc (NIH): 63 mg/dL (ref 0–99)
Triglycerides: 106 mg/dL (ref 0–149)
VLDL Cholesterol Cal: 20 mg/dL (ref 5–40)

## 2023-05-23 LAB — VITAMIN D 25 HYDROXY (VIT D DEFICIENCY, FRACTURES): Vit D, 25-Hydroxy: 56.5 ng/mL (ref 30.0–100.0)

## 2023-05-23 LAB — PSA: Prostate Specific Ag, Serum: 0.8 ng/mL (ref 0.0–4.0)

## 2023-05-23 LAB — HEMOGLOBIN A1C
Est. average glucose Bld gHb Est-mCnc: 246 mg/dL
Hgb A1c MFr Bld: 10.2 % — ABNORMAL HIGH (ref 4.8–5.6)

## 2023-05-23 MED ORDER — GLIMEPIRIDE 4 MG PO TABS
4.0000 mg | ORAL_TABLET | Freq: Two times a day (BID) | ORAL | 3 refills | Status: DC
Start: 2023-05-23 — End: 2023-09-13

## 2023-05-25 NOTE — Assessment & Plan Note (Signed)
On statin.

## 2023-05-25 NOTE — Assessment & Plan Note (Signed)
Ordered PSA after discussing its limitations for prostate cancer screening, including false positive results leading to additional investigations. 

## 2023-05-25 NOTE — Assessment & Plan Note (Addendum)
Physical exam as documented. Fasting blood tests today. Denied low dose CT chest.

## 2023-06-07 DIAGNOSIS — N170 Acute kidney failure with tubular necrosis: Secondary | ICD-10-CM | POA: Diagnosis not present

## 2023-06-07 DIAGNOSIS — D638 Anemia in other chronic diseases classified elsewhere: Secondary | ICD-10-CM | POA: Diagnosis not present

## 2023-06-07 DIAGNOSIS — N1832 Chronic kidney disease, stage 3b: Secondary | ICD-10-CM | POA: Diagnosis not present

## 2023-06-07 DIAGNOSIS — R809 Proteinuria, unspecified: Secondary | ICD-10-CM | POA: Diagnosis not present

## 2023-06-07 DIAGNOSIS — E875 Hyperkalemia: Secondary | ICD-10-CM | POA: Diagnosis not present

## 2023-06-21 ENCOUNTER — Telehealth: Payer: Self-pay | Admitting: Internal Medicine

## 2023-06-21 ENCOUNTER — Other Ambulatory Visit: Payer: Self-pay

## 2023-06-21 DIAGNOSIS — G63 Polyneuropathy in diseases classified elsewhere: Secondary | ICD-10-CM

## 2023-06-21 MED ORDER — GABAPENTIN 300 MG PO CAPS
300.0000 mg | ORAL_CAPSULE | Freq: Three times a day (TID) | ORAL | 0 refills | Status: DC
Start: 2023-06-21 — End: 2023-10-23

## 2023-06-21 NOTE — Telephone Encounter (Signed)
Refills sent to pharmacy. 

## 2023-06-21 NOTE — Telephone Encounter (Signed)
Prescription Request  06/21/2023  LOV: 05/22/2023  What is the name of the medication or equipment? gabapentin (NEURONTIN) 300 MG capsule   Have you contacted your pharmacy to request a refill? Yes   Which pharmacy would you like this sent to?  [facility]   Patient notified that their request is being sent to the clinical staff for review and that they should receive a response within 2 business days.   Please advise at [facility] [phone]

## 2023-07-25 ENCOUNTER — Ambulatory Visit (INDEPENDENT_AMBULATORY_CARE_PROVIDER_SITE_OTHER): Payer: Medicare HMO | Admitting: Internal Medicine

## 2023-07-25 ENCOUNTER — Encounter: Payer: Self-pay | Admitting: Internal Medicine

## 2023-07-25 VITALS — BP 132/70 | HR 85 | Ht 67.0 in | Wt 199.0 lb

## 2023-07-25 DIAGNOSIS — Z23 Encounter for immunization: Secondary | ICD-10-CM | POA: Diagnosis not present

## 2023-07-25 DIAGNOSIS — R221 Localized swelling, mass and lump, neck: Secondary | ICD-10-CM | POA: Insufficient documentation

## 2023-07-25 NOTE — Assessment & Plan Note (Signed)
Neck mass appears to be cystic Considering its size, will get [place] of neck If any change in size or shape or if becomes symptomatic, may need surgical I&D

## 2023-07-25 NOTE — Patient Instructions (Signed)
You are being scheduled to get [place] of neck.

## 2023-07-25 NOTE — Progress Notes (Signed)
Acute Office Visit  Subjective:    Patient ID: Collin Yoder, male    DOB: 07-21-1951, 72 y.o.   MRN: [id number]  Chief Complaint  Patient presents with   Cyst    Knot on the back of his neck     HPI Patient is in today for his complaint of noticing a knot on the back of his neck since 07/19/23.  He has tried applying warm compresses over it, as he was grown hair concerned about ingrown hair.  He denies any pain in the area currently.  Denies any head injury.   Past Medical History:  Diagnosis Date   Borderline diabetes    Peripheral neuropathy 07/15/2019   Right lower lobe lung mass 04/11/2017   Thalamic stroke (HCC) 07/15/2019   Type II diabetes mellitus, uncontrolled 07/15/2019   Vitamin D deficiency disease 07/15/2019    Past Surgical History:  Procedure Laterality Date   APPENDECTOMY     CATARACT EXTRACTION, BILATERAL Bilateral 01/2021    Family History  Problem Relation Age of Onset   Diabetes Mother 53   Stroke Mother    Colon cancer Neg Hx     Social History   Socioeconomic History   Marital status: Single    Spouse name: Not on file   Number of children: Not on file   Years of education: Not on file   Highest education level: Not on file  Occupational History   Not on file  Tobacco Use   Smoking status: Every Day    Current packs/day: 1.00    Average packs/day: 1 pack/day for 45.0 years (45.0 ttl pk-yrs)    Types: Cigarettes   Smokeless tobacco: Never   Tobacco comments:    Down to 1/2 ppd  Vaping Use   Vaping status: Never Used  Substance and Sexual Activity   Alcohol use: Yes    Alcohol/week: 1.0 standard drink of alcohol    Types: 1 Cans of beer per week    Comment: every 3 days   Drug use: No   Sexual activity: Not on file  Other Topics Concern   Not on file  Social History Narrative   Single.Lives alone in a motel.Works at [employer].   Social Determinants of Health   Financial Resource Strain: Low Risk  (03/15/2022)    Overall Financial Resource Strain (CARDIA)    Difficulty of Paying Living Expenses: Not hard at all  Food Insecurity: No Food Insecurity (03/15/2022)   Hunger Vital Sign    Worried About Running Out of Food in the Last Year: Never true    Ran Out of Food in the Last Year: Never true  Transportation Needs: No Transportation Needs (03/15/2022)   PRAPARE - Administrator, Civil Service (Medical): No    Lack of Transportation (Non-Medical): No  Physical Activity: Sufficiently Active (03/15/2022)   Exercise Vital Sign    Days of Exercise per Week: 7 days    Minutes of Exercise per Session: 50 min  Stress: No Stress Concern Present (03/15/2022)   [employer] of Occupational Health - Occupational Stress Questionnaire    Feeling of Stress : Not at all  Social Connections: Socially Isolated (03/15/2022)   Social Connection and Isolation Panel [NHANES]    Frequency of Communication with Friends and Family: More than three times a week    Frequency of Social Gatherings with Friends and Family: More than three times a week    Attends Religious Services: Never  Active Member of Clubs or Organizations: No    Attends Banker Meetings: Never    Marital Status: Divorced  Catering manager Violence: Not At Risk (03/15/2022)   Humiliation, Afraid, Rape, and Kick questionnaire    Fear of Current or Ex-Partner: No    Emotionally Abused: No    Physically Abused: No    Sexually Abused: No    Outpatient Medications Prior to Visit  Medication Sig Dispense Refill   acetaminophen (TYLENOL) 325 MG tablet Take 2 tablets (650 mg total) by mouth every 6 (six) hours as needed for mild pain (or Fever >/= 101). 12 tablet 0   Alcohol Swabs (B-D SINGLE USE SWABS REGULAR) PADS 1 Units by Does not apply route daily at 12 noon. 100 each 1   amLODipine (NORVASC) 10 MG tablet Take 1 tablet (10 mg total) by mouth daily. 90 tablet 3   atorvastatin (LIPITOR) 40 MG tablet Take 1 tablet (40 mg  total) by mouth daily. 90 tablet 3   Blood Glucose Monitoring Suppl (TRUE METRIX METER) w/Device KIT 1 kit by Does not apply route See admin instructions. 1 kit 0   Cholecalciferol (VITAMIN D3) 5000 units CAPS Take 1 capsule by mouth daily.     gabapentin (NEURONTIN) 300 MG capsule Take 1 capsule (300 mg total) by mouth 3 (three) times daily. 270 capsule 0   glimepiride (AMARYL) 4 MG tablet Take 1 tablet (4 mg total) by mouth 2 (two) times daily before a meal. 60 tablet 3   glucose blood (TRUE METRIX BLOOD GLUCOSE TEST) test strip Use as instructed 100 each 12   olmesartan (BENICAR) 5 MG tablet Take 5 mg by mouth daily.     Semaglutide, 1 MG/DOSE, 4 MG/3ML SOPN Inject 1 mg as directed once a week. 3 mL 3   TRUEplus Lancets 33G MISC USE TO TEST DAILY AT 12 NOON. 100 each 10   No facility-administered medications prior to visit.    No Known Allergies  Review of Systems  Constitutional:  Negative for chills and fever.  HENT:  Negative for congestion and sore throat.   Eyes:  Negative for pain and discharge.  Respiratory:  Negative for cough and shortness of breath.   Cardiovascular:  Negative for chest pain and palpitations.  Gastrointestinal:  Negative for diarrhea, nausea and vomiting.  Endocrine: Negative for polydipsia and polyuria.  Genitourinary:  Negative for dysuria and hematuria.  Musculoskeletal:  Negative for neck pain and neck stiffness.  Skin:  Negative for rash.       Neck mass  Neurological:  Positive for tremors, weakness and numbness. Negative for dizziness and headaches.  Psychiatric/Behavioral:  Negative for agitation and behavioral problems.        Objective:    Physical Exam Vitals reviewed.  Constitutional:      General: He is not in acute distress.    Appearance: He is not diaphoretic.  HENT:     Head: Normocephalic and atraumatic.     Nose: Nose normal.     Mouth/Throat:     Mouth: Mucous membranes are moist.  Eyes:     General: No scleral icterus.     Extraocular Movements: Extraocular movements intact.  Neck:     Comments: Posterior neck mass - oval shaped, about 5 cm X 2 cm, nontender Cardiovascular:     Rate and Rhythm: Normal rate and regular rhythm.     Heart sounds: Normal heart sounds. No murmur heard. Pulmonary:     Breath sounds:  Normal breath sounds. No wheezing or rales.  Musculoskeletal:     Cervical back: Neck supple. No tenderness.     Right lower leg: No edema.     Left lower leg: No edema.  Skin:    General: Skin is warm.     Findings: No rash.  Neurological:     General: No focal deficit present.     Mental Status: He is alert and oriented to person, place, and time.     Sensory: Sensory deficit (B/l feet) present.     Motor: Weakness (B/l LE - 4/5) present.     Comments: Resting tremors of right hand  Psychiatric:        Mood and Affect: Mood normal.        Behavior: Behavior normal.     BP 132/70 (BP Location: Right Arm, Patient Position: Sitting, Cuff Size: Normal)   Pulse 85   Ht 5\' 7"  (1.702 m)   Wt 199 lb (90.3 kg)   SpO2 94%   BMI 31.17 kg/m  Wt Readings from Last 3 Encounters:  07/25/23 199 lb (90.3 kg)  05/22/23 197 lb 12.8 oz (89.7 kg)  03/27/23 192 lb (87.1 kg)        Assessment & Plan:   Problem List Items Addressed This Visit       Other   Neck mass - Primary    Neck mass appears to be cystic Considering its size, will get [place] of neck If any change in size or shape or if becomes symptomatic, may need surgical I&D      Relevant Orders   US Soft Tissue Head/Neck (NON-THYROID)   Other Visit Diagnoses     Encounter for immunization       Relevant Orders   Flu Vaccine Trivalent High Dose (Fluad) (Completed)        No orders of the defined types were placed in this encounter.    Anabel Halon, MD

## 2023-08-01 ENCOUNTER — Ambulatory Visit: Payer: Medicare HMO | Admitting: Internal Medicine

## 2023-08-01 ENCOUNTER — Ambulatory Visit (HOSPITAL_COMMUNITY)
Admission: RE | Admit: 2023-08-01 | Discharge: 2023-08-01 | Disposition: A | Discharge status: Home / Self Care | Payer: Medicare HMO | Source: Ambulatory Visit | Attending: Internal Medicine | Admitting: Internal Medicine

## 2023-08-01 DIAGNOSIS — E042 Nontoxic multinodular goiter: Secondary | ICD-10-CM | POA: Diagnosis not present

## 2023-08-01 DIAGNOSIS — L723 Sebaceous cyst: Secondary | ICD-10-CM | POA: Diagnosis not present

## 2023-08-01 DIAGNOSIS — R221 Localized swelling, mass and lump, neck: Secondary | ICD-10-CM | POA: Insufficient documentation

## 2023-08-06 ENCOUNTER — Other Ambulatory Visit: Payer: Self-pay | Admitting: Internal Medicine

## 2023-08-06 ENCOUNTER — Telehealth: Payer: Self-pay | Admitting: Internal Medicine

## 2023-08-06 DIAGNOSIS — I10 Essential (primary) hypertension: Secondary | ICD-10-CM

## 2023-08-06 NOTE — Telephone Encounter (Signed)
Patient called asking has Dr Allena Katz has heard back about test results on the cyst on back of neck, waiting on decision from surgery from Dr Allena Katz, patient call back # [phone]

## 2023-08-06 NOTE — Telephone Encounter (Signed)
Left message for patient

## 2023-08-10 ENCOUNTER — Ambulatory Visit: Payer: Self-pay | Admitting: Internal Medicine

## 2023-08-10 NOTE — Telephone Encounter (Signed)
Copied from CRM [id number]. Topic: Clinical - Medical Advice >> Aug 10, 2023  2:42 PM Prudencio Pair wrote: Reason for CRM: Patient calling stating that he has a cyst on the back of his neck that's draining fluid. Wants to know what needs to be done.   Chief Complaint: Drainage of Cyst/Boil Symptoms: Draining Clear fluid Frequency: Acute Pertinent Negatives: Patient denies any associated symptoms Disposition: [] ED /[] Urgent Care (no appt availability in office) / [x] Appointment(In office/virtual)/ []  [facility] [facility]/ [] Home Care/ [] Refused Recommended Disposition /[] [place] [facility]/ []  Follow-up with PCP Additional Notes: Appointment scheduled, denied earlier urgent care visit option.     Reason for Disposition  [1] Boil AND [2] not improved > 3 days following Care Advice  Answer Assessment - Initial Assessment Questions 1. APPEARANCE of BOIL: "What does the boil look like?"      Red 2. LOCATION: "Where is the boil located?"      Back of the neck 3. NUMBER: "How many boils are there?"      1 4. SIZE: "How big is the boil?" (e.g., inches, cm; compare to size of a coin or other object)     Quarter size 5. ONSET: "When did the boil start?"     About a week 6. PAIN: "Is there any pain?" If Yes, ask: "How bad is the pain?"   (Scale 1-10; or mild, moderate, severe)     0 7. FEVER: "Do you have a fever?" If Yes, ask: "What is it, how was it measured, and when did it start?"      No 8. SOURCE: "Have you been around anyone with boils or other Staph infections?" "Have you ever had boils before?"     No 9. OTHER SYMPTOMS: "Do you have any other symptoms?" (e.g., shaking chills, weakness, rash elsewhere on body)  Protocols used: Boil (Skin Abscess)-A-AH

## 2023-08-10 NOTE — Telephone Encounter (Signed)
Added to a waiting list. Will contact patient with any cancellations.

## 2023-08-13 ENCOUNTER — Telehealth: Payer: Self-pay

## 2023-08-13 NOTE — Telephone Encounter (Signed)
Spoke to patient

## 2023-08-13 NOTE — Telephone Encounter (Signed)
Copied from CRM [id number]. Topic: Clinical - Medical Advice >> Aug 13, 2023 11:14 AM Deaijah H wrote: Reason for CRM: Pt std he received a voicemail would like to be reached back to / asked to speak with front office

## 2023-08-13 NOTE — Telephone Encounter (Signed)
Patient returning lab results call 

## 2023-08-19 NOTE — Progress Notes (Unsigned)
   Established Patient Office Visit   Subjective  Patient ID: Collin Yoder, male    DOB: 1951-01-06  Age: 72 y.o. MRN: [id number]  No chief complaint on file.   He  has a past medical history of Borderline diabetes, Peripheral neuropathy (07/15/2019), Right lower lobe lung mass (04/11/2017), Thalamic stroke (HCC) (07/15/2019), Type II diabetes mellitus, uncontrolled (07/15/2019), and Vitamin D deficiency disease (07/15/2019).  HPI  ROS    Objective:     There were no vitals taken for this visit. {Vitals History (Optional):23777}  Physical Exam   No results found for any visits on 08/20/23.  The ASCVD Risk score (Arnett DK, et al., 2019) failed to calculate for the following reasons:   The patient has a prior MI or stroke diagnosis    Assessment & Plan:  There are no diagnoses linked to this encounter.  No follow-ups on file.   Cruzita Lederer Newman Nip, FNP

## 2023-08-19 NOTE — Patient Instructions (Signed)

## 2023-08-20 ENCOUNTER — Ambulatory Visit: Payer: Self-pay | Admitting: Family Medicine

## 2023-08-24 ENCOUNTER — Telehealth: Payer: Self-pay

## 2023-08-24 NOTE — Patient Outreach (Signed)
Attempted to contact patient regarding care gaps. Left voicemail for patient to return my call at [phone].  Nicholes Rough, CMA Care Guide [employer]

## 2023-08-28 ENCOUNTER — Ambulatory Visit: Payer: Self-pay | Admitting: Internal Medicine

## 2023-08-28 ENCOUNTER — Telehealth: Payer: Self-pay

## 2023-08-28 NOTE — Telephone Encounter (Signed)
  Chief Complaint: high blood sugar Symptoms: highest blood sugar today 399, increased urination.  Frequency: 3 days, each time blood sugar is checked. Pertinent Negatives: Patient denies SOB, dizziness, fever, weakness, vomiting. Disposition: [] ED /[] Urgent Care (no appt availability in office) / [x] Appointment(In office/virtual)/ []  [facility]/ [] Home Care/ [] Refused Recommended Disposition /[] [facility]/ []  Follow-up with PCP Additional Notes: Pt calls stating he has had increased blood sugar readings today. Reports 315, 370 earlier today and then 399 approx two hours ago. Pt states he may have increased increased urination, but is unsure because he drinks a lot of water. Pt denies missing any medications and states he administered his semaglutide injection today. States he has had a decreased appetite "cannot finish everything on my plate". Per protocol, recommendations to call PCP now. This RN attempted to contact clinic access line twice with no answer. RN scheduled pt for first available appt 11/27 at 0840 with PCP per pt request. Pt encouraged to call back with any new or worsening symptoms or seek urgent/emergency care if necessary. Pt agreeable to plan, verbalized understanding. Alerting PCP for review.   Copied from CRM [id number]. Topic: Clinical - Red Word Triage >> Aug 28, 2023  4:12 PM Fuller Mandril wrote: Red Word that prompted transfer to Nurse Triage: Blood Sugar over 300 Reason for Disposition  [1] Blood glucose > 300 mg/dL (29.5 mmol/L) AND [6] two or more times in a row  Answer Assessment - Initial Assessment Questions 1. BLOOD GLUCOSE: "What is your blood glucose level?"      399  2. ONSET: "When did you check the blood glucose?"      approx 2 hours ago 3. USUAL RANGE: "What is your glucose level usually?" (e.g., usual fasting morning value, usual evening value)     Varies but usually 120-160 4. KETONES: "Do you check for ketones (urine or blood test  strips)?" If Yes, ask: "What does the test show now?"      denies 5. TYPE 1 or 2:  "Do you know what type of diabetes you have?"  (e.g., Type 1, Type 2, Gestational; doesn't know)      Type 2 6. INSULIN: "Do you take insulin?" "What type of insulin(s) do you use? What is the mode of delivery? (syringe, pen; injection or pump)?"      Semaglutide- administered injection today 7. DIABETES PILLS: "Do you take any pills for your diabetes?" If Yes, ask: "Have you missed taking any pills recently?"     Glimepiride, denies missing doses. 8. OTHER SYMPTOMS: "Do you have any symptoms?" (e.g., fever, frequent urination, difficulty breathing, dizziness, weakness, vomiting)     Increased urination, decreased appetite x 3 days.  Protocols used: Diabetes - High Blood Sugar-A-AH

## 2023-08-28 NOTE — Telephone Encounter (Signed)
Patient to see dr patel tomorrow

## 2023-08-28 NOTE — Telephone Encounter (Signed)
Copied from CRM [id number]. Topic: Clinical - Medical Advice >> Aug 28, 2023 12:23 PM Amy B wrote: Reason for CRM: Patient states he is unable to get his blood sugar to go down.  It is in the 359-377 range.  He requests a call back to discuss, [phone].

## 2023-08-29 ENCOUNTER — Encounter: Payer: Self-pay | Admitting: Internal Medicine

## 2023-08-29 ENCOUNTER — Ambulatory Visit (INDEPENDENT_AMBULATORY_CARE_PROVIDER_SITE_OTHER): Payer: Medicare HMO | Admitting: Internal Medicine

## 2023-08-29 VITALS — BP 100/64 | HR 84 | Ht 67.0 in | Wt 192.6 lb

## 2023-08-29 DIAGNOSIS — N184 Chronic kidney disease, stage 4 (severe): Secondary | ICD-10-CM | POA: Diagnosis not present

## 2023-08-29 DIAGNOSIS — I10 Essential (primary) hypertension: Secondary | ICD-10-CM | POA: Diagnosis not present

## 2023-08-29 DIAGNOSIS — Z794 Long term (current) use of insulin: Secondary | ICD-10-CM

## 2023-08-29 DIAGNOSIS — E1142 Type 2 diabetes mellitus with diabetic polyneuropathy: Secondary | ICD-10-CM

## 2023-08-29 MED ORDER — SEMAGLUTIDE (2 MG/DOSE) 8 MG/3ML ~~LOC~~ SOPN: 2.0000 mg | PEN_INJECTOR | SUBCUTANEOUS | 3 refills | Status: DC

## 2023-08-29 MED ORDER — LANTUS SOLOSTAR 100 UNIT/ML ~~LOC~~ SOPN: 10.0000 [IU] | PEN_INJECTOR | Freq: Every day | SUBCUTANEOUS | 0 refills | Status: DC

## 2023-08-29 MED ORDER — PEN NEEDLES 32G X 4 MM MISC: 2 refills | Status: DC

## 2023-08-29 NOTE — Assessment & Plan Note (Signed)
Last BMP reviewed Followed by Nephrology Avoid nephrotoxic agents Advised to maintain adequate hydration

## 2023-08-29 NOTE — Patient Instructions (Signed)
Please start taking Lantus 10 U at bedtime.  Start taking Ozempic 2 mg dose after completing the current supplies of 1 mg.  Continue taking Glimepiride as prescribed.  Please continue to follow low carb diet and perform moderate exercise/walking as tolerated.

## 2023-08-29 NOTE — Assessment & Plan Note (Addendum)
Lab Results  Component Value Date   HGBA1C 10.2 (H) 05/22/2023   Uncontrolled due to diet noncompliance On Glimepiride 2 mg BID - increased dose to 4 mg twice daily On Ozempic 1 mg qw, increased dose to 2 mg qw Started Lantus 10 units nightly Was on metformin, which was discontinued recently due to AKI Advised to follow diabetic diet, diet material provided Advised to check blood glucose regularly and bring the log in the next visit-contact if blood glucose more than 300 or less than 70 On statin  F/u BMP and HbA1C Diabetic eye exam: Advised to follow up with Ophthalmology for diabetic eye exam  On gabapentin 300 mg 3 times daily for diabetic neuropathy

## 2023-08-29 NOTE — Assessment & Plan Note (Signed)
BP Readings from Last 1 Encounters:  08/29/23 100/64   Well-controlled with Amlodipine 10 mg QD and olmesartan 5 mg QD Was on Losartan, was switched to Amlodipine recently due to AKI Counseled for compliance with the medications Advised DASH diet and moderate exercise/walking as tolerated

## 2023-08-29 NOTE — Progress Notes (Signed)
Established Patient Office Visit  Subjective:  Patient ID: Collin Yoder, male    DOB: Jan 31, 1951  Age: 72 y.o. MRN: [id number]  CC:  Chief Complaint  Patient presents with   Hyperglycemia    High blood sugar ranging between 250-399    HPI Collin Yoder is a 72 y.o. male with past medical history of CVA, HTN, COPD, type II DM with neuropathy and tobacco abuse who presents for f/u of his DM.  HTN: BP is well-controlled. Takes medications regularly. Patient denies headache, dizziness, chest pain, dyspnea or palpitations.   Type 2 DM: His last HbA1C was 10.1 in 08/24. He is on Ozempic 1 mg QW and glimepiride 4 mg BID currently. He admits that he has not been following low-carb diet recently.  His blood close has been elevated, mostly above 250, some times around 350-400.  He also takes Gabapentin for DM neuropathy. He states that he had balance problem due to neuropathy, but has been better since increasing dose of Gabapentin.  CKD: He sees Nephrologist. His last BMP showed GFR of 29. Denies any dysuria, hematuria or urinary hesitancy/resistance.  Past Medical History:  Diagnosis Date   Borderline diabetes    Peripheral neuropathy 07/15/2019   Right lower lobe lung mass 04/11/2017   Thalamic stroke (HCC) 07/15/2019   Type II diabetes mellitus, uncontrolled 07/15/2019   Vitamin D deficiency disease 07/15/2019    Past Surgical History:  Procedure Laterality Date   APPENDECTOMY     CATARACT EXTRACTION, BILATERAL Bilateral 01/2021    Family History  Problem Relation Age of Onset   Diabetes Mother 68   Stroke Mother    Colon cancer Neg Hx     Social History   Socioeconomic History   Marital status: Single    Spouse name: Not on file   Number of children: Not on file   Years of education: Not on file   Highest education level: Not on file  Occupational History   Not on file  Tobacco Use   Smoking status: Every Day    Current packs/day: 1.00    Average  packs/day: 1 pack/day for 45.0 years (45.0 ttl pk-yrs)    Types: Cigarettes   Smokeless tobacco: Never   Tobacco comments:    Down to 1/2 ppd  Vaping Use   Vaping status: Never Used  Substance and Sexual Activity   Alcohol use: Yes    Alcohol/week: 1.0 standard drink of alcohol    Types: 1 Cans of beer per week    Comment: every 3 days   Drug use: No   Sexual activity: Not on file  Other Topics Concern   Not on file  Social History Narrative   Single.Lives alone in a motel.Works at [employer].   Social Determinants of Health   Financial Resource Strain: Low Risk  (03/15/2022)   Overall Financial Resource Strain (CARDIA)    Difficulty of Paying Living Expenses: Not hard at all  Food Insecurity: No Food Insecurity (03/15/2022)   Hunger Vital Sign    Worried About Running Out of Food in the Last Year: Never true    Ran Out of Food in the Last Year: Never true  Transportation Needs: No Transportation Needs (03/15/2022)   PRAPARE - Administrator, Civil Service (Medical): No    Lack of Transportation (Non-Medical): No  Physical Activity: Sufficiently Active (03/15/2022)   Exercise Vital Sign    Days of Exercise per Week: 7 days  Minutes of Exercise per Session: 50 min  Stress: No Stress Concern Present (03/15/2022)   [employer] of Occupational Health - Occupational Stress Questionnaire    Feeling of Stress : Not at all  Social Connections: Socially Isolated (03/15/2022)   Social Connection and Isolation Panel [NHANES]    Frequency of Communication with Friends and Family: More than three times a week    Frequency of Social Gatherings with Friends and Family: More than three times a week    Attends Religious Services: Never    Database administrator or Organizations: No    Attends Banker Meetings: Never    Marital Status: Divorced  Catering manager Violence: Not At Risk (03/15/2022)   Humiliation, Afraid, Rape, and Kick questionnaire    Fear  of Current or Ex-Partner: No    Emotionally Abused: No    Physically Abused: No    Sexually Abused: No    Outpatient Medications Prior to Visit  Medication Sig Dispense Refill   acetaminophen (TYLENOL) 325 MG tablet Take 2 tablets (650 mg total) by mouth every 6 (six) hours as needed for mild pain (or Fever >/= 101). 12 tablet 0   Alcohol Swabs (B-D SINGLE USE SWABS REGULAR) PADS 1 Units by Does not apply route daily at 12 noon. 100 each 1   amLODipine (NORVASC) 10 MG tablet TAKE ONE TABLET BY MOUTH ONCE DAILY. 90 tablet 3   atorvastatin (LIPITOR) 40 MG tablet Take 1 tablet (40 mg total) by mouth daily. 90 tablet 3   Blood Glucose Monitoring Suppl (TRUE METRIX METER) w/Device KIT 1 kit by Does not apply route See admin instructions. 1 kit 0   Cholecalciferol (VITAMIN D3) 5000 units CAPS Take 1 capsule by mouth daily.     gabapentin (NEURONTIN) 300 MG capsule Take 1 capsule (300 mg total) by mouth 3 (three) times daily. 270 capsule 0   glimepiride (AMARYL) 4 MG tablet Take 1 tablet (4 mg total) by mouth 2 (two) times daily before a meal. 60 tablet 3   glucose blood (TRUE METRIX BLOOD GLUCOSE TEST) test strip Use as instructed 100 each 12   olmesartan (BENICAR) 5 MG tablet Take 5 mg by mouth daily.     TRUEplus Lancets 33G MISC USE TO TEST DAILY AT 12 NOON. 100 each 10   Semaglutide, 1 MG/DOSE, 4 MG/3ML SOPN Inject 1 mg as directed once a week. 3 mL 3   No facility-administered medications prior to visit.    No Known Allergies  ROS Review of Systems  Constitutional:  Negative for chills and fever.  HENT:  Negative for congestion and sore throat.   Eyes:  Negative for pain and discharge.  Respiratory:  Negative for cough and shortness of breath.   Cardiovascular:  Negative for chest pain and palpitations.  Gastrointestinal:  Negative for diarrhea, nausea and vomiting.  Endocrine: Negative for polydipsia and polyuria.  Genitourinary:  Negative for dysuria and hematuria.   Musculoskeletal:  Negative for neck pain and neck stiffness.  Skin:  Negative for rash.       Neck mass  Neurological:  Positive for tremors, weakness and numbness. Negative for dizziness and headaches.  Psychiatric/Behavioral:  Negative for agitation and behavioral problems.       Objective:    Physical Exam Vitals reviewed.  Constitutional:      General: He is not in acute distress.    Appearance: He is not diaphoretic.  HENT:     Head: Normocephalic and atraumatic.  Nose: Nose normal.     Mouth/Throat:     Mouth: Mucous membranes are moist.  Eyes:     General: No scleral icterus.    Extraocular Movements: Extraocular movements intact.  Neck:     Comments: Posterior neck mass - oval shaped, about 1 cm X 2 cm, nontender Cardiovascular:     Rate and Rhythm: Normal rate and regular rhythm.     Heart sounds: Normal heart sounds. No murmur heard. Pulmonary:     Breath sounds: Normal breath sounds. No wheezing or rales.  Musculoskeletal:     Cervical back: Neck supple. No tenderness.     Right lower leg: No edema.     Left lower leg: No edema.  Skin:    General: Skin is warm.     Findings: No rash.  Neurological:     General: No focal deficit present.     Mental Status: He is alert and oriented to person, place, and time.     Sensory: Sensory deficit (B/l feet) present.     Motor: Weakness (B/l LE - 4/5) present.     Comments: Resting tremors of right hand  Psychiatric:        Mood and Affect: Mood normal.        Behavior: Behavior normal.     BP 100/64 (BP Location: Right Arm, Patient Position: Sitting, Cuff Size: Large)   Pulse 84   Ht 5\' 7"  (1.702 m)   Wt 192 lb 9.6 oz (87.4 kg)   SpO2 92%   BMI 30.17 kg/m  Wt Readings from Last 3 Encounters:  08/29/23 192 lb 9.6 oz (87.4 kg)  07/25/23 199 lb (90.3 kg)  05/22/23 197 lb 12.8 oz (89.7 kg)    Lab Results  Component Value Date   TSH 5.880 (H) 05/22/2023   Lab Results  Component Value Date   WBC 8.2  05/22/2023   HGB 12.0 (L) 05/22/2023   HCT 36.2 (L) 05/22/2023   MCV 94 05/22/2023   PLT 178 05/22/2023   Lab Results  Component Value Date   NA 142 05/22/2023   K 4.2 05/22/2023   CO2 23 05/22/2023   GLUCOSE 137 (H) 05/22/2023   BUN 27 05/22/2023   CREATININE 2.45 (H) 05/22/2023   BILITOT 0.8 05/22/2023   ALKPHOS 79 05/22/2023   AST 11 05/22/2023   ALT 12 05/22/2023   PROT 7.7 05/22/2023   ALBUMIN 4.6 05/22/2023   CALCIUM 9.5 05/22/2023   ANIONGAP 9 08/07/2021   EGFR 27 (L) 05/22/2023   Lab Results  Component Value Date   CHOL 118 05/22/2023   Lab Results  Component Value Date   HDL 35 (L) 05/22/2023   Lab Results  Component Value Date   LDLCALC 63 05/22/2023   Lab Results  Component Value Date   TRIG 106 05/22/2023   Lab Results  Component Value Date   CHOLHDL 3.4 05/22/2023   Lab Results  Component Value Date   HGBA1C 10.2 (H) 05/22/2023      Assessment & Plan:   Problem List Items Addressed This Visit       Cardiovascular and Mediastinum   Hypertension    BP Readings from Last 1 Encounters:  08/29/23 100/64   Well-controlled with Amlodipine 10 mg QD and olmesartan 5 mg QD Was on Losartan, was switched to Amlodipine recently due to AKI Counseled for compliance with the medications Advised DASH diet and moderate exercise/walking as tolerated        Endocrine  Diabetes mellitus (HCC) - Primary    Lab Results  Component Value Date   HGBA1C 10.2 (H) 05/22/2023   Uncontrolled due to diet noncompliance On Glimepiride 2 mg BID - increased dose to 4 mg twice daily On Ozempic 1 mg qw, increased dose to 2 mg qw Started Lantus 10 units nightly Was on metformin, which was discontinued recently due to AKI Advised to follow diabetic diet, diet material provided Advised to check blood glucose regularly and bring the log in the next visit-contact if blood glucose more than 300 or less than 70 On statin  F/u BMP and HbA1C Diabetic eye exam: Advised  to follow up with Ophthalmology for diabetic eye exam  On gabapentin 300 mg 3 times daily for diabetic neuropathy      Relevant Medications   Semaglutide, 2 MG/DOSE, 8 MG/3ML SOPN   insulin glargine (LANTUS SOLOSTAR) 100 UNIT/ML Solostar Pen   Insulin Pen Needle (PEN NEEDLES) 32G X 4 MM MISC     Genitourinary   Stage 4 chronic kidney disease (HCC)    Last BMP reviewed Followed by Nephrology Avoid nephrotoxic agents Advised to maintain adequate hydration       Meds ordered this encounter  Medications   Semaglutide, 2 MG/DOSE, 8 MG/3ML SOPN    Sig: Inject 2 mg as directed once a week. Dose change - 08/29/23    Dispense:  3 mL    Refill:  3   insulin glargine (LANTUS SOLOSTAR) 100 UNIT/ML Solostar Pen    Sig: Inject 10 Units into the skin daily.    Dispense:  3 mL    Refill:  0   Insulin Pen Needle (PEN NEEDLES) 32G X 4 MM MISC    Sig: Use as directed for injecting insulin    Dispense:  100 each    Refill:  2    Follow-up: Return if symptoms worsen or fail to improve.    Anabel Halon, MD

## 2023-09-07 DIAGNOSIS — E1122 Type 2 diabetes mellitus with diabetic chronic kidney disease: Secondary | ICD-10-CM | POA: Diagnosis not present

## 2023-09-07 DIAGNOSIS — D638 Anemia in other chronic diseases classified elsewhere: Secondary | ICD-10-CM | POA: Diagnosis not present

## 2023-09-07 DIAGNOSIS — R809 Proteinuria, unspecified: Secondary | ICD-10-CM | POA: Diagnosis not present

## 2023-09-07 DIAGNOSIS — E875 Hyperkalemia: Secondary | ICD-10-CM | POA: Diagnosis not present

## 2023-09-07 DIAGNOSIS — I129 Hypertensive chronic kidney disease with stage 1 through stage 4 chronic kidney disease, or unspecified chronic kidney disease: Secondary | ICD-10-CM | POA: Diagnosis not present

## 2023-09-07 DIAGNOSIS — E1129 Type 2 diabetes mellitus with other diabetic kidney complication: Secondary | ICD-10-CM | POA: Diagnosis not present

## 2023-09-07 DIAGNOSIS — N189 Chronic kidney disease, unspecified: Secondary | ICD-10-CM | POA: Diagnosis not present

## 2023-09-07 DIAGNOSIS — N2581 Secondary hyperparathyroidism of renal origin: Secondary | ICD-10-CM | POA: Diagnosis not present

## 2023-09-07 DIAGNOSIS — N170 Acute kidney failure with tubular necrosis: Secondary | ICD-10-CM | POA: Diagnosis not present

## 2023-09-10 ENCOUNTER — Other Ambulatory Visit: Payer: Self-pay

## 2023-09-10 ENCOUNTER — Encounter (HOSPITAL_COMMUNITY): Payer: Self-pay | Admitting: *Deleted

## 2023-09-10 ENCOUNTER — Ambulatory Visit: Payer: Self-pay | Admitting: Internal Medicine

## 2023-09-10 ENCOUNTER — Emergency Department (HOSPITAL_COMMUNITY)
Admission: EM | Admit: 2023-09-10 | Discharge: 2023-09-10 | Discharge status: Against Medical Advice | Payer: Medicare HMO | Source: Home / Self Care

## 2023-09-10 DIAGNOSIS — Z823 Family history of stroke: Secondary | ICD-10-CM | POA: Diagnosis not present

## 2023-09-10 DIAGNOSIS — Z7984 Long term (current) use of oral hypoglycemic drugs: Secondary | ICD-10-CM | POA: Diagnosis not present

## 2023-09-10 DIAGNOSIS — Z833 Family history of diabetes mellitus: Secondary | ICD-10-CM | POA: Diagnosis not present

## 2023-09-10 DIAGNOSIS — I959 Hypotension, unspecified: Secondary | ICD-10-CM | POA: Diagnosis not present

## 2023-09-10 DIAGNOSIS — E785 Hyperlipidemia, unspecified: Secondary | ICD-10-CM | POA: Diagnosis present

## 2023-09-10 DIAGNOSIS — Z8673 Personal history of transient ischemic attack (TIA), and cerebral infarction without residual deficits: Secondary | ICD-10-CM | POA: Diagnosis not present

## 2023-09-10 DIAGNOSIS — R4182 Altered mental status, unspecified: Secondary | ICD-10-CM | POA: Diagnosis not present

## 2023-09-10 DIAGNOSIS — R0602 Shortness of breath: Secondary | ICD-10-CM | POA: Diagnosis not present

## 2023-09-10 DIAGNOSIS — Z794 Long term (current) use of insulin: Secondary | ICD-10-CM | POA: Diagnosis not present

## 2023-09-10 DIAGNOSIS — E1142 Type 2 diabetes mellitus with diabetic polyneuropathy: Secondary | ICD-10-CM | POA: Diagnosis present

## 2023-09-10 DIAGNOSIS — R0689 Other abnormalities of breathing: Secondary | ICD-10-CM | POA: Diagnosis not present

## 2023-09-10 DIAGNOSIS — Z5901 Sheltered homelessness: Secondary | ICD-10-CM | POA: Diagnosis not present

## 2023-09-10 DIAGNOSIS — I5033 Acute on chronic diastolic (congestive) heart failure: Secondary | ICD-10-CM | POA: Diagnosis not present

## 2023-09-10 DIAGNOSIS — R410 Disorientation, unspecified: Secondary | ICD-10-CM | POA: Diagnosis not present

## 2023-09-10 DIAGNOSIS — F17210 Nicotine dependence, cigarettes, uncomplicated: Secondary | ICD-10-CM | POA: Diagnosis present

## 2023-09-10 DIAGNOSIS — I6782 Cerebral ischemia: Secondary | ICD-10-CM | POA: Diagnosis not present

## 2023-09-10 DIAGNOSIS — Z5321 Procedure and treatment not carried out due to patient leaving prior to being seen by health care provider: Secondary | ICD-10-CM | POA: Insufficient documentation

## 2023-09-10 DIAGNOSIS — J189 Pneumonia, unspecified organism: Secondary | ICD-10-CM | POA: Diagnosis not present

## 2023-09-10 DIAGNOSIS — E1165 Type 2 diabetes mellitus with hyperglycemia: Secondary | ICD-10-CM | POA: Insufficient documentation

## 2023-09-10 DIAGNOSIS — I130 Hypertensive heart and chronic kidney disease with heart failure and stage 1 through stage 4 chronic kidney disease, or unspecified chronic kidney disease: Secondary | ICD-10-CM | POA: Diagnosis not present

## 2023-09-10 DIAGNOSIS — G9341 Metabolic encephalopathy: Secondary | ICD-10-CM | POA: Diagnosis not present

## 2023-09-10 DIAGNOSIS — N184 Chronic kidney disease, stage 4 (severe): Secondary | ICD-10-CM | POA: Diagnosis not present

## 2023-09-10 DIAGNOSIS — E870 Hyperosmolality and hypernatremia: Secondary | ICD-10-CM | POA: Diagnosis not present

## 2023-09-10 DIAGNOSIS — R531 Weakness: Secondary | ICD-10-CM | POA: Diagnosis not present

## 2023-09-10 DIAGNOSIS — R739 Hyperglycemia, unspecified: Secondary | ICD-10-CM | POA: Diagnosis not present

## 2023-09-10 DIAGNOSIS — I5031 Acute diastolic (congestive) heart failure: Secondary | ICD-10-CM | POA: Diagnosis not present

## 2023-09-10 DIAGNOSIS — R000 Tachycardia, unspecified: Secondary | ICD-10-CM | POA: Diagnosis not present

## 2023-09-10 DIAGNOSIS — E1122 Type 2 diabetes mellitus with diabetic chronic kidney disease: Secondary | ICD-10-CM | POA: Diagnosis not present

## 2023-09-10 DIAGNOSIS — J440 Chronic obstructive pulmonary disease with acute lower respiratory infection: Secondary | ICD-10-CM | POA: Diagnosis not present

## 2023-09-10 DIAGNOSIS — I10 Essential (primary) hypertension: Secondary | ICD-10-CM | POA: Diagnosis not present

## 2023-09-10 DIAGNOSIS — Z1152 Encounter for screening for COVID-19: Secondary | ICD-10-CM | POA: Diagnosis not present

## 2023-09-10 DIAGNOSIS — I700 Atherosclerosis of aorta: Secondary | ICD-10-CM | POA: Diagnosis not present

## 2023-09-10 DIAGNOSIS — R079 Chest pain, unspecified: Secondary | ICD-10-CM | POA: Diagnosis not present

## 2023-09-10 DIAGNOSIS — Z79899 Other long term (current) drug therapy: Secondary | ICD-10-CM | POA: Diagnosis not present

## 2023-09-10 LAB — CBC
HCT: 42.5 % (ref 39.0–52.0)
Hemoglobin: 13.8 g/dL (ref 13.0–17.0)
MCH: 31.2 pg (ref 26.0–34.0)
MCHC: 32.5 g/dL (ref 30.0–36.0)
MCV: 95.9 fL (ref 80.0–100.0)
Platelets: 239 10*3/uL (ref 150–400)
RBC: 4.43 MIL/uL (ref 4.22–5.81)
RDW: 12.3 % (ref 11.5–15.5)
WBC: 12.6 10*3/uL — ABNORMAL HIGH (ref 4.0–10.5)
nRBC: 0 % (ref 0.0–0.2)

## 2023-09-10 LAB — BASIC METABOLIC PANEL
Anion gap: 15 (ref 5–15)
BUN: 42 mg/dL — ABNORMAL HIGH (ref 8–23)
CO2: 30 mmol/L (ref 22–32)
Calcium: 10.4 mg/dL — ABNORMAL HIGH (ref 8.9–10.3)
Chloride: 99 mmol/L (ref 98–111)
Creatinine, Ser: 2.4 mg/dL — ABNORMAL HIGH (ref 0.61–1.24)
GFR, Estimated: 28 mL/min — ABNORMAL LOW (ref >60)
Glucose, Bld: 328 mg/dL — ABNORMAL HIGH (ref 70–99)
Potassium: 4.5 mmol/L (ref 3.5–5.1)
Sodium: 144 mmol/L (ref 135–145)

## 2023-09-10 LAB — CBG MONITORING, ED: Glucose-Capillary: 311 mg/dL — ABNORMAL HIGH (ref 70–99)

## 2023-09-10 NOTE — Telephone Encounter (Signed)
Patient did go to ER as instructed

## 2023-09-10 NOTE — Telephone Encounter (Signed)
Copied from CRM [id number]. Topic: Clinical - Red Word Triage >> Sep 10, 2023  1:24 PM Fonda Kinder J wrote: Red Word that prompted transfer to Nurse Triage: numbness/stroke

## 2023-09-10 NOTE — Telephone Encounter (Signed)
Copied from CRM [id number]. Topic: Clinical - Red Word Triage >> Sep 10, 2023  1:23 PM Alvino Blood C wrote: Red Word that prompted transfer to Nurse Triage: Pt's current blood sugar reading is at 321. PT is sluggish in response, with ow grade fever and clamy  Metformin 500mg  Pt took  Chief Complaint: elevated blood glucose level Symptoms: slow to respond; bgl is 321. Frequency: just started Pertinent Negatives: NA Disposition: [x] ED /[] Urgent Care (no appt availability in office) / [] Appointment(In office/virtual)/ []  [facility]/ [] Home Care/ [] Refused Recommended Disposition /[] [facility]/ []  Follow-up with PCP Additional Notes: Nurse called from facility to report patient's bgl elevated at 321 and that he took half of a metformin (250 mg) that he is not ordered.  Nurse states he is slow to respond and not acting usual.  Instructed to call 911.   Reason for Disposition  Unconscious or difficult to awaken  Answer Assessment - Initial Assessment Questions 1. BLOOD GLUCOSE: "What is your blood glucose level?"     321 2. ONSET: "When did you check the blood glucose?"     Today.  Protocols used: Diabetes - High Blood Sugar-A-AH

## 2023-09-10 NOTE — ED Notes (Signed)
Looked for pt multiple times without seeing him in waiting room

## 2023-09-10 NOTE — ED Triage Notes (Signed)
Pt BIB [facility] for generalized weakness and elevated blood sugar, EMS reported CBG 309.  Pt with type 2 DM. Pt alert and oriented to place, name but not to month or year.

## 2023-09-10 NOTE — ED Notes (Signed)
Called.  Not in lobby

## 2023-09-11 ENCOUNTER — Emergency Department (HOSPITAL_COMMUNITY): Payer: Medicare HMO

## 2023-09-11 ENCOUNTER — Encounter (HOSPITAL_COMMUNITY): Payer: Self-pay

## 2023-09-11 ENCOUNTER — Inpatient Hospital Stay (HOSPITAL_COMMUNITY)
Admission: EM | Admit: 2023-09-11 | Discharge: 2023-09-13 | DRG: 070 | Disposition: A | Discharge status: Home Health | Payer: Medicare HMO | Attending: Family Medicine | Admitting: Family Medicine

## 2023-09-11 ENCOUNTER — Telehealth: Payer: Self-pay

## 2023-09-11 ENCOUNTER — Other Ambulatory Visit: Payer: Self-pay

## 2023-09-11 DIAGNOSIS — R000 Tachycardia, unspecified: Secondary | ICD-10-CM | POA: Diagnosis present

## 2023-09-11 DIAGNOSIS — I5033 Acute on chronic diastolic (congestive) heart failure: Secondary | ICD-10-CM | POA: Diagnosis present

## 2023-09-11 DIAGNOSIS — E785 Hyperlipidemia, unspecified: Secondary | ICD-10-CM | POA: Diagnosis present

## 2023-09-11 DIAGNOSIS — G9341 Metabolic encephalopathy: Principal | ICD-10-CM | POA: Diagnosis present

## 2023-09-11 DIAGNOSIS — E1142 Type 2 diabetes mellitus with diabetic polyneuropathy: Secondary | ICD-10-CM | POA: Diagnosis present

## 2023-09-11 DIAGNOSIS — E1165 Type 2 diabetes mellitus with hyperglycemia: Secondary | ICD-10-CM

## 2023-09-11 DIAGNOSIS — J440 Chronic obstructive pulmonary disease with acute lower respiratory infection: Secondary | ICD-10-CM | POA: Diagnosis present

## 2023-09-11 DIAGNOSIS — Z7984 Long term (current) use of oral hypoglycemic drugs: Secondary | ICD-10-CM | POA: Diagnosis not present

## 2023-09-11 DIAGNOSIS — Z8673 Personal history of transient ischemic attack (TIA), and cerebral infarction without residual deficits: Secondary | ICD-10-CM | POA: Diagnosis not present

## 2023-09-11 DIAGNOSIS — Z823 Family history of stroke: Secondary | ICD-10-CM | POA: Diagnosis not present

## 2023-09-11 DIAGNOSIS — E1122 Type 2 diabetes mellitus with diabetic chronic kidney disease: Secondary | ICD-10-CM | POA: Diagnosis present

## 2023-09-11 DIAGNOSIS — Z794 Long term (current) use of insulin: Secondary | ICD-10-CM

## 2023-09-11 DIAGNOSIS — Z1152 Encounter for screening for COVID-19: Secondary | ICD-10-CM | POA: Diagnosis not present

## 2023-09-11 DIAGNOSIS — Z79899 Other long term (current) drug therapy: Secondary | ICD-10-CM

## 2023-09-11 DIAGNOSIS — R4182 Altered mental status, unspecified: Secondary | ICD-10-CM | POA: Diagnosis present

## 2023-09-11 DIAGNOSIS — R410 Disorientation, unspecified: Secondary | ICD-10-CM | POA: Diagnosis not present

## 2023-09-11 DIAGNOSIS — N184 Chronic kidney disease, stage 4 (severe): Secondary | ICD-10-CM | POA: Diagnosis present

## 2023-09-11 DIAGNOSIS — E870 Hyperosmolality and hypernatremia: Secondary | ICD-10-CM | POA: Diagnosis not present

## 2023-09-11 DIAGNOSIS — I5032 Chronic diastolic (congestive) heart failure: Secondary | ICD-10-CM | POA: Diagnosis present

## 2023-09-11 DIAGNOSIS — Z833 Family history of diabetes mellitus: Secondary | ICD-10-CM

## 2023-09-11 DIAGNOSIS — J189 Pneumonia, unspecified organism: Principal | ICD-10-CM | POA: Diagnosis present

## 2023-09-11 DIAGNOSIS — E119 Type 2 diabetes mellitus without complications: Secondary | ICD-10-CM

## 2023-09-11 DIAGNOSIS — I5031 Acute diastolic (congestive) heart failure: Secondary | ICD-10-CM | POA: Diagnosis not present

## 2023-09-11 DIAGNOSIS — R0602 Shortness of breath: Secondary | ICD-10-CM | POA: Diagnosis not present

## 2023-09-11 DIAGNOSIS — R531 Weakness: Secondary | ICD-10-CM | POA: Diagnosis present

## 2023-09-11 DIAGNOSIS — Z5901 Sheltered homelessness: Secondary | ICD-10-CM

## 2023-09-11 DIAGNOSIS — F17210 Nicotine dependence, cigarettes, uncomplicated: Secondary | ICD-10-CM | POA: Diagnosis present

## 2023-09-11 DIAGNOSIS — R079 Chest pain, unspecified: Secondary | ICD-10-CM | POA: Diagnosis not present

## 2023-09-11 DIAGNOSIS — R0689 Other abnormalities of breathing: Secondary | ICD-10-CM | POA: Diagnosis not present

## 2023-09-11 DIAGNOSIS — I6782 Cerebral ischemia: Secondary | ICD-10-CM | POA: Diagnosis not present

## 2023-09-11 DIAGNOSIS — I10 Essential (primary) hypertension: Secondary | ICD-10-CM | POA: Diagnosis present

## 2023-09-11 DIAGNOSIS — I130 Hypertensive heart and chronic kidney disease with heart failure and stage 1 through stage 4 chronic kidney disease, or unspecified chronic kidney disease: Secondary | ICD-10-CM | POA: Diagnosis present

## 2023-09-11 DIAGNOSIS — I700 Atherosclerosis of aorta: Secondary | ICD-10-CM | POA: Diagnosis not present

## 2023-09-11 DIAGNOSIS — R739 Hyperglycemia, unspecified: Secondary | ICD-10-CM | POA: Diagnosis not present

## 2023-09-11 DIAGNOSIS — E782 Mixed hyperlipidemia: Secondary | ICD-10-CM

## 2023-09-11 DIAGNOSIS — R911 Solitary pulmonary nodule: Secondary | ICD-10-CM

## 2023-09-11 DIAGNOSIS — I959 Hypotension, unspecified: Secondary | ICD-10-CM | POA: Diagnosis not present

## 2023-09-11 LAB — COMPREHENSIVE METABOLIC PANEL
ALT: 26 U/L (ref 0–44)
AST: 57 U/L — ABNORMAL HIGH (ref 15–41)
Albumin: 4.4 g/dL (ref 3.5–5.0)
Alkaline Phosphatase: 69 U/L (ref 38–126)
Anion gap: 15 (ref 5–15)
BUN: 46 mg/dL — ABNORMAL HIGH (ref 8–23)
CO2: 32 mmol/L (ref 22–32)
Calcium: 11 mg/dL — ABNORMAL HIGH (ref 8.9–10.3)
Chloride: 97 mmol/L — ABNORMAL LOW (ref 98–111)
Creatinine, Ser: 2.43 mg/dL — ABNORMAL HIGH (ref 0.61–1.24)
GFR, Estimated: 27 mL/min — ABNORMAL LOW (ref >60)
Glucose, Bld: 310 mg/dL — ABNORMAL HIGH (ref 70–99)
Potassium: 3.7 mmol/L (ref 3.5–5.1)
Sodium: 144 mmol/L (ref 135–145)
Total Bilirubin: 1 mg/dL (ref <1.2)
Total Protein: 8.2 g/dL — ABNORMAL HIGH (ref 6.5–8.1)

## 2023-09-11 LAB — ETHANOL: Alcohol, Ethyl (B): <10 mg/dL (ref <10)

## 2023-09-11 LAB — BETA-HYDROXYBUTYRIC ACID: Beta-Hydroxybutyric Acid: 0.42 mmol/L — ABNORMAL HIGH (ref 0.05–0.27)

## 2023-09-11 LAB — CBC
HCT: 46 % (ref 39.0–52.0)
Hemoglobin: 15.1 g/dL (ref 13.0–17.0)
MCH: 31.2 pg (ref 26.0–34.0)
MCHC: 32.8 g/dL (ref 30.0–36.0)
MCV: 95 fL (ref 80.0–100.0)
Platelets: 200 10*3/uL (ref 150–400)
RBC: 4.84 MIL/uL (ref 4.22–5.81)
RDW: 12.1 % (ref 11.5–15.5)
WBC: 15.3 10*3/uL — ABNORMAL HIGH (ref 4.0–10.5)
nRBC: 0 % (ref 0.0–0.2)

## 2023-09-11 LAB — PROTIME-INR
INR: 1 (ref 0.8–1.2)
Prothrombin Time: 13.9 s (ref 11.4–15.2)

## 2023-09-11 LAB — RESP PANEL BY RT-PCR (RSV, FLU A&B, COVID)  RVPGX2
Influenza A by PCR: NEGATIVE
Influenza B by PCR: NEGATIVE
Resp Syncytial Virus by PCR: NEGATIVE
SARS Coronavirus 2 by RT PCR: NEGATIVE

## 2023-09-11 LAB — TROPONIN I (HIGH SENSITIVITY)
Troponin I (High Sensitivity): 50 ng/L — ABNORMAL HIGH (ref <18)
Troponin I (High Sensitivity): 42 ng/L — ABNORMAL HIGH (ref <18)

## 2023-09-11 LAB — D-DIMER, QUANTITATIVE: D-Dimer, Quant: 0.98 ug{FEU}/mL — ABNORMAL HIGH (ref 0.00–0.50)

## 2023-09-11 LAB — CBG MONITORING, ED: Glucose-Capillary: 317 mg/dL — ABNORMAL HIGH (ref 70–99)

## 2023-09-11 LAB — BRAIN NATRIURETIC PEPTIDE: B Natriuretic Peptide: 763 pg/mL — ABNORMAL HIGH (ref 0.0–100.0)

## 2023-09-11 MED ORDER — ACETAMINOPHEN 500 MG PO TABS: 500.0000 mg | ORAL_TABLET | Freq: Four times a day (QID) | ORAL | Status: DC | PRN

## 2023-09-11 MED ORDER — SODIUM CHLORIDE 0.9 % IV SOLN
500.0000 mg | INTRAVENOUS | Status: DC
  Administered 2023-09-11: 500 mg via INTRAVENOUS
  Filled 2023-09-11: qty 5

## 2023-09-11 MED ORDER — SODIUM CHLORIDE 0.9 % IV SOLN
2.0000 g | Freq: Once | INTRAVENOUS | Status: AC
  Administered 2023-09-11: 2 g via INTRAVENOUS
  Filled 2023-09-11: qty 20

## 2023-09-11 MED ORDER — POLYETHYLENE GLYCOL 3350 17 G PO PACK: 17.0000 g | PACK | Freq: Every day | ORAL | Status: DC | PRN

## 2023-09-11 MED ORDER — SODIUM CHLORIDE 0.9 % IV BOLUS
1000.0000 mL | Freq: Once | INTRAVENOUS | Status: AC
  Administered 2023-09-12: 1000 mL via INTRAVENOUS

## 2023-09-11 MED ORDER — INSULIN ASPART 100 UNIT/ML IJ SOLN
0.0000 [IU] | Freq: Every day | INTRAMUSCULAR | Status: DC
  Administered 2023-09-12: 4 [IU] via SUBCUTANEOUS
  Filled 2023-09-11: qty 1

## 2023-09-11 MED ORDER — ENOXAPARIN SODIUM 30 MG/0.3ML IJ SOSY
30.0000 mg | PREFILLED_SYRINGE | INTRAMUSCULAR | Status: DC
  Administered 2023-09-12 – 2023-09-13 (×2): 30 mg via SUBCUTANEOUS
  Filled 2023-09-11 (×2): qty 0.3

## 2023-09-11 MED ORDER — INSULIN ASPART 100 UNIT/ML IJ SOLN
0.0000 [IU] | Freq: Three times a day (TID) | INTRAMUSCULAR | Status: DC
  Administered 2023-09-12 (×2): 2 [IU] via SUBCUTANEOUS
  Administered 2023-09-12: 3 [IU] via SUBCUTANEOUS
  Administered 2023-09-13: 2 [IU] via SUBCUTANEOUS

## 2023-09-11 MED ORDER — PROCHLORPERAZINE EDISYLATE 10 MG/2ML IJ SOLN: 5.0000 mg | Freq: Four times a day (QID) | INTRAMUSCULAR | Status: DC | PRN

## 2023-09-11 MED ORDER — MELATONIN 3 MG PO TABS: 6.0000 mg | ORAL_TABLET | Freq: Every evening | ORAL | Status: DC | PRN

## 2023-09-11 NOTE — ED Triage Notes (Signed)
Pt arrives via [facility] from [facility] where he lives. Staff reports patient has been there for 4 years and were concerned because pt seemed short of breath today and confused. Pt endorses being at ED yesterday and leaving before being seen. Pt is diabetic with CBG with EMS 332. EMS reports someone on scene said he was supposed to start insulin but has not yet. Pt A&Ox1 with EMS. Tachy at 120's, BP 120/70, 96% RA.

## 2023-09-11 NOTE — ED Provider Notes (Signed)
[facility] EMERGENCY DEPARTMENT AT [facility] Provider Note   CSN: [id number] Arrival date & time: 09/11/23  1808     History {Add pertinent medical, surgical, social history, OB history to HPI:1} Chief Complaint  Patient presents with   Shortness of Breath    Collin Yoder is a 72 y.o. male.  72 year old male with a history of insulin-dependent diabetes, thalamic stroke, and lung mass who presents emergency department with shortness of breath and confusion.  Per EMS, patient came in from [facility] where someone there called for shortness of breath and confusion.  Blood sugar was elevated to 332.  Patient unable to provide additional history at this time.  Does appear to be confused.  Denies any alcohol use to me or drug use.  Says he is not sure why he is here.  Attempted to call [facility] and as well as his 2 emergency contacts without success.       Home Medications Prior to Admission medications   Medication Sig Start Date End Date Taking? Authorizing Provider  acetaminophen (TYLENOL) 325 MG tablet Take 2 tablets (650 mg total) by mouth every 6 (six) hours as needed for mild pain (or Fever >/= 101). 08/07/21   Shon Hale, MD  Alcohol Swabs (B-D SINGLE USE SWABS REGULAR) PADS 1 Units by Does not apply route daily at 12 noon. 10/26/21   Anabel Halon, MD  amLODipine (NORVASC) 10 MG tablet TAKE ONE TABLET BY MOUTH ONCE DAILY. 08/06/23   Anabel Halon, MD  atorvastatin (LIPITOR) 40 MG tablet Take 1 tablet (40 mg total) by mouth daily. 01/18/23   Anabel Halon, MD  Blood Glucose Monitoring Suppl (TRUE METRIX METER) w/Device KIT 1 kit by Does not apply route See admin instructions. 05/22/23   Anabel Halon, MD  Cholecalciferol (VITAMIN D3) 5000 units CAPS Take 1 capsule by mouth daily.    [provider]  gabapentin (NEURONTIN) 300 MG capsule Take 1 capsule (300 mg total) by mouth 3 (three) times daily. 06/21/23   Anabel Halon, MD  glimepiride  (AMARYL) 4 MG tablet Take 1 tablet (4 mg total) by mouth 2 (two) times daily before a meal. 05/23/23   Allena Katz, Earlie Lou, MD  glucose blood (TRUE METRIX BLOOD GLUCOSE TEST) test strip Use as instructed 10/26/21   Anabel Halon, MD  insulin glargine (LANTUS SOLOSTAR) 100 UNIT/ML Solostar Pen Inject 10 Units into the skin daily. 08/29/23   Anabel Halon, MD  Insulin Pen Needle (PEN NEEDLES) 32G X 4 MM MISC Use as directed for injecting insulin 08/29/23   Anabel Halon, MD  olmesartan (BENICAR) 5 MG tablet Take 5 mg by mouth daily.    [provider]  Semaglutide, 2 MG/DOSE, 8 MG/3ML SOPN Inject 2 mg as directed once a week. Dose change - 08/29/23 08/29/23   Anabel Halon, MD  TRUEplus Lancets 33G MISC USE TO TEST DAILY AT 12 NOON. 07/12/22   Anabel Halon, MD      Allergies    Patient has no known allergies.    Review of Systems   Review of Systems  Physical Exam Updated Vital Signs BP 111/73 (BP Location: Left Arm)   Pulse (!) 116   Temp 98.9 F (37.2 C) (Oral)   Resp 16   Ht 5\' 7"  (1.702 m)   Wt 87.1 kg   SpO2 91%   BMI 30.07 kg/m  Physical Exam Vitals and nursing note reviewed.  Constitutional:  General: He is not in acute distress.    Appearance: He is well-developed.  HENT:     Head: Normocephalic and atraumatic.     Right Ear: External ear normal.     Left Ear: External ear normal.     Nose: Nose normal.  Eyes:     Extraocular Movements: Extraocular movements intact.     Conjunctiva/sclera: Conjunctivae normal.     Pupils: Pupils are equal, round, and reactive to light.  Cardiovascular:     Rate and Rhythm: Regular rhythm. Tachycardia present.     Heart sounds: Normal heart sounds.  Pulmonary:     Effort: Pulmonary effort is normal. No respiratory distress.     Breath sounds: Normal breath sounds.  Abdominal:     Palpations: There is no mass.  Musculoskeletal:     Cervical back: Normal range of motion and neck supple.     Right lower leg: No  edema.     Left lower leg: No edema.  Skin:    General: Skin is warm and dry.  Neurological:     Mental Status: He is alert.     Cranial Nerves: No cranial nerve deficit.     Sensory: No sensory deficit.     Motor: No weakness.     Comments: Alert and oriented to self only  Psychiatric:        Mood and Affect: Mood normal.        Behavior: Behavior normal.     ED Results / Procedures / Treatments   Labs (all labs ordered are listed, but only abnormal results are displayed) Labs Reviewed - No data to display  EKG None  Radiology No results found.  Procedures Procedures  {Document cardiac monitor, telemetry assessment procedure when appropriate:1}  Medications Ordered in ED Medications - No data to display  ED Course/ Medical Decision Making/ A&P   {   Click here for ABCD2, HEART and other calculatorsREFRESH Note before signing :1}                              Medical Decision Making Amount and/or Complexity of Data Reviewed Labs: ordered. Radiology: ordered.   ***  {Document critical care time when appropriate:1} {Document review of labs and clinical decision tools ie heart score, Chads2Vasc2 etc:1}  {Document your independent review of radiology images, and any outside records:1} {Document your discussion with family members, caretakers, and with consultants:1} {Document social determinants of health affecting pt's care:1} {Document your decision making why or why not admission, treatments were needed:1} Final Clinical Impression(s) / ED Diagnoses Final diagnoses:  None    Rx / DC Orders ED Discharge Orders     None

## 2023-09-11 NOTE — Transitions of Care (Post Inpatient/ED Visit) (Unsigned)
   09/11/2023  Name: Collin Yoder MRN: [id number] DOB: July 23, 1951  Today's TOC FU Call Status: Today's TOC FU Call Status:: Unsuccessful Call (1st Attempt) Unsuccessful Call (1st Attempt) Date: 09/11/23  Attempted to reach the patient regarding the most recent Inpatient/ED visit.  Follow Up Plan: Additional outreach attempts will be made to reach the patient to complete the Transitions of Care (Post Inpatient/ED visit) call.    Aylissa Heinemann, CMA  [facility] AWV Team Direct Dial: [phone]

## 2023-09-11 NOTE — H&P (Incomplete)
History and Physical  Collin Yoder JSE:[id number] DOB: 1951-06-01 DOA: 09/11/2023  Referring physician: Dr. Eloise Harman, EDP  PCP: Anabel Halon, MD  Outpatient Specialists: None Patient coming from: Home (lives in [place]-4 years)  Chief Complaint: Altered mental status.  HPI: Collin Yoder is a 72 y.o. male with medical history significant for type 2 diabetes, diabetic neuropathy, prior CVA, hypertension, COPD, tobacco abuse, who presented from [name] to the ED via EMS due to altered mental status.  Reportedly the patient had complained of shortness of breath.  In the ED, alert and confused, oriented to self only.  Tachycardic with elevated temperature 99.2.  Lab work notable for elevated BNP greater than 760, elevated troponin 50, 42, elevated D-dimer 0.98, elevated creatinine, close to baseline.  Noncontrast head CT was nonacute.  No active disease on chest x-ray.  Due to concern for possible acute pulm embolism VQ scan was ordered and is pending.  Since pneumonia could not be ruled out at this time, the patient was started on empiric IV antibiotics for CAP.  Admitted by [facility], hospitalist service.  ED Course: Tmax 99.2.  Blood pressure 111/73, heart rate 116, respiratory 16, O2 saturation 91% on room air.  Review of Systems: Review of systems as noted in the HPI. All other systems reviewed and are negative.   Past Medical History:  Diagnosis Date   Borderline diabetes    Peripheral neuropathy 07/15/2019   Right lower lobe lung mass 04/11/2017   Thalamic stroke (HCC) 07/15/2019   Type II diabetes mellitus, uncontrolled 07/15/2019   Vitamin D deficiency disease 07/15/2019   Past Surgical History:  Procedure Laterality Date   APPENDECTOMY     CATARACT EXTRACTION, BILATERAL Bilateral 01/2021    Social History:  reports that he has been smoking cigarettes. He has a 45 pack-year smoking history. He has never used smokeless tobacco. He reports current alcohol use  of about 1.0 standard drink of alcohol per week. He reports that he does not use drugs.   No Known Allergies  Family History  Problem Relation Age of Onset   Diabetes Mother 10   Stroke Mother    Colon cancer Neg Hx       Prior to Admission medications   Medication Sig Start Date End Date Taking? Authorizing Provider  acetaminophen (TYLENOL) 325 MG tablet Take 2 tablets (650 mg total) by mouth every 6 (six) hours as needed for mild pain (or Fever >/= 101). 08/07/21   Shon Hale, MD  Alcohol Swabs (B-D SINGLE USE SWABS REGULAR) PADS 1 Units by Does not apply route daily at 12 noon. 10/26/21   Anabel Halon, MD  amLODipine (NORVASC) 10 MG tablet TAKE ONE TABLET BY MOUTH ONCE DAILY. 08/06/23   Anabel Halon, MD  atorvastatin (LIPITOR) 40 MG tablet Take 1 tablet (40 mg total) by mouth daily. 01/18/23   Anabel Halon, MD  Blood Glucose Monitoring Suppl (TRUE METRIX METER) w/Device KIT 1 kit by Does not apply route See admin instructions. 05/22/23   Anabel Halon, MD  Cholecalciferol (VITAMIN D3) 5000 units CAPS Take 1 capsule by mouth daily.    [provider]  gabapentin (NEURONTIN) 300 MG capsule Take 1 capsule (300 mg total) by mouth 3 (three) times daily. 06/21/23   Anabel Halon, MD  glimepiride (AMARYL) 4 MG tablet Take 1 tablet (4 mg total) by mouth 2 (two) times daily before a meal. 05/23/23   Anabel Halon, MD  glucose blood (  TRUE METRIX BLOOD GLUCOSE TEST) test strip Use as instructed 10/26/21   Anabel Halon, MD  insulin glargine (LANTUS SOLOSTAR) 100 UNIT/ML Solostar Pen Inject 10 Units into the skin daily. 08/29/23   Anabel Halon, MD  Insulin Pen Needle (PEN NEEDLES) 32G X 4 MM MISC Use as directed for injecting insulin 08/29/23   Anabel Halon, MD  olmesartan (BENICAR) 5 MG tablet Take 5 mg by mouth daily.    [provider]  Semaglutide, 2 MG/DOSE, 8 MG/3ML SOPN Inject 2 mg as directed once a week. Dose change - 08/29/23 08/29/23   Anabel Halon, MD  TRUEplus Lancets 33G MISC USE TO TEST DAILY AT 12 NOON. 07/12/22   Anabel Halon, MD    Physical Exam: BP 111/73 (BP Location: Left Arm)   Pulse (!) 116   Temp 98.9 F (37.2 C) (Oral)   Resp 16   Ht 5\' 7"  (1.702 m)   Wt 87.1 kg   SpO2 91%   BMI 30.07 kg/m   General: 72 y.o. year-old male well developed well nourished in no acute distress.  Alert and oriented x3. Cardiovascular: Regular rate and rhythm with no rubs or gallops.  No thyromegaly or JVD noted.  No lower extremity edema. 2/4 pulses in all 4 extremities. Respiratory: Clear to auscultation with no wheezes or rales. Good inspiratory effort. Abdomen: Soft nontender nondistended with normal bowel sounds x4 quadrants. Muskuloskeletal: No cyanosis, clubbing or edema noted bilaterally Neuro: CN II-XII intact, strength, sensation, reflexes Skin: No ulcerative lesions noted or rashes Psychiatry: Judgement and insight appear normal. Mood is appropriate for condition and setting          Labs on Admission:  Basic Metabolic Panel: Recent Labs  Lab 09/10/23 1525 09/11/23 1929  NA 144 144  K 4.5 3.7  CL 99 97*  CO2 30 32  GLUCOSE 328* 310*  BUN 42* 46*  CREATININE 2.40* 2.43*  CALCIUM 10.4* 11.0*   Liver Function Tests: Recent Labs  Lab 09/11/23 1929  AST 57*  ALT 26  ALKPHOS 69  BILITOT 1.0  PROT 8.2*  ALBUMIN 4.4   No results for input(s): "LIPASE", "AMYLASE" in the last 168 hours. No results for input(s): "AMMONIA" in the last 168 hours. CBC: Recent Labs  Lab 09/10/23 1525 09/11/23 1929  WBC 12.6* 15.3*  HGB 13.8 15.1  HCT 42.5 46.0  MCV 95.9 95.0  PLT 239 200   Cardiac Enzymes: No results for input(s): "CKTOTAL", "CKMB", "CKMBINDEX", "TROPONINI" in the last 168 hours.  BNP (last 3 results) Recent Labs    09/11/23 1929  BNP 763.0*    ProBNP (last 3 results) No results for input(s): "PROBNP" in the last 8760 hours.  CBG: Recent Labs  Lab 09/10/23 1421 09/11/23 2120  GLUCAP  311* 317*    Radiological Exams on Admission: DG Chest Port 1 View  Result Date: 09/11/2023 CLINICAL DATA:  Shortness of breath EXAM: PORTABLE CHEST 1 VIEW COMPARISON:  11/22/2021 FINDINGS: Cardiac shadow is within normal limits. Aortic calcifications are noted. The lungs are well aerated bilaterally. No focal infiltrate or effusion is seen. No acute bony abnormality is noted. IMPRESSION: No active disease. Electronically Signed   By: Alcide Clever M.D.   On: 09/11/2023 19:25   CT Head Wo Contrast  Result Date: 09/11/2023 CLINICAL DATA:  Shortness of breath and confusion, initial encounter EXAM: CT HEAD WITHOUT CONTRAST TECHNIQUE: Contiguous axial images were obtained from the base of the skull through the vertex  without intravenous contrast. RADIATION DOSE REDUCTION: This exam was performed according to the departmental dose-optimization program which includes automated exposure control, adjustment of the mA and/or kV according to patient size and/or use of iterative reconstruction technique. COMPARISON:  01/27/2019 FINDINGS: Brain: No evidence of acute infarction, hemorrhage, hydrocephalus, extra-axial collection or mass lesion/mass effect. Chronic atrophic and ischemic changes are noted. Lacunar infarct is again seen in the left thalamus stable from the prior exam. Similar lacunar infarct in the left basal ganglia/subinsular cortex is noted as well. Vascular: No hyperdense vessel or unexpected calcification. Skull: Normal. Negative for fracture or focal lesion. Sinuses/Orbits: No acute finding. Other: None. IMPRESSION: Chronic atrophic and ischemic changes.  No acute abnormality noted. Electronically Signed   By: Alcide Clever M.D.   On: 09/11/2023 19:23    EKG: I independently viewed the EKG done and my findings are as followed:    Assessment/Plan Present on Admission:  AMS (altered mental status)  Principal Problem:   AMS (altered mental status)  Acute metabolic encephalopathy, unclear  etiology Alcohol, ethyl level negative History of CVA, noncontrast head CT was nonacute Obtain TSH and UA Treat underlying conditions Reorient as needed Fall and aspiration precaution    DVT prophylaxis: ***   Code Status: ***   Family Communication: ***   Disposition Plan: ***   Consults called: ***   Admission status: ***    Status is: Inpatient {Inpatient:23812}   Darlin Drop MD [facility] Pager [phone]  If 7PM-7AM, please contact night-coverage [email or web address] Password [facility]  09/11/2023, 11:39 PM

## 2023-09-12 ENCOUNTER — Inpatient Hospital Stay (HOSPITAL_COMMUNITY): Payer: Medicare HMO

## 2023-09-12 ENCOUNTER — Encounter (HOSPITAL_COMMUNITY): Payer: Self-pay | Admitting: Internal Medicine

## 2023-09-12 DIAGNOSIS — G9341 Metabolic encephalopathy: Secondary | ICD-10-CM

## 2023-09-12 DIAGNOSIS — I5033 Acute on chronic diastolic (congestive) heart failure: Secondary | ICD-10-CM | POA: Diagnosis not present

## 2023-09-12 DIAGNOSIS — I5031 Acute diastolic (congestive) heart failure: Secondary | ICD-10-CM | POA: Diagnosis not present

## 2023-09-12 DIAGNOSIS — R079 Chest pain, unspecified: Secondary | ICD-10-CM | POA: Diagnosis not present

## 2023-09-12 LAB — GLUCOSE, CAPILLARY
Glucose-Capillary: 209 mg/dL — ABNORMAL HIGH (ref 70–99)
Glucose-Capillary: 185 mg/dL — ABNORMAL HIGH (ref 70–99)
Glucose-Capillary: 250 mg/dL — ABNORMAL HIGH (ref 70–99)
Glucose-Capillary: 169 mg/dL — ABNORMAL HIGH (ref 70–99)

## 2023-09-12 LAB — LIPID PANEL
Cholesterol: 102 mg/dL (ref 0–200)
HDL: 31 mg/dL — ABNORMAL LOW (ref >40)
LDL Cholesterol: 45 mg/dL (ref 0–99)
Total CHOL/HDL Ratio: 3.3 {ratio}
Triglycerides: 129 mg/dL (ref <150)
VLDL: 26 mg/dL (ref 0–40)

## 2023-09-12 LAB — RAPID URINE DRUG SCREEN, HOSP PERFORMED
Amphetamines: NOT DETECTED
Barbiturates: NOT DETECTED
Benzodiazepines: NOT DETECTED
Cocaine: NOT DETECTED
Opiates: NOT DETECTED
Tetrahydrocannabinol: NOT DETECTED

## 2023-09-12 LAB — LACTIC ACID, PLASMA
Lactic Acid, Venous: 3 mmol/L (ref 0.5–1.9)
Lactic Acid, Venous: 3.7 mmol/L (ref 0.5–1.9)

## 2023-09-12 LAB — URINALYSIS, W/ REFLEX TO CULTURE (INFECTION SUSPECTED)
Bilirubin Urine: NEGATIVE
Glucose, UA: >=500 mg/dL — AB
Ketones, ur: NEGATIVE mg/dL
Leukocytes,Ua: NEGATIVE
Nitrite: NEGATIVE
Protein, ur: >=300 mg/dL — AB
Specific Gravity, Urine: 1.02 (ref 1.005–1.030)
pH: 7 (ref 5.0–8.0)

## 2023-09-12 LAB — ECHOCARDIOGRAM COMPLETE
Area-P 1/2: 5.16 cm2
Height: 67 in
S' Lateral: 2 cm
Weight: 3072 [oz_av]

## 2023-09-12 LAB — HEMOGLOBIN A1C
Hgb A1c MFr Bld: 11.4 % — ABNORMAL HIGH (ref 4.8–5.6)
Mean Plasma Glucose: 280.48 mg/dL

## 2023-09-12 LAB — TSH: TSH: 3.937 u[IU]/mL (ref 0.350–4.500)

## 2023-09-12 MED ORDER — FUROSEMIDE 10 MG/ML IJ SOLN
40.0000 mg | Freq: Every day | INTRAMUSCULAR | Status: DC
  Administered 2023-09-13: 40 mg via INTRAVENOUS
  Filled 2023-09-12: qty 4

## 2023-09-12 MED ORDER — SODIUM CHLORIDE 0.9 % IV SOLN
2.0000 g | INTRAVENOUS | Status: AC
  Administered 2023-09-12: 2 g via INTRAVENOUS
  Filled 2023-09-12: qty 20

## 2023-09-12 MED ORDER — SODIUM CHLORIDE 0.9 % IV SOLN
500.0000 mg | INTRAVENOUS | Status: AC
  Administered 2023-09-12: 500 mg via INTRAVENOUS
  Filled 2023-09-12: qty 5

## 2023-09-12 MED ORDER — LORAZEPAM 2 MG/ML IJ SOLN
1.0000 mg | INTRAMUSCULAR | Status: AC
  Administered 2023-09-12: 1 mg via INTRAVENOUS
  Filled 2023-09-12: qty 1

## 2023-09-12 MED ORDER — AMLODIPINE BESYLATE 5 MG PO TABS
10.0000 mg | ORAL_TABLET | Freq: Every day | ORAL | Status: DC
  Administered 2023-09-12 – 2023-09-13 (×2): 10 mg via ORAL
  Filled 2023-09-12 (×2): qty 2

## 2023-09-12 MED ORDER — ATORVASTATIN CALCIUM 40 MG PO TABS
40.0000 mg | ORAL_TABLET | Freq: Every day | ORAL | Status: DC
  Administered 2023-09-12 – 2023-09-13 (×2): 40 mg via ORAL
  Filled 2023-09-12 (×2): qty 1

## 2023-09-12 MED ORDER — TECHNETIUM TO 99M ALBUMIN AGGREGATED
4.0000 | Freq: Once | INTRAVENOUS | Status: AC | PRN
Start: 2023-09-12 — End: 2023-09-12
  Administered 2023-09-12: 4.4 via INTRAVENOUS

## 2023-09-12 MED ORDER — PERFLUTREN LIPID MICROSPHERE
1.0000 mL | INTRAVENOUS | Status: AC | PRN
Start: 2023-09-12 — End: 2023-09-12
  Administered 2023-09-12: 2 mL via INTRAVENOUS

## 2023-09-12 NOTE — Plan of Care (Signed)
  Problem: Skin Integrity: Goal: Risk for impaired skin integrity will decrease Outcome: Progressing   Problem: Pain Management: Goal: General experience of comfort will improve Outcome: Progressing

## 2023-09-12 NOTE — Transitions of Care (Post Inpatient/ED Visit) (Signed)
   09/12/2023  Name: Collin Yoder MRN: [id number] DOB: [birth year]  Signing this encounter as patient is currently admitted   Legrand Lasser, CMA  [facility] AWV Team Direct Dial: [phone]

## 2023-09-12 NOTE — TOC Progression Note (Signed)
Transition of Care [facility]) - Progression Note    Patient Details  Name: MELQUISEDEC KROMAH MRN: [id number] Date of Birth: November 29, 1950  Transition of Care [facility]) CM/SW Contact  Isabella Bowens, [place] Phone Number: 09/12/2023, 2:57 PM  Clinical Narrative:     CSW spoke with patient about SNF recommendation from PT and patient declined. Pt then stated that if he was to change his mind that he will let CSW know. TOC will continue to follow.   Expected Discharge Plan and Services    DC back home to hotel, if he does not agree to go to SNF for further short-term rehab services.    Social Determinants of Health (SDOH) Interventions SDOH Screenings   Food Insecurity: No Food Insecurity (03/15/2022)  Housing: Low Risk  (03/15/2022)  Transportation Needs: No Transportation Needs (03/15/2022)  Alcohol Screen: Low Risk  (03/15/2022)  Depression (PHQ2-9): Low Risk  (08/29/2023)  Financial Resource Strain: Low Risk  (03/15/2022)  Physical Activity: Sufficiently Active (03/15/2022)  Social Connections: Socially Isolated (03/15/2022)  Stress: No Stress Concern Present (03/15/2022)  Tobacco Use: High Risk (09/11/2023)    Readmission Risk Interventions     No data to display

## 2023-09-12 NOTE — Progress Notes (Signed)
Echocardiogram 2D Echocardiogram has been performed.  Warren Lacy Mong Neal RDCS 09/12/2023, 1:45 PM

## 2023-09-12 NOTE — ED Notes (Signed)
RN went into pt's room for antibiotics and other orders. Pt is not aware of year, date, age. A & O to self only. In report this RN informed pt was in his right state of mind but previous RN was informed pt was not acting his usual. EDP & hospitalist notified.

## 2023-09-12 NOTE — Evaluation (Signed)
Physical Therapy Evaluation Patient Details Name: Collin Yoder MRN: [id number] DOB: 20-Jul-1951 Today's Date: 09/12/2023  History of Present Illness  a 72 y.o. male with medical history significant for type 2 diabetes, diabetic neuropathy, prior CVA, hypertension, COPD, tobacco abuse, CKD 3B, who presented from [facility] via EMS to the ED due to altered mental status.  Reportedly the patient had complained of shortness of breath previously.     In the ED, alert and confused, oriented to self only.  Tachycardic with elevated temperature 99.2.  Lab work notable for elevated BNP greater than 760, elevated troponin 50, then 42, elevated D-dimer 0.98, elevated creatinine 2.43, close to his baseline.  Noncontrast head CT was nonacute.  No active disease was seen on chest x-ray.  Due to concern for possible acute pulmonary embolism VQ scan was ordered by EDP and is pending.  Since pneumonia could not be ruled out at this time, the patient was started on empiric IV antibiotics for CAP.  Admitted by [facility], hospitalist service  Clinical Impression   Pt tolerated today's Physical Therapy Evaluation, moderately well. Reduced attention and poor reliability when answering pertinent questions regarding PLOF. Pt A&O x 1. Unable to gather accurate picture pt's PLOF as pt is poor historian and AMS. Pt with unsteady gait pattern and balance deficits noted during transfers and ambulation. No AD utilized this session. Tolerant to 50ft of ambulation to recliner.  Pt is unsafe to remain alone during ambulation to balance deficits, muscle weakness and increases risk of harm during functional mobility and ADLs. Based upon these deficits/impairments, patient will benefit from continued skilled physical therapy services during remainder of hospital stay and at the next recommended venue of care to address deficits and promote return to optimal function.                If plan is discharge home, recommend the following:  A little help with walking and/or transfers;A little help with bathing/dressing/bathroom   Can travel by private vehicle   No    Equipment Recommendations    Recommendations for Other Services       Functional Status Assessment Patient has had a recent decline in their functional status and demonstrates the ability to make significant improvements in function in a reasonable and predictable amount of time.     Precautions / Restrictions Precautions Precautions: Fall Restrictions Weight Bearing Restrictions: No      Mobility  Bed Mobility               General bed mobility comments: Received sitting in additional chair in room with RN and pharmacy tech present. Patient Response: Cooperative  Transfers Overall transfer level: Needs assistance Equipment used: None Transfers: Sit to/from Stand Sit to Stand: Contact guard assist           General transfer comment: Unsteady prior to stand from additional chair with BUE support on handrail.  Controlled descent to recliner with BUE support providing eccentric control.    Ambulation/Gait Ambulation/Gait assistance: Min assist Gait Distance (Feet): 5 Feet Assistive device: None Gait Pattern/deviations: Step-through pattern, Decreased step length - right, Decreased step length - left, Shuffle, Trunk flexed       General Gait Details: Staggering gait pattern with min assist to maintain balance, no AD from additional chair in room to recliner.  Stairs            Wheelchair Mobility     Tilt Bed Tilt Bed Patient Response: Cooperative  Modified Rankin (Stroke Patients  Only)       Balance Overall balance assessment: Needs assistance Sitting-balance support: No upper extremity supported Sitting balance-Leahy Scale: Good Sitting balance - Comments: sitting in recliner   Standing balance support: No upper extremity supported Standing balance-Leahy Scale: Poor Standing balance comment: poor for standing  without AD.                             Pertinent Vitals/Pain Pain Assessment Pain Assessment: No/denies pain    Home Living                     Additional Comments: Per chart review,, patient lives at [facility].  Upon questioning, patient only oriented to self, when asked where you live, patient stated his home address.  Per patient report, ambulatory at home with rolling walker.  Independent for ADLs.  Poor historian, unable to gather true home living or prior level of functioning.    Prior Function               Mobility Comments: Per patient report, ambulatory with rolling walker at home. ADLs Comments: Patient reports independence with ADLs.     Extremity/Trunk Assessment   Upper Extremity Assessment Upper Extremity Assessment: Defer to OT evaluation    Lower Extremity Assessment Lower Extremity Assessment: Overall WFL for tasks assessed       Communication   Communication Communication: Difficulty following commands/understanding Following commands: Follows one step commands inconsistently;Follows multi-step commands inconsistently Cueing Techniques: Verbal cues;Tactile cues  Cognition Arousal: Alert Behavior During Therapy: Restless Overall Cognitive Status: No family/caregiver present to determine baseline cognitive functioning                                          General Comments      Exercises     Assessment/Plan    PT Assessment Patient needs continued PT services  PT Problem List Decreased strength;Decreased activity tolerance;Decreased balance;Decreased mobility       PT Treatment Interventions DME instruction;Gait training;Functional mobility training;Therapeutic activities;Therapeutic exercise;Balance training;Neuromuscular re-education    PT Goals (Current goals can be found in the Care Plan section)  Acute Rehab PT Goals Patient Stated Goal: None stated currently. Time For Goal Achievement:  09/26/23 Potential to Achieve Goals: Good    Frequency Min 3X/week     Co-evaluation               AM-PAC PT "6 Clicks" Mobility  Outcome Measure Help needed turning from your back to your side while in a flat bed without using bedrails?: None Help needed moving from lying on your back to sitting on the side of a flat bed without using bedrails?: None Help needed moving to and from a bed to a chair (including a wheelchair)?: A Little Help needed standing up from a chair using your arms (e.g., wheelchair or bedside chair)?: A Little Help needed to walk in hospital room?: A Little Help needed climbing 3-5 steps with a railing? : A Lot 6 Click Score: 19    End of Session Equipment Utilized During Treatment: Gait belt Activity Tolerance: Patient tolerated treatment well Patient left: in chair;with call bell/phone within reach;with chair alarm set Nurse Communication: Mobility status PT Visit Diagnosis: Unsteadiness on feet (R26.81);Muscle weakness (generalized) (M62.81)    Time: 1420-1430 PT Time Calculation (min) (ACUTE ONLY): 10 min  Charges:   PT Evaluation $PT Eval Low Complexity: 1 Low   PT General Charges $$ ACUTE PT VISIT: 1 Visit         Elie Goody, DPT [facility] [place] [phone] office  Nelida Meuse 09/12/2023, 2:53 PM

## 2023-09-12 NOTE — Progress Notes (Signed)
   09/12/23 1203  TOC Brief Assessment  Insurance and Status Reviewed  Patient has primary care physician Yes  Home environment has been reviewed Hotel  Prior level of function: Independent  Prior/Current Home Services No current home services  Social Determinants of Health Reivew SDOH reviewed no interventions necessary  Readmission risk has been reviewed Yes  Transition of care needs no transition of care needs at this time   Transition of Care Department [facility]) has reviewed patient and no TOC needs have been identified at this time. We will continue to monitor patient advancement through interdisciplinary progression rounds. If new patient transition needs arise, please place a TOC consult.

## 2023-09-12 NOTE — Inpatient Diabetes Management (Signed)
Inpatient Diabetes Program Recommendations  AACE/ADA: New Consensus Statement on Inpatient Glycemic Control   Target Ranges:  Prepandial:   less than 140 mg/dL      Peak postprandial:   less than 180 mg/dL (1-2 hours)      Critically ill patients:  140 - 180 mg/dL    Latest Reference Range & Units 09/11/23 21:20 09/12/23 08:10  Glucose-Capillary 70 - 99 mg/dL 409 (H) 811 (H)   Review of Glycemic Control  Diabetes history: DM2 Outpatient Diabetes medications: Lantus 10 units at bedtime, Amaryl 4 mg BID, Ozempic 2 mg Qweek Current orders for Inpatient glycemic control: Novolog 0-9 units TID with meals, Novolog 0-5 units QHS  Inpatient Diabetes Program Recommendations:    Insulin: If CBGs are consistently over 180 mg/dl, please consider ordering Semglee 8 units Q24H.  Thanks, Orlando Penner, RN, MSN, CDCES Diabetes Coordinator Inpatient Diabetes Program [phone] (Team Pager from 8am to 5pm)

## 2023-09-12 NOTE — Progress Notes (Signed)
PROGRESS NOTE   Collin Yoder, is a 72 y.o. male, DOB - Oct 20, 1950, XWR:[id number]  Admit date - 09/11/2023   Admitting Physician Darlin Drop, DO  Outpatient Primary MD for the patient is Anabel Halon, MD  LOS - 1  Chief Complaint  Patient presents with   Shortness of Breath      Brief Narrative:   72 y.o. male with medical history significant for type 2 diabetes, diabetic neuropathy, prior CVA, hypertension, COPD, tobacco abuse, CKD 3B, admitted with acute metabolic encephalopathy on 09/11/2023    -Assessment and Plan: 1) acute metabolic encephalopathy -CT without acute findings -Chest x-ray UA are not suggestive of infection -TSH WNL -UDS negative  2) acute CHF exacerbation -BNP 763 Troponin flat at 50 >.42 -D-dimer noted VQ scan negative for PE -Lasix as ordered  3)DM2-A1c is 11.4 reflecting uncontrolled DM with hyperglycemia PTA Use Novolog/Humalog Sliding scale insulin with Accu-Cheks/Fingersticks as ordered  4) CKD stage -Iv -Renal function appears to be close to baseline  --renally adjust medications, avoid nephrotoxic agents / dehydration  / hypotension   5) generalized weakness and deconditioning--physical therapy eval appreciated recommends SNF rehab  6)HTN--continue amlodipine  Status is: Inpatient   Disposition: The patient is from: Home              Anticipated d/c is to: SNF              Anticipated d/c date is: 1 day              Patient currently is not medically stable to d/c. Barriers: Not Clinically Stable-   Code Status :  -  Code Status: Full Code   Family Communication:   (patient is alert, awake and coherent)   DVT Prophylaxis  :   - SCDs  enoxaparin (LOVENOX) injection 30 mg Start: 09/12/23 1000   Lab Results  Component Value Date   PLT 200 09/11/2023    Inpatient Medications  Scheduled Meds:  amLODipine  10 mg Oral Daily   atorvastatin  40 mg Oral Daily   enoxaparin (LOVENOX) injection  30 mg Subcutaneous Q24H    [START ON 09/13/2023] furosemide  40 mg Intravenous Daily   insulin aspart  0-5 Units Subcutaneous QHS   insulin aspart  0-9 Units Subcutaneous TID WC   Continuous Infusions:  azithromycin     cefTRIAXone (ROCEPHIN)  IV     PRN Meds:.acetaminophen, melatonin, polyethylene glycol, prochlorperazine   Anti-infectives (From admission, onward)    Start     Dose/Rate Route Frequency Ordered Stop   09/12/23 2330  azithromycin (ZITHROMAX) 500 mg in sodium chloride 0.9 % 250 mL IVPB        500 mg 250 mL/hr over 60 Minutes Intravenous Every 24 hours 09/12/23 1837 09/12/23 2359   09/12/23 2200  cefTRIAXone (ROCEPHIN) 2 g in sodium chloride 0.9 % 100 mL IVPB        2 g 200 mL/hr over 30 Minutes Intravenous Every 24 hours 09/12/23 0202 09/12/23 2359   09/11/23 2330  cefTRIAXone (ROCEPHIN) 2 g in sodium chloride 0.9 % 100 mL IVPB        2 g 200 mL/hr over 30 Minutes Intravenous  Once 09/11/23 2322 09/12/23 0044   09/11/23 2330  azithromycin (ZITHROMAX) 500 mg in sodium chloride 0.9 % 250 mL IVPB  Status:  Discontinued        500 mg 250 mL/hr over 60 Minutes Intravenous Every 24 hours 09/11/23 2322 09/12/23 1837  Subjective: Collin Yoder today has no fevers, no emesis,  No chest pain,   - Confusion and disorientation persist   Objective: Vitals:   09/11/23 2341 09/12/23 0305 09/12/23 1258 09/12/23 1449  BP:  (!) 158/100 132/79   Pulse: (!) 108  (!) 108   Resp: 18 20    Temp: 99.2 F (37.3 C) 98.1 F (36.7 C) 98.6 F (37 C)   TempSrc: Oral  Oral   SpO2: 92% 99% 94% 94%  Weight:      Height:        Intake/Output Summary (Last 24 hours) at 09/12/2023 1837 Last data filed at 09/12/2023 1500 Gross per 24 hour  Intake 374.81 ml  Output --  Net 374.81 ml   Filed Weights   09/11/23 1825  Weight: 87.1 kg    Physical Exam  Gen:- Awake Alert,  in no apparent distress  HEENT:- [place].AT, No sclera icterus Neck-Supple Neck,No JVD,.  Lungs-  CTAB , fair symmetrical air  movement CV- S1, S2 normal, regular  Abd-  +ve B.Sounds, Abd Soft, No tenderness,    Extremity/Skin:- No  edema, pedal pulses present  Psych-pleasantly confused, cognitive and memory deficits noted  neuro-generalized weakness no new focal deficits, no tremors  Data Reviewed: I have personally reviewed following labs and imaging studies  CBC: Recent Labs  Lab 09/10/23 1525 09/11/23 1929  WBC 12.6* 15.3*  HGB 13.8 15.1  HCT 42.5 46.0  MCV 95.9 95.0  PLT 239 200   Basic Metabolic Panel: Recent Labs  Lab 09/10/23 1525 09/11/23 1929  NA 144 144  K 4.5 3.7  CL 99 97*  CO2 30 32  GLUCOSE 328* 310*  BUN 42* 46*  CREATININE 2.40* 2.43*  CALCIUM 10.4* 11.0*   GFR: Estimated Creatinine Clearance: 28.1 mL/min (A) (by C-G formula based on SCr of 2.43 mg/dL (H)). Liver Function Tests: Recent Labs  Lab 09/11/23 1929  AST 57*  ALT 26  ALKPHOS 69  BILITOT 1.0  PROT 8.2*  ALBUMIN 4.4   HbA1C: Recent Labs    09/11/23 2332  HGBA1C 11.4*    Recent Results (from the past 240 hour(s))  Resp panel by RT-PCR (RSV, Flu A&B, Covid) Anterior Nasal Swab     Status: None   Collection Time: 09/11/23  7:29 PM   Specimen: Anterior Nasal Swab  Result Value Ref Range Status   SARS Coronavirus 2 by RT PCR NEGATIVE NEGATIVE Final    Comment: (NOTE) SARS-CoV-2 target nucleic acids are NOT DETECTED.  The SARS-CoV-2 RNA is generally detectable in upper respiratory specimens during the acute phase of infection. The lowest concentration of SARS-CoV-2 viral copies this assay can detect is 138 copies/mL. A negative result does not preclude SARS-Cov-2 infection and should not be used as the sole basis for treatment or other patient management decisions. A negative result may occur with  improper specimen collection/handling, submission of specimen other than nasopharyngeal swab, presence of viral mutation(s) within the areas targeted by this assay, and inadequate number of  viral copies(<138 copies/mL). A negative result must be combined with clinical observations, patient history, and epidemiological information. The expected result is Negative.  Fact Sheet for Patients:  BloggerCourse.com  Fact Sheet for Healthcare Providers:  SeriousBroker.it  This test is no t yet approved or cleared by the [place] FDA and  has been authorized for detection and/or diagnosis of SARS-CoV-2 by FDA under an Emergency Use Authorization (EUA). This EUA will remain  in effect (meaning this test can be  used) for the duration of the COVID-19 declaration under Section 564(b)(1) of the Act, 21 U.S.C.section 360bbb-3(b)(1), unless the authorization is terminated  or revoked sooner.       Influenza A by PCR NEGATIVE NEGATIVE Final   Influenza B by PCR NEGATIVE NEGATIVE Final    Comment: (NOTE) The Xpert Xpress SARS-CoV-2/FLU/RSV plus assay is intended as an aid in the diagnosis of influenza from Nasopharyngeal swab specimens and should not be used as a sole basis for treatment. Nasal washings and aspirates are unacceptable for Xpert Xpress SARS-CoV-2/FLU/RSV testing.  Fact Sheet for Patients: BloggerCourse.com  Fact Sheet for Healthcare Providers: SeriousBroker.it  This test is not yet approved or cleared by the [place] FDA and has been authorized for detection and/or diagnosis of SARS-CoV-2 by FDA under an Emergency Use Authorization (EUA). This EUA will remain in effect (meaning this test can be used) for the duration of the COVID-19 declaration under Section 564(b)(1) of the Act, 21 U.S.C. section 360bbb-3(b)(1), unless the authorization is terminated or revoked.     Resp Syncytial Virus by PCR NEGATIVE NEGATIVE Final    Comment: (NOTE) Fact Sheet for Patients: BloggerCourse.com  Fact Sheet for Healthcare  Providers: SeriousBroker.it  This test is not yet approved or cleared by the [place] FDA and has been authorized for detection and/or diagnosis of SARS-CoV-2 by FDA under an Emergency Use Authorization (EUA). This EUA will remain in effect (meaning this test can be used) for the duration of the COVID-19 declaration under Section 564(b)(1) of the Act, 21 U.S.C. section 360bbb-3(b)(1), unless the authorization is terminated or revoked.  Performed at [facility], [address], [place], [place] [address]   Blood culture (routine x 2)     Status: None (Preliminary result)   Collection Time: 09/11/23 11:35 PM   Specimen: BLOOD  Result Value Ref Range Status   Specimen Description BLOOD LEFT ANTECUBITAL  Final   Special Requests   Final    BOTTLES DRAWN AEROBIC AND ANAEROBIC Blood Culture adequate volume   Culture   Final    NO GROWTH < 12 HOURS Performed at [facility], [address], [place], [place] [address]    Report Status PENDING  Incomplete  Blood culture (routine x 2)     Status: None (Preliminary result)   Collection Time: 09/11/23 11:35 PM   Specimen: BLOOD  Result Value Ref Range Status   Specimen Description BLOOD BLOOD LEFT HAND  Final   Special Requests   Final    BOTTLES DRAWN AEROBIC AND ANAEROBIC Blood Culture adequate volume   Culture   Final    NO GROWTH < 12 HOURS Performed at [facility], [address], [place], [place] [address]    Report Status PENDING  Incomplete      Radiology Studies: ECHOCARDIOGRAM COMPLETE  Result Date: 09/12/2023    ECHOCARDIOGRAM REPORT   Patient Name:   LAVONNE AMPARANO Date of Exam: 09/12/2023 Medical Rec #:  [id number]         Height:       67.0 in Accession #:    [id number]        Weight:       192.0 lb Date of Birth:  12-15-1950         BSA:          1.987 m Patient Age:    72 years          BP:           158/100  mmHg Patient Gender: M                 HR:           107 bpm. [facility] Procedure: 2D Echo, Color Doppler, Cardiac Doppler and Intracardiac            Opacification Agent Indications:    I50.31 Acute diastolic (congestive) heart failure  History:        Patient has no prior history of Echocardiogram examinations.                 COPD; Risk Factors:Hypertension, Diabetes and Dyslipidemia.  Sonographer:    Irving Burton Senior RDCS Referring Phys: [id number] Darlin Drop  Sonographer Comments: Technically difficult due to COPD IMPRESSIONS  1. Left ventricular ejection fraction, by estimation, is 55 to 60%. Left ventricular ejection fraction by PLAX is 57 %. The left ventricle has normal function. The left ventricle has no regional wall motion abnormalities. There is moderate concentric left ventricular hypertrophy. Indeterminate diastolic filling due to E-A fusion.  2. Right ventricular systolic function is normal. The right ventricular size is normal. Tricuspid regurgitation signal is inadequate for assessing PA pressure.  3. The mitral valve is grossly normal. Trivial mitral valve regurgitation. No evidence of mitral stenosis.  4. The aortic valve was not well visualized. Aortic valve regurgitation is not visualized. No aortic stenosis is present.  5. The inferior vena cava is normal in size but collapsibility could not be evaluated. Comparison(s): No prior Echocardiogram. FINDINGS  Left Ventricle: Left ventricular ejection fraction, by estimation, is 55 to 60%. Left ventricular ejection fraction by PLAX is 57 %. The left ventricle has normal function. The left ventricle has no regional wall motion abnormalities. Definity contrast agent was given IV to delineate the left ventricular endocardial borders. The left ventricular internal cavity size was normal in size. There is moderate concentric left ventricular hypertrophy. Indeterminate diastolic filling due to E-A fusion. Right Ventricle: The right ventricular size is normal. No increase in right ventricular wall thickness. Right  ventricular systolic function is normal. Tricuspid regurgitation signal is inadequate for assessing PA pressure. Left Atrium: Left atrial size was normal in size. Right Atrium: Right atrial size was normal in size. Pericardium: There is no evidence of pericardial effusion. Presence of epicardial fat layer. Mitral Valve: The mitral valve is grossly normal. Trivial mitral valve regurgitation. No evidence of mitral valve stenosis. Tricuspid Valve: The tricuspid valve is grossly normal. Tricuspid valve regurgitation is trivial. No evidence of tricuspid stenosis. Aortic Valve: The aortic valve was not well visualized. Aortic valve regurgitation is not visualized. No aortic stenosis is present. Pulmonic Valve: The pulmonic valve was not well visualized. Pulmonic valve regurgitation is not visualized. No evidence of pulmonic stenosis. Aorta: The aortic root and ascending aorta are structurally normal, with no evidence of dilitation. Venous: The inferior vena cava is normal in size but collapsibility could not be evaluated. IAS/Shunts: No atrial level shunt detected by color flow Doppler.  LEFT VENTRICLE PLAX 2D LV EF:         Left ventricular ejection fraction by PLAX is 57 %. LVIDd:         2.80 cm LVIDs:         2.00 cm LV PW:         1.40 cm LV IVS:        1.50 cm LVOT diam:     2.20 cm LV SV:  51 LV SV Index:   26 LVOT Area:     3.80 cm  RIGHT VENTRICLE RV S prime:     11.10 cm/s TAPSE (M-mode): 1.6 cm LEFT ATRIUM             Index        RIGHT ATRIUM           Index LA diam:        3.70 cm 1.86 cm/m   RA Area:     13.10 cm LA Vol (A2C):   39.5 ml 19.87 ml/m  RA Volume:   26.60 ml  13.38 ml/m LA Vol (A4C):   51.4 ml 25.86 ml/m LA Biplane Vol: 47.4 ml 23.85 ml/m  AORTIC VALVE LVOT Vmax:   91.00 cm/s LVOT Vmean:  65.300 cm/s LVOT VTI:    0.135 m  AORTA Ao Root diam: 3.40 cm Ao Asc diam:  3.20 cm MITRAL VALVE MV Area (PHT): 5.16 cm     SHUNTS MV Decel Time: 147 msec     Systemic VTI:  0.14 m MV E velocity:  63.90 cm/s   Systemic Diam: 2.20 cm MV A velocity: 108.00 cm/s MV E/A ratio:  0.59 Vishnu Priya Mallipeddi Electronically signed by Winfield Rast Mallipeddi Signature Date/Time: 09/12/2023/4:06:38 PM    Final    NM Pulmonary Perfusion  Result Date: 09/12/2023 CLINICAL DATA:  Pulmonary embolism suspected. Low to intermediate probability. Abnormal D-dimer. Chest pain and shortness of breath. EXAM: NUCLEAR MEDICINE PERFUSION LUNG SCAN TECHNIQUE: Perfusion images were obtained in multiple projections after intravenous injection of radiopharmaceutical. Ventilation scans intentionally deferred if perfusion scan and chest x-ray adequate for interpretation during COVID 19 epidemic. RADIOPHARMACEUTICALS:  4.4 mCi Tc-62m MAA IV COMPARISON:  Chest radiography yesterday. FINDINGS: Normal appearing perfusion study. No evidence of any segmental or subsegmental pulmonary embolism. IMPRESSION: Normal perfusion exam. Electronically Signed   By: Paulina Fusi M.D.   On: 09/12/2023 11:06   DG Chest Port 1 View  Result Date: 09/11/2023 CLINICAL DATA:  Shortness of breath EXAM: PORTABLE CHEST 1 VIEW COMPARISON:  11/22/2021 FINDINGS: Cardiac shadow is within normal limits. Aortic calcifications are noted. The lungs are well aerated bilaterally. No focal infiltrate or effusion is seen. No acute bony abnormality is noted. IMPRESSION: No active disease. Electronically Signed   By: Alcide Clever M.D.   On: 09/11/2023 19:25   CT Head Wo Contrast  Result Date: 09/11/2023 CLINICAL DATA:  Shortness of breath and confusion, initial encounter EXAM: CT HEAD WITHOUT CONTRAST TECHNIQUE: Contiguous axial images were obtained from the base of the skull through the vertex without intravenous contrast. RADIATION DOSE REDUCTION: This exam was performed according to the departmental dose-optimization program which includes automated exposure control, adjustment of the mA and/or kV according to patient size and/or use of iterative reconstruction  technique. COMPARISON:  01/27/2019 FINDINGS: Brain: No evidence of acute infarction, hemorrhage, hydrocephalus, extra-axial collection or mass lesion/mass effect. Chronic atrophic and ischemic changes are noted. Lacunar infarct is again seen in the left thalamus stable from the prior exam. Similar lacunar infarct in the left basal ganglia/subinsular cortex is noted as well. Vascular: No hyperdense vessel or unexpected calcification. Skull: Normal. Negative for fracture or focal lesion. Sinuses/Orbits: No acute finding. Other: None. IMPRESSION: Chronic atrophic and ischemic changes.  No acute abnormality noted. Electronically Signed   By: Alcide Clever M.D.   On: 09/11/2023 19:23    Scheduled Meds:  amLODipine  10 mg Oral Daily   atorvastatin  40 mg Oral Daily  enoxaparin (LOVENOX) injection  30 mg Subcutaneous Q24H   [START ON 09/13/2023] furosemide  40 mg Intravenous Daily   insulin aspart  0-5 Units Subcutaneous QHS   insulin aspart  0-9 Units Subcutaneous TID WC   Continuous Infusions:  azithromycin     cefTRIAXone (ROCEPHIN)  IV      LOS: 1 day   Shon Hale M.D on 09/12/2023 at 6:37 PM  Go to [email or web address] - for contact info  [facility] - Office  [phone]  If 7PM-7AM, please contact night-coverage [email or web address] 09/12/2023, 6:37 PM

## 2023-09-12 NOTE — Plan of Care (Signed)
  Problem: Acute Rehab PT Goals(only PT should resolve) Goal: Pt Will Go Supine/Side To Sit Flowsheets (Taken 09/12/2023 1459) Pt will go Supine/Side to Sit: Independently Goal: Patient Will Perform Sitting Balance Flowsheets (Taken 09/12/2023 1459) Patient will perform sitting balance: Independently Goal: Patient Will Transfer Sit To/From Stand Flowsheets (Taken 09/12/2023 1459) Patient will transfer sit to/from stand: Independently Goal: Pt Will Ambulate Flowsheets (Taken 09/12/2023 1459) Pt will Ambulate:  75 feet  Independently  with modified independence  with least restrictive assistive device  with rolling walker  Elie Goody, DPT [facility] [place] [address] [phone] office

## 2023-09-13 DIAGNOSIS — G9341 Metabolic encephalopathy: Secondary | ICD-10-CM | POA: Diagnosis not present

## 2023-09-13 DIAGNOSIS — N184 Chronic kidney disease, stage 4 (severe): Secondary | ICD-10-CM

## 2023-09-13 DIAGNOSIS — E870 Hyperosmolality and hypernatremia: Secondary | ICD-10-CM

## 2023-09-13 DIAGNOSIS — Z794 Long term (current) use of insulin: Secondary | ICD-10-CM

## 2023-09-13 DIAGNOSIS — I5033 Acute on chronic diastolic (congestive) heart failure: Secondary | ICD-10-CM | POA: Diagnosis present

## 2023-09-13 DIAGNOSIS — I5032 Chronic diastolic (congestive) heart failure: Secondary | ICD-10-CM | POA: Diagnosis present

## 2023-09-13 DIAGNOSIS — E1165 Type 2 diabetes mellitus with hyperglycemia: Secondary | ICD-10-CM

## 2023-09-13 LAB — CBC
HCT: 41.5 % (ref 39.0–52.0)
Hemoglobin: 13.7 g/dL (ref 13.0–17.0)
MCH: 31.9 pg (ref 26.0–34.0)
MCHC: 33 g/dL (ref 30.0–36.0)
MCV: 96.7 fL (ref 80.0–100.0)
Platelets: 182 10*3/uL (ref 150–400)
RBC: 4.29 MIL/uL (ref 4.22–5.81)
RDW: 11.9 % (ref 11.5–15.5)
WBC: 12.8 10*3/uL — ABNORMAL HIGH (ref 4.0–10.5)
nRBC: 0 % (ref 0.0–0.2)

## 2023-09-13 LAB — BASIC METABOLIC PANEL
Anion gap: 12 (ref 5–15)
BUN: 58 mg/dL — ABNORMAL HIGH (ref 8–23)
CO2: 32 mmol/L (ref 22–32)
Calcium: 9.5 mg/dL (ref 8.9–10.3)
Chloride: 102 mmol/L (ref 98–111)
Creatinine, Ser: 2.66 mg/dL — ABNORMAL HIGH (ref 0.61–1.24)
GFR, Estimated: 24 mL/min — ABNORMAL LOW (ref >60)
Glucose, Bld: 164 mg/dL — ABNORMAL HIGH (ref 70–99)
Potassium: 3.5 mmol/L (ref 3.5–5.1)
Sodium: 146 mmol/L — ABNORMAL HIGH (ref 135–145)

## 2023-09-13 LAB — GLUCOSE, CAPILLARY
Glucose-Capillary: 191 mg/dL — ABNORMAL HIGH (ref 70–99)
Glucose-Capillary: 206 mg/dL — ABNORMAL HIGH (ref 70–99)

## 2023-09-13 MED ORDER — SEMAGLUTIDE (2 MG/DOSE) 8 MG/3ML ~~LOC~~ SOPN: 2.0000 mg | PEN_INJECTOR | SUBCUTANEOUS | 3 refills | Status: DC

## 2023-09-13 MED ORDER — LANTUS SOLOSTAR 100 UNIT/ML ~~LOC~~ SOPN: 10.0000 [IU] | PEN_INJECTOR | Freq: Every day | SUBCUTANEOUS | 3 refills | Status: DC

## 2023-09-13 MED ORDER — AMLODIPINE BESYLATE 10 MG PO TABS: 10.0000 mg | ORAL_TABLET | Freq: Every day | ORAL | 3 refills | Status: AC

## 2023-09-13 MED ORDER — ASPIRIN 81 MG PO CHEW: 81.0000 mg | CHEWABLE_TABLET | Freq: Every day | ORAL | 11 refills | Status: AC

## 2023-09-13 MED ORDER — GLIMEPIRIDE 4 MG PO TABS: 4.0000 mg | ORAL_TABLET | Freq: Two times a day (BID) | ORAL | 3 refills | Status: DC

## 2023-09-13 MED ORDER — FUROSEMIDE 20 MG PO TABS: 20.0000 mg | ORAL_TABLET | Freq: Every day | ORAL | 1 refills | Status: DC | PRN

## 2023-09-13 MED ORDER — OLMESARTAN MEDOXOMIL 5 MG PO TABS: 5.0000 mg | ORAL_TABLET | Freq: Every day | ORAL | 11 refills | Status: AC

## 2023-09-13 MED ORDER — ATORVASTATIN CALCIUM 40 MG PO TABS: 40.0000 mg | ORAL_TABLET | Freq: Every day | ORAL | 3 refills | Status: AC

## 2023-09-13 NOTE — Inpatient Diabetes Management (Addendum)
Inpatient Diabetes Program Recommendations  AACE/ADA: New Consensus Statement on Inpatient Glycemic Control   Target Ranges:  Prepandial:   less than 140 mg/dL      Peak postprandial:   less than 180 mg/dL (1-2 hours)      Critically ill patients:  140 - 180 mg/dL    Latest Reference Range & Units 09/12/23 08:10 09/12/23 11:46 09/12/23 16:45 09/12/23 20:45  Glucose-Capillary 70 - 99 mg/dL 161 (H) 096 (H) 045 (H) 169 (H)    Latest Reference Range & Units 05/22/23 14:30 09/11/23 23:32  Hemoglobin A1C 4.8 - 5.6 % 10.2 (H) 11.4 (H)   Review of Glycemic Control  Diabetes history: DM2 Outpatient Diabetes medications: Lantus 10 units at bedtime, Amaryl 4 mg BID, Ozempic 2 mg Qweek Current orders for Inpatient glycemic control: Novolog 0-9 units TID with meals, Novolog 0-5 units QHS   Inpatient Diabetes Program Recommendations:    HbgA1C:  A1C 11.4% on 09/11/23 indicating an average glucose of 280 mg/dl over the past 2-3 months.  NOTE: Patient admitted with AME, elevated BNP, elevated troponin, and elevated d-dimer with noted hyperglycemia. Per notes, patient presented from [facility] where he resides due to altered mental status. In reviewing chart, noted patient seen PCP on 05/22/23 and A1C was 10.2% at that time. Per office visit notes patient was taking Amaryl 2 mg BID and Ozempic 0.25 mg Qweek for DM control; patient not following carb modified diet. Per office note, patient was asked to increase Amaryl to 4 mg BID and increase Ozempic to 0.5 mg Qweek x4 then increase to 1 mg Qweek.   09/13/23@13 :30-Went to speak with patient regarding A1C but already discharged.  Thanks, Orlando Penner, RN, MSN, CDCES Diabetes Coordinator Inpatient Diabetes Program [phone] (Team Pager from 8am to 5pm)

## 2023-09-13 NOTE — Plan of Care (Signed)
  Problem: Fluid Volume: Goal: Ability to maintain a balanced intake and output will improve Outcome: Progressing   Problem: Metabolic: Goal: Ability to maintain appropriate glucose levels will improve Outcome: Progressing   

## 2023-09-13 NOTE — Care Management Important Message (Signed)
Important Message  Patient Details  Name: Collin Yoder MRN: [id number] Date of Birth: Sep 03, 1951   Important Message Given:  N/A - LOS <3 / Initial given by admissions     Corey Harold 09/13/2023, 10:56 AM

## 2023-09-13 NOTE — TOC Transition Note (Signed)
Transition of Care [facility]) - Discharge Note   Patient Details  Name: Collin Yoder MRN: [id number] Date of Birth: 04-Sep-1951  [facility] CM/SW Contact:  Beather Arbour Phone Number: 09/13/2023, 11:34 AM   Clinical Narrative:    Patient is discharging today. CSW went to speak with patient at bedside to follow up about SNF/ HH at home recommendations. Patient again declined SNF and when asking about if he was interested in [facility] at the motel, patient stated , " Ma'am I have friends/neighbors who help me with what I need and at this present time no". TOC signing off.    Final next level of care: Home/Self Care Barriers to Discharge: Barriers Resolved   Patient Goals and CMS Choice Patient states their goals for this hospitalization and ongoing recovery are:: DC back to [email or web address].gov Compare Post Acute Care list provided to:: Patient Choice offered to / list presented to : Patient      Discharge Placement   Back to [facility]     Discharge Plan and Services Additional resources added to the After Visit Summary for     Social Drivers of Health (SDOH) Interventions SDOH Screenings   Food Insecurity: No Food Insecurity (03/15/2022)  Housing: Low Risk  (03/15/2022)  Transportation Needs: No Transportation Needs (03/15/2022)  Alcohol Screen: Low Risk  (03/15/2022)  Depression (PHQ2-9): Low Risk  (08/29/2023)  Financial Resource Strain: Low Risk  (03/15/2022)  Physical Activity: Sufficiently Active (03/15/2022)  Social Connections: Socially Isolated (03/15/2022)  Stress: No Stress Concern Present (03/15/2022)  Tobacco Use: High Risk (09/11/2023)     Readmission Risk Interventions    09/13/2023   11:34 AM  Readmission Risk Prevention Plan  Medication Screening Complete  Transportation Screening Complete

## 2023-09-13 NOTE — Discharge Instructions (Addendum)
1)Avoid ibuprofen/Advil/Aleve/Motrin/Goody Powders/Naproxen/BC powders/Meloxicam/Diclofenac/Indomethacin and other Nonsteroidal anti-inflammatory medications as these will make you more likely to bleed and can cause stomach ulcers, can also cause Kidney problems.   2)Follow up with Anabel Halon, MD (Primary care Physician) in 1 week for Repeat CBC and BMP Blood Tests  3)You have refused to go to skilled nursing facility for physical therapy/rehab----Home health physical therapy will be arranged for you

## 2023-09-13 NOTE — Discharge Summary (Addendum)
Collin Yoder, is a 72 y.o. male  DOB [birth year]  MRN [id number].  Admission date:  09/11/2023  Admitting Physician  Darlin Drop, DO  Discharge Date:  09/13/2023   Primary MD  Anabel Halon, MD  Recommendations for primary care physician for things to follow:  1)Avoid ibuprofen/Advil/Aleve/Motrin/Goody Powders/Naproxen/BC powders/Meloxicam/Diclofenac/Indomethacin and other Nonsteroidal anti-inflammatory medications as these will make you more likely to bleed and can cause stomach ulcers, can also cause Kidney problems.   2)Follow up with Anabel Halon, MD (Primary care Physician) in 1 week for Repeat CBC and BMP Blood Tests  3)You have refused to go to skilled nursing facility for physical therapy/rehab----Home health physical therapy will be arranged for you  Admission Diagnosis  AMS (altered mental status) [R41.82]  Discharge Diagnosis  AMS (altered mental status) [R41.82]    Principal Problem:   AMS (altered mental status) Active Problems:   Diabetes mellitus (HCC)   Stage 4 chronic kidney disease (HCC)   Acute metabolic encephalopathy   Acute on chronic diastolic CHF (congestive heart failure) (HCC)   Hypertension      Past Medical History:  Diagnosis Date   Borderline diabetes    Peripheral neuropathy 07/15/2019   Right lower lobe lung mass 04/11/2017   Thalamic stroke (HCC) 07/15/2019   Type II diabetes mellitus, uncontrolled 07/15/2019   Vitamin D deficiency disease 07/15/2019    Past Surgical History:  Procedure Laterality Date   APPENDECTOMY     CATARACT EXTRACTION, BILATERAL Bilateral 01/2021    HPI  from the history and physical done on the day of admission:   Chief Complaint: Altered mental status.   HPI: Collin Yoder is a 72 y.o. male with medical history significant for type 2 diabetes, diabetic neuropathy, prior CVA, hypertension, COPD, tobacco abuse, CKD 3B,  who presented from [facility] via EMS to the ED due to altered mental status.  Reportedly the patient had complained of shortness of breath previously.   In the ED, alert and confused, oriented to self only.  Tachycardic with elevated temperature 99.2.  Lab work notable for elevated BNP greater than 760, elevated troponin 50, then 42, elevated D-dimer 0.98, elevated creatinine 2.43, close to his baseline.  Noncontrast head CT was nonacute.  No active disease was seen on chest x-ray.  Due to concern for possible acute pulmonary embolism VQ scan was ordered by EDP and is pending.  Since pneumonia could not be ruled out at this time, the patient was started on empiric IV antibiotics for CAP.  Admitted by [facility], hospitalist service.   ED Course: Tmax 99.2.  Blood pressure 111/73, heart rate 116, respiratory 16, O2 saturation 91% on room air.   Review of Systems: Review of systems as noted in the HPI. All other systems reviewed and are negative.   Hospital Course:     Brief Narrative:   72 y.o. male with medical history significant for type 2 diabetes, diabetic neuropathy, prior CVA, hypertension, COPD, tobacco abuse, CKD 3B, admitted with acute metabolic encephalopathy on  09/11/2023     -Assessment and Plan: 1) acute metabolic encephalopathy -CT without acute findings -Chest x-ray UA are not suggestive of infection -TSH WNL -UDS negative -Mentation back to normal -Patient is alert, oriented, coherent and appropriate   2) acute on chronic diastolic CHF exacerbation -BNP 763 Troponin flat at 50 >.42 -D-dimer noted VQ scan negative for PE -Echo with EF of 55 to 60% -Treated with IV Lasix... Diuresed well, creatinine and sodium trending up so no further IV Lasix -Okay to use oral Lasix 20 mg as needed postdischarge -Repeat BMP within a week advised giving uptrending sodium and creatinine in the setting of IV Lasix use   3)DM2-A1c is 11.4 reflecting uncontrolled DM with hyperglycemia  PTA --Diabetic educator note noted -Given elevated A1c, okay to adjust Amaryl and Ozempic as in discharge medication reconciliation list -Follow-up with PCP for recheck   4) CKD stage -IV -Please see #2 regarding concerns about uptrending sodium and creatinine in setting IV Lasix use --renally adjust medications, avoid nephrotoxic agents / dehydration  / hypotension   5) generalized weakness and deconditioning--physical therapy eval appreciated recommends SNF rehab -Patient refuses to go to SNF rehab -Home with services with physical therapy arranged for patient   6)HTN--continue amlodipine and olmesartan  7)Social--wearing monitor on his leg (patient is registered sex offender) -Patient lives in a motel  Discharge Condition: Stable  Follow UP   Follow-up Information     Anabel Halon, MD. Schedule an appointment as soon as possible for a visit in 1 week(s).   Specialty: Internal Medicine Why: Repeat CBC and BMP in 1 week Contact information: [address] [place] [address] [phone]                 Diet and Activity recommendation:  As advised  Discharge Instructions    Discharge Instructions     Ambulatory Referral for Lung Cancer Scre   Complete by: As directed    Call MD for:  difficulty breathing, headache or visual disturbances   Complete by: As directed    Call MD for:  persistant dizziness or light-headedness   Complete by: As directed    Call MD for:  persistant nausea and vomiting   Complete by: As directed    Call MD for:  temperature >100.4   Complete by: As directed    Diet - low sodium heart healthy   Complete by: As directed    Discharge instructions   Complete by: As directed    1)Avoid ibuprofen/Advil/Aleve/Motrin/Goody Powders/Naproxen/BC powders/Meloxicam/Diclofenac/Indomethacin and other Nonsteroidal anti-inflammatory medications as these will make you more likely to bleed and can cause stomach ulcers, can also cause Kidney  problems.   2)Follow up with Anabel Halon, MD (Primary care Physician) in 1 week for Repeat CBC and BMP Blood Tests  3) you have refused to go to skilled nursing facility for physical therapy/rehab----Home health physical therapy will be arranged for you   Increase activity slowly   Complete by: As directed         Discharge Medications     Allergies as of 09/13/2023   No Known Allergies      Medication List     TAKE these medications    amLODipine 10 MG tablet Commonly known as: NORVASC Take 1 tablet (10 mg total) by mouth daily.   aspirin 81 MG chewable tablet Chew 1 tablet (81 mg total) by mouth daily. What changed:  how much to take when to take this  atorvastatin 40 MG tablet Commonly known as: LIPITOR Take 1 tablet (40 mg total) by mouth daily.   B-D SINGLE USE SWABS REGULAR Pads 1 Units by Does not apply route daily at 12 noon.   furosemide 20 MG tablet Commonly known as: Lasix Take 1 tablet (20 mg total) by mouth daily as needed for fluid or edema (shortness  of breath). Start taking on: September 15, 2023   gabapentin 300 MG capsule Commonly known as: NEURONTIN Take 1 capsule (300 mg total) by mouth 3 (three) times daily.   glimepiride 4 MG tablet Commonly known as: AMARYL Take 1 tablet (4 mg total) by mouth 2 (two) times daily before a meal.   Lantus SoloStar 100 UNIT/ML Solostar Pen Generic drug: insulin glargine Inject 10 Units into the skin daily.   olmesartan 5 MG tablet Commonly known as: BENICAR Take 1 tablet (5 mg total) by mouth daily.   Pen Needles 32G X 4 MM Misc Use as directed for injecting insulin   Semaglutide (2 MG/DOSE) 8 MG/3ML Sopn Inject 2 mg as directed every Friday. Dose change - 08/29/23-----Once Weekly Start taking on: September 14, 2023 What changed:  when to take this additional instructions   True Metrix Blood Glucose Test test strip Generic drug: glucose blood Use as instructed   True Metrix Meter  w/Device Kit 1 kit by Does not apply route See admin instructions.   TRUEplus Lancets 33G Misc USE TO TEST DAILY AT 12 NOON.   Vitamin D3 125 MCG (5000 UT) Caps Take 1 capsule by mouth daily.       Major procedures and Radiology Reports - PLEASE review detailed and final reports for all details, in brief -   ECHOCARDIOGRAM COMPLETE Result Date: 09/12/2023    ECHOCARDIOGRAM REPORT   Patient Name:   ARACELI ORVIS Date of Exam: 09/12/2023 Medical Rec #:  [id number]         Height:       67.0 in Accession #:    [id number]        Weight:       192.0 lb Date of Birth:  [birth year]         BSA:          1.987 m Patient Age:    72 years          BP:           158/100 mmHg Patient Gender: M                 HR:           107 bpm. [facility] Procedure: 2D Echo, Color Doppler, Cardiac Doppler and Intracardiac            Opacification Agent Indications:    I50.31 Acute diastolic (congestive) heart failure  History:        Patient has no prior history of Echocardiogram examinations.                 COPD; Risk Factors:Hypertension, Diabetes and Dyslipidemia.  Sonographer:    Irving Burton Senior RDCS Referring Phys: [id number] Darlin Drop  Sonographer Comments: Technically difficult due to COPD IMPRESSIONS  1. Left ventricular ejection fraction, by estimation, is 55 to 60%. Left ventricular ejection fraction by PLAX is 57 %. The left ventricle has normal function. The left ventricle has no regional wall motion abnormalities. There is moderate concentric left ventricular hypertrophy. Indeterminate diastolic filling due to E-A fusion.  2. Right ventricular  systolic function is normal. The right ventricular size is normal. Tricuspid regurgitation signal is inadequate for assessing PA pressure.  3. The mitral valve is grossly normal. Trivial mitral valve regurgitation. No evidence of mitral stenosis.  4. The aortic valve was not well visualized. Aortic valve regurgitation is not visualized. No aortic stenosis  is present.  5. The inferior vena cava is normal in size but collapsibility could not be evaluated. Comparison(s): No prior Echocardiogram. FINDINGS  Left Ventricle: Left ventricular ejection fraction, by estimation, is 55 to 60%. Left ventricular ejection fraction by PLAX is 57 %. The left ventricle has normal function. The left ventricle has no regional wall motion abnormalities. Definity contrast agent was given IV to delineate the left ventricular endocardial borders. The left ventricular internal cavity size was normal in size. There is moderate concentric left ventricular hypertrophy. Indeterminate diastolic filling due to E-A fusion. Right Ventricle: The right ventricular size is normal. No increase in right ventricular wall thickness. Right ventricular systolic function is normal. Tricuspid regurgitation signal is inadequate for assessing PA pressure. Left Atrium: Left atrial size was normal in size. Right Atrium: Right atrial size was normal in size. Pericardium: There is no evidence of pericardial effusion. Presence of epicardial fat layer. Mitral Valve: The mitral valve is grossly normal. Trivial mitral valve regurgitation. No evidence of mitral valve stenosis. Tricuspid Valve: The tricuspid valve is grossly normal. Tricuspid valve regurgitation is trivial. No evidence of tricuspid stenosis. Aortic Valve: The aortic valve was not well visualized. Aortic valve regurgitation is not visualized. No aortic stenosis is present. Pulmonic Valve: The pulmonic valve was not well visualized. Pulmonic valve regurgitation is not visualized. No evidence of pulmonic stenosis. Aorta: The aortic root and ascending aorta are structurally normal, with no evidence of dilitation. Venous: The inferior vena cava is normal in size but collapsibility could not be evaluated. IAS/Shunts: No atrial level shunt detected by color flow Doppler.  LEFT VENTRICLE PLAX 2D LV EF:         Left ventricular ejection fraction by PLAX is 57 %.  LVIDd:         2.80 cm LVIDs:         2.00 cm LV PW:         1.40 cm LV IVS:        1.50 cm LVOT diam:     2.20 cm LV SV:         51 LV SV Index:   26 LVOT Area:     3.80 cm  RIGHT VENTRICLE RV S prime:     11.10 cm/s TAPSE (M-mode): 1.6 cm LEFT ATRIUM             Index        RIGHT ATRIUM           Index LA diam:        3.70 cm 1.86 cm/m   RA Area:     13.10 cm LA Vol (A2C):   39.5 ml 19.87 ml/m  RA Volume:   26.60 ml  13.38 ml/m LA Vol (A4C):   51.4 ml 25.86 ml/m LA Biplane Vol: 47.4 ml 23.85 ml/m  AORTIC VALVE LVOT Vmax:   91.00 cm/s LVOT Vmean:  65.300 cm/s LVOT VTI:    0.135 m  AORTA Ao Root diam: 3.40 cm Ao Asc diam:  3.20 cm MITRAL VALVE MV Area (PHT): 5.16 cm     SHUNTS MV Decel Time: 147 msec     Systemic VTI:  0.14  m MV E velocity: 63.90 cm/s   Systemic Diam: 2.20 cm MV A velocity: 108.00 cm/s MV E/A ratio:  0.59 Vishnu Priya Mallipeddi Electronically signed by Winfield Rast Mallipeddi Signature Date/Time: 09/12/2023/4:06:38 PM    Final    NM Pulmonary Perfusion Result Date: 09/12/2023 CLINICAL DATA:  Pulmonary embolism suspected. Low to intermediate probability. Abnormal D-dimer. Chest pain and shortness of breath. EXAM: NUCLEAR MEDICINE PERFUSION LUNG SCAN TECHNIQUE: Perfusion images were obtained in multiple projections after intravenous injection of radiopharmaceutical. Ventilation scans intentionally deferred if perfusion scan and chest x-ray adequate for interpretation during COVID 19 epidemic. RADIOPHARMACEUTICALS:  4.4 mCi Tc-71m MAA IV COMPARISON:  Chest radiography yesterday. FINDINGS: Normal appearing perfusion study. No evidence of any segmental or subsegmental pulmonary embolism. IMPRESSION: Normal perfusion exam. Electronically Signed   By: Paulina Fusi M.D.   On: 09/12/2023 11:06   DG Chest Port 1 View Result Date: 09/11/2023 CLINICAL DATA:  Shortness of breath EXAM: PORTABLE CHEST 1 VIEW COMPARISON:  11/22/2021 FINDINGS: Cardiac shadow is within normal limits. Aortic  calcifications are noted. The lungs are well aerated bilaterally. No focal infiltrate or effusion is seen. No acute bony abnormality is noted. IMPRESSION: No active disease. Electronically Signed   By: Alcide Clever M.D.   On: 09/11/2023 19:25   CT Head Wo Contrast Result Date: 09/11/2023 CLINICAL DATA:  Shortness of breath and confusion, initial encounter EXAM: CT HEAD WITHOUT CONTRAST TECHNIQUE: Contiguous axial images were obtained from the base of the skull through the vertex without intravenous contrast. RADIATION DOSE REDUCTION: This exam was performed according to the departmental dose-optimization program which includes automated exposure control, adjustment of the mA and/or kV according to patient size and/or use of iterative reconstruction technique. COMPARISON:  01/27/2019 FINDINGS: Brain: No evidence of acute infarction, hemorrhage, hydrocephalus, extra-axial collection or mass lesion/mass effect. Chronic atrophic and ischemic changes are noted. Lacunar infarct is again seen in the left thalamus stable from the prior exam. Similar lacunar infarct in the left basal ganglia/subinsular cortex is noted as well. Vascular: No hyperdense vessel or unexpected calcification. Skull: Normal. Negative for fracture or focal lesion. Sinuses/Orbits: No acute finding. Other: None. IMPRESSION: Chronic atrophic and ischemic changes.  No acute abnormality noted. Electronically Signed   By: Alcide Clever M.D.   On: 09/11/2023 19:23   Micro Results   Recent Results (from the past 240 hours)  Resp panel by RT-PCR (RSV, Flu A&B, Covid) Anterior Nasal Swab     Status: None   Collection Time: 09/11/23  7:29 PM   Specimen: Anterior Nasal Swab  Result Value Ref Range Status   SARS Coronavirus 2 by RT PCR NEGATIVE NEGATIVE Final    Comment: (NOTE) SARS-CoV-2 target nucleic acids are NOT DETECTED.  The SARS-CoV-2 RNA is generally detectable in upper respiratory specimens during the acute phase of infection. The  lowest concentration of SARS-CoV-2 viral copies this assay can detect is 138 copies/mL. A negative result does not preclude SARS-Cov-2 infection and should not be used as the sole basis for treatment or other patient management decisions. A negative result may occur with  improper specimen collection/handling, submission of specimen other than nasopharyngeal swab, presence of viral mutation(s) within the areas targeted by this assay, and inadequate number of viral copies(<138 copies/mL). A negative result must be combined with clinical observations, patient history, and epidemiological information. The expected result is Negative.  Fact Sheet for Patients:  BloggerCourse.com  Fact Sheet for Healthcare Providers:  SeriousBroker.it  This test is no t yet approved  or cleared by the [place] and  has been authorized for detection and/or diagnosis of SARS-CoV-2 by FDA under an Emergency Use Authorization (EUA). This EUA will remain  in effect (meaning this test can be used) for the duration of the COVID-19 declaration under Section 564(b)(1) of the Act, 21 U.S.C.section 360bbb-3(b)(1), unless the authorization is terminated  or revoked sooner.       Influenza A by PCR NEGATIVE NEGATIVE Final   Influenza B by PCR NEGATIVE NEGATIVE Final    Comment: (NOTE) The Xpert Xpress SARS-CoV-2/FLU/RSV plus assay is intended as an aid in the diagnosis of influenza from Nasopharyngeal swab specimens and should not be used as a sole basis for treatment. Nasal washings and aspirates are unacceptable for Xpert Xpress SARS-CoV-2/FLU/RSV testing.  Fact Sheet for Patients: BloggerCourse.com  Fact Sheet for Healthcare Providers: SeriousBroker.it  This test is not yet approved or cleared by the [place] FDA and has been authorized for detection and/or diagnosis of SARS-CoV-2 by FDA under  an Emergency Use Authorization (EUA). This EUA will remain in effect (meaning this test can be used) for the duration of the COVID-19 declaration under Section 564(b)(1) of the Act, 21 U.S.C. section 360bbb-3(b)(1), unless the authorization is terminated or revoked.     Resp Syncytial Virus by PCR NEGATIVE NEGATIVE Final    Comment: (NOTE) Fact Sheet for Patients: BloggerCourse.com  Fact Sheet for Healthcare Providers: SeriousBroker.it  This test is not yet approved or cleared by the [place] FDA and has been authorized for detection and/or diagnosis of SARS-CoV-2 by FDA under an Emergency Use Authorization (EUA). This EUA will remain in effect (meaning this test can be used) for the duration of the COVID-19 declaration under Section 564(b)(1) of the Act, 21 U.S.C. section 360bbb-3(b)(1), unless the authorization is terminated or revoked.  Performed at [facility], [address] [place], [place] [address]   Blood culture (routine x 2)     Status: None (Preliminary result)   Collection Time: 09/11/23 11:35 PM   Specimen: BLOOD  Result Value Ref Range Status   Specimen Description BLOOD LEFT ANTECUBITAL  Final   Special Requests   Final    BOTTLES DRAWN AEROBIC AND ANAEROBIC Blood Culture adequate volume   Culture   Final    NO GROWTH 2 DAYS Performed at [facility], [address], [place], [place] [address]    Report Status PENDING  Incomplete  Blood culture (routine x 2)     Status: None (Preliminary result)   Collection Time: 09/11/23 11:35 PM   Specimen: BLOOD  Result Value Ref Range Status   Specimen Description BLOOD BLOOD LEFT HAND  Final   Special Requests   Final    BOTTLES DRAWN AEROBIC AND ANAEROBIC Blood Culture adequate volume   Culture   Final    NO GROWTH 2 DAYS Performed at [facility], [address], [place], [place] [address]    Report Status PENDING  Incomplete   Today   Subjective     Reinier Lienhart today has no -New complaints  -Voiding well affect is appropriate, oriented x3, -Mentation back to normal -Patient is alert, oriented, coherent and appropriate - Patient refusing to go to SNF rehab, patient requesting discharge home with [facility] services   Patient has been seen and examined prior to discharge   Objective   Blood pressure (!) 142/87, pulse (!) 102, temperature 98.3 F (36.8 C), resp. rate 18, height 5\' 7"  (1.702 m), weight 79.5 kg, SpO2 92%.  Intake/Output Summary (Last 24 hours) at 09/13/2023 1144 Last data filed at 09/13/2023 0900 Gross per 24 hour  Intake 514.81 ml  Output --  Net 514.81 ml    Exam Gen:- Awake Alert, no acute distress  HEENT:- [place].AT, No sclera icterus Neck-Supple Neck,No JVD,.  Lungs-  CTAB , good air movement bilaterally CV- S1, S2 normal, regular Abd-  +ve B.Sounds, Abd Soft, No tenderness,    Extremity/Skin:- No  edema,   good pulses Psych-affect is appropriate, oriented x3, -Mentation back to normal -Patient is alert, oriented, coherent and appropriate Neuro-generalized weakness, no new focal deficits, no tremors  MSK--wearing monitor on his leg ((patient is registered sex offender)   Data Review   CBC w Diff:  Lab Results  Component Value Date   WBC 12.8 (H) 09/13/2023   HGB 13.7 09/13/2023   HGB 12.0 (L) 05/22/2023   HCT 41.5 09/13/2023   HCT 36.2 (L) 05/22/2023   PLT 182 09/13/2023   PLT 178 05/22/2023   LYMPHOPCT 19 08/04/2021   MONOPCT 8 08/04/2021   EOSPCT 4 08/04/2021   BASOPCT 1 08/04/2021   CMP:  Lab Results  Component Value Date   NA 146 (H) 09/13/2023   NA 142 05/22/2023   K 3.5 09/13/2023   CL 102 09/13/2023   CO2 32 09/13/2023   BUN 58 (H) 09/13/2023   BUN 27 05/22/2023   CREATININE 2.66 (H) 09/13/2023   CREATININE 1.09 03/01/2021   PROT 8.2 (H) 09/11/2023   PROT 7.7 05/22/2023   ALBUMIN 4.4 09/11/2023   ALBUMIN 4.6 05/22/2023   BILITOT 1.0 09/11/2023   BILITOT 0.8 05/22/2023    ALKPHOS 69 09/11/2023   AST 57 (H) 09/11/2023   ALT 26 09/11/2023   Total Discharge time is about 33 minutes  Shon Hale M.D on 09/13/2023 at 11:44 AM  Go to [email or web address] -  for contact info  [facility] - Office  [phone]

## 2023-09-14 ENCOUNTER — Telehealth: Payer: Self-pay

## 2023-09-14 NOTE — Transitions of Care (Post Inpatient/ED Visit) (Signed)
09/14/2023  Name: Collin Yoder MRN: [id number] DOB: [birth year]  Today's TOC FU Call Status: Today's TOC FU Call Status:: Successful TOC FU Call Completed TOC FU Call Complete Date: 09/14/23 Patient's Name and Date of Birth confirmed.  Transition Care Management Follow-up Telephone Call Date of Discharge: 09/13/23 [facility] Type of Discharge: Inpatient Admission How have you been since you were released from the hospital?: Better (Reports feelings, reports that he is just sleepy) Any questions or concerns?: No  Items Reviewed: Did you receive and understand the discharge instructions provided?: Yes Medications obtained,verified, and reconciled?: Yes (Medications Reviewed) Any new allergies since your discharge?: No Dietary orders reviewed?: Yes Type of Diet Ordered:: low salt heart healthy diet Do you have support at home?: Yes People in Home: friend(s) Name of Support/Comfort Primary Source: friend  Medications Reviewed Today: Medications Reviewed Today     Reviewed by Earlie Server, RN (Registered Nurse) on 09/14/23 at 1109  Med List Status: <None>   Medication Order Taking? Sig Documenting Provider Last Dose Status Informant  Alcohol Swabs (B-D SINGLE USE SWABS REGULAR) PADS [id number] Yes 1 Units by Does not apply route daily at 12 noon. Anabel Halon, MD Taking Active Self, Pharmacy Records  amLODipine [facility]) 10 MG tablet [id number] Yes Take 1 tablet (10 mg total) by mouth daily. Shon Hale, MD Taking Active   aspirin 81 MG chewable tablet [id number] Yes Chew 1 tablet (81 mg total) by mouth daily. Shon Hale, MD Taking Active   atorvastatin (LIPITOR) 40 MG tablet [id number] Yes Take 1 tablet (40 mg total) by mouth daily. Shon Hale, MD Taking Active   Blood Glucose Monitoring Suppl (TRUE METRIX METER) w/Device KIT [id number] Yes 1 kit by Does not apply route See admin instructions. Anabel Halon, MD Taking Active Self,  Pharmacy Records  Cholecalciferol (VITAMIN D3) 5000 units CAPS [id number] Yes Take 1 capsule by mouth daily. [provider] Taking Active Self, Pharmacy Records  furosemide (LASIX) 20 MG tablet [id number] Yes Take 1 tablet (20 mg total) by mouth daily as needed for fluid or edema (shortness  of breath). Shon Hale, MD Taking Active   gabapentin (NEURONTIN) 300 MG capsule [id number] Yes Take 1 capsule (300 mg total) by mouth 3 (three) times daily. Anabel Halon, MD Taking Active Self, Pharmacy Records  glimepiride [facility]) 4 MG tablet [id number] Yes Take 1 tablet (4 mg total) by mouth 2 (two) times daily before a meal. Emokpae, Courage, MD Taking Active   glucose blood (TRUE METRIX BLOOD GLUCOSE TEST) test strip [id number] Yes Use as instructed Anabel Halon, MD Taking Active Self, Pharmacy Records  insulin glargine (LANTUS SOLOSTAR) 100 UNIT/ML Solostar Pen [id number] Yes Inject 10 Units into the skin daily. Shon Hale, MD Taking Active   Insulin Pen Needle (PEN NEEDLES) 32G X 4 MM MISC [id number] Yes Use as directed for injecting insulin Anabel Halon, MD Taking Active Self, Pharmacy Records  olmesartan [facility]) 5 MG tablet [id number] Yes Take 1 tablet (5 mg total) by mouth daily. Shon Hale, MD Taking Active   Semaglutide, 2 MG/DOSE, 8 MG/3ML SOPN [id number] Yes Inject 2 mg as directed every Friday. Dose change - 08/29/23-----Once Weekly Shon Hale, MD Taking Active   TRUEplus Lancets 33G MISC [id number] Yes USE TO TEST DAILY AT 12 NOON. Anabel Halon, MD Taking Active Self, Pharmacy Records            Home Care and Equipment/Supplies: Were Home Health Services Ordered?: No Any  new equipment or medical supplies ordered?: No  Functional Questionnaire: Do you need assistance with bathing/showering or dressing?: No Do you need assistance with meal preparation?: No Do you need assistance with eating?: No Do you have difficulty maintaining continence: No Do  you need assistance with getting out of bed/getting out of a chair/moving?: No Do you have difficulty managing or taking your medications?: No  Follow up appointments reviewed: PCP Follow-up appointment confirmed?: Yes Date of PCP follow-up appointment?: 09/19/23 Follow-up Provider: [facility]CP [facility] Hospital Follow-up appointment confirmed?: No Do you need transportation to your follow-up appointment?: No Do you understand care options if your condition(s) worsen?: Yes-patient verbalized understanding  SDOH Interventions Today    Flowsheet Row Most Recent Value  SDOH Interventions   Food Insecurity Interventions Intervention Not Indicated  Housing Interventions Intervention Not Indicated  Transportation Interventions Intervention Not Indicated  Utilities Interventions Intervention Not Indicated      Spoke with patient who states that he is doing well. Reports that he has good support with friends and neighbors.    Reviewed importance of follow up with PCP and to have labs. Reviewed DM control with patient and the importance of taking all medications as prescribed.   Lonia Chimera, RN, BSN, CEN [employer]- Transition of Care Team.  [facility] [phone]

## 2023-09-16 LAB — CULTURE, BLOOD (ROUTINE X 2)
Culture: NO GROWTH
Culture: NO GROWTH
Special Requests: ADEQUATE
Special Requests: ADEQUATE

## 2023-09-19 ENCOUNTER — Ambulatory Visit: Payer: Medicare HMO | Admitting: Internal Medicine

## 2023-09-27 ENCOUNTER — Encounter: Payer: Self-pay | Admitting: Internal Medicine

## 2023-09-27 ENCOUNTER — Ambulatory Visit: Payer: Self-pay | Admitting: Internal Medicine

## 2023-09-27 ENCOUNTER — Ambulatory Visit: Payer: Medicare HMO | Admitting: Internal Medicine

## 2023-09-27 VITALS — BP 118/72 | HR 85 | Ht 67.0 in

## 2023-09-27 DIAGNOSIS — Z09 Encounter for follow-up examination after completed treatment for conditions other than malignant neoplasm: Secondary | ICD-10-CM

## 2023-09-27 DIAGNOSIS — R4182 Altered mental status, unspecified: Secondary | ICD-10-CM

## 2023-09-27 DIAGNOSIS — E1142 Type 2 diabetes mellitus with diabetic polyneuropathy: Secondary | ICD-10-CM

## 2023-09-27 DIAGNOSIS — R296 Repeated falls: Secondary | ICD-10-CM | POA: Diagnosis not present

## 2023-09-27 DIAGNOSIS — N184 Chronic kidney disease, stage 4 (severe): Secondary | ICD-10-CM | POA: Diagnosis not present

## 2023-09-27 DIAGNOSIS — R269 Unspecified abnormalities of gait and mobility: Secondary | ICD-10-CM | POA: Diagnosis not present

## 2023-09-27 DIAGNOSIS — I5033 Acute on chronic diastolic (congestive) heart failure: Secondary | ICD-10-CM

## 2023-09-27 DIAGNOSIS — E1165 Type 2 diabetes mellitus with hyperglycemia: Secondary | ICD-10-CM

## 2023-09-27 DIAGNOSIS — I10 Essential (primary) hypertension: Secondary | ICD-10-CM

## 2023-09-27 DIAGNOSIS — R110 Nausea: Secondary | ICD-10-CM | POA: Diagnosis not present

## 2023-09-27 MED ORDER — METOCLOPRAMIDE HCL 5 MG PO TABS: 5.0000 mg | ORAL_TABLET | Freq: Three times a day (TID) | ORAL | 1 refills | Status: AC | PRN

## 2023-09-27 NOTE — Patient Instructions (Signed)
Please take Reglan as needed for nausea.  Please follow small, frequent meals.  Please continue to take medications as prescribed.  Please continue to follow low carb diet and ambulate as tolerated.

## 2023-09-27 NOTE — Telephone Encounter (Signed)
Copied from CRM [id number]. Topic: Clinical - Medical Advice >> Sep 27, 2023  8:34 AM Fuller Mandril wrote: Reason for CRM: Pt called in stated he has an appt and has been added to wait list but would like to speak with a provider to see what is suggested. Diabetic pt not able to keep any food down. Was recently released from hospital. Thank You   Chief Complaint: Nausea/Vomiting Symptoms: Nausea/vomiting, left hip pain Frequency: x 10-12 days Pertinent Negatives: Patient denies Chest pain, abdominal pain, headache, vertigo, vomiting blood or coffee-ground like material Disposition: [x] ED /[] Urgent Care (no appt availability in office) / [] Appointment(In office/virtual)/ []  [facility]/ [] Home Care/ [x] Refused Recommended Disposition /[] [place] [facility]/ [x]  Follow-up with PCP Additional Notes: Patient states that he is having nausea/vomiting that has been going on since he got out of the hospital 10-12 days ago.  Patient states he can hardly keep water down either.  He has not vomited in the past 24 hours but he also has not taken in any fluids or food for the fear of vomiting.  Patient is alert & oriented on the phone and able to answer all questions.  Patient states that in the past two weeks he has fallen 3 times and he states that he hit his head the first time.  He advises that for the past week he has been walking with a walker.  He denies any chest pain, diarrhea, vomiting up blood or coffee ground-appearing substances, and he denies any abdominal pain.  Patient denies a headache but states that when he fell and hit head a week ago he had some blood on his head.  Patient also states that the last time he fell, he had some left pain afterwards.  Patient did check his blood sugar level while on the phone with this RN and it was 142.  Patient wanted to know if his PCP could give him something for the nausea/vomiting.  Patient is advised that with his medical history, persistent  nausea/vomiting, falling three times including hitting his head, and having to use a walker now that he go to the emergency room to have all these things assessed by a provider immediately.  Patient states that he will see about making arrangements to go to the ER and that it may be tomorrow.  Patient states he did not want to go to the hospital by ambulance and that he wanted to hear back from Dr Allena Katz on if he can get something for nausea/vomiting.  Patient is strongly advised that going to the Emergency Room asap is highly recommended.  Patient does have someone with him so he is not alone and they have been helping to take care of him.  Patient is advised that if anything gets worse to go ahead and call 911 immediately so he can be seen at the Emergency Room.  Patient verbalized understanding and wants a call back after his PCP has seen this information.  Reason for Disposition  High-risk adult (e.g., diabetes mellitus, brain tumor, V-P shunt, hernia)  Answer Assessment - Initial Assessment Questions 1. VOMITING SEVERITY: "How many times have you vomited in the past 24 hours?"     - MILD:  1 - 2 times/day    - MODERATE: 3 - 5 times/day, decreased oral intake without significant weight loss or symptoms of dehydration    - SEVERE: 6 or more times/day, vomits everything or nearly everything, with significant weight loss, symptoms of dehydration  None but he hasn't eaten or had any fluids 2. ONSET: "When did the vomiting begin?"      When he got home from the hospital 3. FLUIDS: "What fluids or food have you vomited up today?" "Have you been able to keep any fluids down?"     Patient hasn't tried to eat or drink anything today 4. ABDOMEN PAIN: "Are your having any abdomen pain?" If Yes : "How bad is it and what does it feel like?" (e.g., crampy, dull, intermittent, constant)      Denies 5. DIARRHEA: "Is there any diarrhea?" If Yes, ask: "How many times today?"      Denies 6. CONTACTS: "Is  there anyone else in the family with the same symptoms?"      Denies 7. CAUSE: "What do you think is causing your vomiting?"     Denies 8. HYDRATION STATUS: "Any signs of dehydration?" (e.g., dry mouth [not only dry lips], too weak to stand) "When did you last urinate?"     Patient states mouth sometimes gets dry and he has been trying to drink water but sometimes he throws that up too 9. OTHER SYMPTOMS: "Do you have any other symptoms?" (e.g., fever, headache, vertigo, vomiting blood or coffee grounds, recent head injury)     Denies all but patient did state he fell and hit his head about a week ago  Protocols used: Vomiting-A-AH

## 2023-09-27 NOTE — Telephone Encounter (Signed)
Appt scheduled. Patient aware. 

## 2023-09-28 ENCOUNTER — Other Ambulatory Visit: Payer: Self-pay | Admitting: Internal Medicine

## 2023-09-28 DIAGNOSIS — R110 Nausea: Secondary | ICD-10-CM | POA: Insufficient documentation

## 2023-09-28 DIAGNOSIS — N179 Acute kidney failure, unspecified: Secondary | ICD-10-CM

## 2023-09-28 DIAGNOSIS — R296 Repeated falls: Secondary | ICD-10-CM | POA: Insufficient documentation

## 2023-09-28 DIAGNOSIS — Z09 Encounter for follow-up examination after completed treatment for conditions other than malignant neoplasm: Secondary | ICD-10-CM | POA: Insufficient documentation

## 2023-09-28 LAB — BASIC METABOLIC PANEL
BUN/Creatinine Ratio: 13 (ref 10–24)
BUN: 68 mg/dL — ABNORMAL HIGH (ref 8–27)
CO2: 27 mmol/L (ref 20–29)
Calcium: 9.3 mg/dL (ref 8.6–10.2)
Chloride: 90 mmol/L — ABNORMAL LOW (ref 96–106)
Creatinine, Ser: 5.1 mg/dL — ABNORMAL HIGH (ref 0.76–1.27)
Glucose: 132 mg/dL — ABNORMAL HIGH (ref 70–99)
Potassium: 4.1 mmol/L (ref 3.5–5.2)
Sodium: 139 mmol/L (ref 134–144)
eGFR: 11 mL/min/{1.73_m2} — ABNORMAL LOW (ref >59)

## 2023-09-28 LAB — CBC WITH DIFFERENTIAL/PLATELET
Basophils Absolute: 0.1 10*3/uL (ref 0.0–0.2)
Basos: 1 %
EOS (ABSOLUTE): 0.2 10*3/uL (ref 0.0–0.4)
Eos: 1 %
Hematocrit: 36.5 % — ABNORMAL LOW (ref 37.5–51.0)
Hemoglobin: 12 g/dL — ABNORMAL LOW (ref 13.0–17.7)
Immature Grans (Abs): 0 10*3/uL (ref 0.0–0.1)
Immature Granulocytes: 0 %
Lymphocytes Absolute: 2.9 10*3/uL (ref 0.7–3.1)
Lymphs: 25 %
MCH: 31.5 pg (ref 26.6–33.0)
MCHC: 32.9 g/dL (ref 31.5–35.7)
MCV: 96 fL (ref 79–97)
Monocytes Absolute: 0.7 10*3/uL (ref 0.1–0.9)
Monocytes: 6 %
Neutrophils Absolute: 7.8 10*3/uL — ABNORMAL HIGH (ref 1.4–7.0)
Neutrophils: 67 %
Platelets: 180 10*3/uL (ref 150–450)
RBC: 3.81 x10E6/uL — ABNORMAL LOW (ref 4.14–5.80)
RDW: 12 % (ref 11.6–15.4)
WBC: 11.7 10*3/uL — ABNORMAL HIGH (ref 3.4–10.8)

## 2023-09-28 NOTE — Assessment & Plan Note (Signed)
Unclear if related to diabetic gastroparesis Reglan prescribed Continue small, frequent meals

## 2023-09-28 NOTE — Assessment & Plan Note (Signed)
Hospital chart reviewed, including discharge summary Medications reconciled and reviewed with the patient in detail 

## 2023-09-28 NOTE — Assessment & Plan Note (Signed)
BP Readings from Last 1 Encounters:  09/27/23 118/72   Well-controlled with Amlodipine 10 mg QD and olmesartan 5 mg QD Counseled for compliance with the medications Advised DASH diet and moderate exercise/walking as tolerated

## 2023-09-28 NOTE — Assessment & Plan Note (Addendum)
Lab Results  Component Value Date   HGBA1C 11.4 (H) 09/11/2023   Uncontrolled due to diet noncompliance On Glimepiride 4 mg twice daily On Ozempic 2 mg qw - if persistent nausea, will hold Ozempic for now due to severe nausea, can restart later Recently started Lantus 10 units nightly Was on metformin, which was discontinued recently due to AKI Advised to follow diabetic diet, diet material provided Advised to check blood glucose regularly and bring the log in the next visit-contact if blood glucose more than 300 or less than 70 On statin  F/u BMP and HbA1C Diabetic eye exam: Advised to follow up with Ophthalmology for diabetic eye exam  On gabapentin 300 mg 3 times daily for diabetic neuropathy

## 2023-09-28 NOTE — Assessment & Plan Note (Signed)
Was given IV Lasix during hospitalization Has Lasix as needed for leg swelling Currently appears hypovolemic rather than hypervolemic, likely from recent nausea and vomiting

## 2023-09-28 NOTE — Assessment & Plan Note (Signed)
Likely due to acute metabolic encephalopathy, from uncontrolled type II DM, and dehydration Now improved

## 2023-09-28 NOTE — Assessment & Plan Note (Signed)
Last BMP reviewed -  rechecked BMP, has AKI on CKD, likely due to dehydration from recent nausea/vomiting Advised to go to ER if unable to tolerate p.o. intake of fluid, but he denies for now - strongly emphasized need to go to ER if persistent nausea/vomiting Recheck BMP after 1 week Followed by Nephrology Avoid nephrotoxic agents Advised to maintain adequate hydration

## 2023-09-28 NOTE — Assessment & Plan Note (Addendum)
Likely due to dehydration and physical deconditioning Needs to maintain adequate hydration, he expressed understanding He had refused skilled nursing facility, needs home PT-referred to home health for strength and gait training

## 2023-09-28 NOTE — Progress Notes (Signed)
Established Patient Office Visit  Subjective:  Patient ID: Collin Yoder, male    DOB: 06-17-1951  Age: 72 y.o. MRN: [id number]  CC:  Chief Complaint  Patient presents with   Hospitalization Follow-up    Hospital Follow Up     HPI Collin Yoder is a 72 y.o. male with past medical history of CVA, HTN, COPD, type II DM with neuropathy and tobacco abuse who presents for follow-up after recent hospitalization from 09/11/23-09/13/23.  He presented from [facility] via EMS to the ED due to altered mental status. Reportedly the patient had complained of shortness of breath previously.  On presentation, he was alert and confused, oriented to self only.  He was found to have tachycardic with elevated BNP.  Noncontrast head CT was negative for any acute changes.  Chest x-ray was negative for any acute infection.  UA and UDS were also unremarkable.  He has history of uncontrolled type II DM and CKD stage IV.  He was given IV Lasix for diuresis, which had to stopped later due to elevation in S Cr.  He was advised to go to skilled nursing facility upon discharge, but he denied. He has not been able to start home PT as well.  He has been feeling generalized weakness and has had 3 falls since getting back to home/motel.  He has been having nausea and NBNB vomiting spells as well.  He has not eaten since yesterday, and states that he has felt better this morning.  His blood glucose was around 140 this morning.  He is alert and oriented X 3 today.  Past Medical History:  Diagnosis Date   Borderline diabetes    Peripheral neuropathy 07/15/2019   Right lower lobe lung mass 04/11/2017   Thalamic stroke (HCC) 07/15/2019   Type II diabetes mellitus, uncontrolled 07/15/2019   Vitamin D deficiency disease 07/15/2019    Past Surgical History:  Procedure Laterality Date   APPENDECTOMY     CATARACT EXTRACTION, BILATERAL Bilateral 01/2021    Family History  Problem Relation Age of Onset    Diabetes Mother 17   Stroke Mother    Colon cancer Neg Hx     Social History   Socioeconomic History   Marital status: Single    Spouse name: Not on file   Number of children: Not on file   Years of education: Not on file   Highest education level: Not on file  Occupational History   Not on file  Tobacco Use   Smoking status: Every Day    Current packs/day: 1.00    Average packs/day: 1 pack/day for 45.0 years (45.0 ttl pk-yrs)    Types: Cigarettes   Smokeless tobacco: Never   Tobacco comments:    Down to 1/2 ppd  Vaping Use   Vaping status: Never Used  Substance and Sexual Activity   Alcohol use: Yes    Alcohol/week: 1.0 standard drink of alcohol    Types: 1 Cans of beer per week    Comment: every 3 days   Drug use: No   Sexual activity: Not on file  Other Topics Concern   Not on file  Social History Narrative   Single.Lives alone in a motel.Works at [employer].   Social Drivers of Health   Financial Resource Strain: Low Risk  (03/15/2022)   Overall Financial Resource Strain (CARDIA)    Difficulty of Paying Living Expenses: Not hard at all  Food Insecurity: No Food Insecurity (09/14/2023)  Hunger Vital Sign    Worried About Running Out of Food in the Last Year: Never true    Ran Out of Food in the Last Year: Never true  Transportation Needs: No Transportation Needs (09/14/2023)   PRAPARE - Administrator, Civil Service (Medical): No    Lack of Transportation (Non-Medical): No  Physical Activity: Sufficiently Active (03/15/2022)   Exercise Vital Sign    Days of Exercise per Week: 7 days    Minutes of Exercise per Session: 50 min  Stress: No Stress Concern Present (03/15/2022)   [employer] of Occupational Health - Occupational Stress Questionnaire    Feeling of Stress : Not at all  Social Connections: Socially Isolated (03/15/2022)   Social Connection and Isolation Panel [NHANES]    Frequency of Communication with Friends and Family: More  than three times a week    Frequency of Social Gatherings with Friends and Family: More than three times a week    Attends Religious Services: Never    Database administrator or Organizations: No    Attends Banker Meetings: Never    Marital Status: Divorced  Catering manager Violence: Not At Risk (09/14/2023)   Humiliation, Afraid, Rape, and Kick questionnaire    Fear of Current or Ex-Partner: No    Emotionally Abused: No    Physically Abused: No    Sexually Abused: No    Outpatient Medications Prior to Visit  Medication Sig Dispense Refill   Alcohol Swabs (B-D SINGLE USE SWABS REGULAR) PADS 1 Units by Does not apply route daily at 12 noon. 100 each 1   amLODipine (NORVASC) 10 MG tablet Take 1 tablet (10 mg total) by mouth daily. 90 tablet 3   aspirin 81 MG chewable tablet Chew 1 tablet (81 mg total) by mouth daily. 30 tablet 11   atorvastatin (LIPITOR) 40 MG tablet Take 1 tablet (40 mg total) by mouth daily. 90 tablet 3   Blood Glucose Monitoring Suppl (TRUE METRIX METER) w/Device KIT 1 kit by Does not apply route See admin instructions. 1 kit 0   Cholecalciferol (VITAMIN D3) 5000 units CAPS Take 1 capsule by mouth daily.     furosemide (LASIX) 20 MG tablet Take 1 tablet (20 mg total) by mouth daily as needed for fluid or edema (shortness  of breath). 30 tablet 1   gabapentin (NEURONTIN) 300 MG capsule Take 1 capsule (300 mg total) by mouth 3 (three) times daily. 270 capsule 0   glimepiride (AMARYL) 4 MG tablet Take 1 tablet (4 mg total) by mouth 2 (two) times daily before a meal. 60 tablet 3   glucose blood (TRUE METRIX BLOOD GLUCOSE TEST) test strip Use as instructed 100 each 12   insulin glargine (LANTUS SOLOSTAR) 100 UNIT/ML Solostar Pen Inject 10 Units into the skin daily. 3 mL 3   Insulin Pen Needle (PEN NEEDLES) 32G X 4 MM MISC Use as directed for injecting insulin 100 each 2   olmesartan (BENICAR) 5 MG tablet Take 1 tablet (5 mg total) by mouth daily. 30 tablet 11    Semaglutide, 2 MG/DOSE, 8 MG/3ML SOPN Inject 2 mg as directed every Friday. Dose change - 08/29/23-----Once Weekly 3 mL 3   TRUEplus Lancets 33G MISC USE TO TEST DAILY AT 12 NOON. 100 each 10   No facility-administered medications prior to visit.    No Known Allergies  ROS Review of Systems  Constitutional:  Positive for fatigue. Negative for chills and fever.  HENT:  Negative for congestion and sore throat.   Eyes:  Negative for pain and discharge.  Respiratory:  Negative for cough and shortness of breath.   Cardiovascular:  Negative for chest pain and palpitations.  Gastrointestinal:  Positive for nausea and vomiting. Negative for diarrhea.  Endocrine: Negative for polydipsia and polyuria.  Genitourinary:  Negative for dysuria and hematuria.  Musculoskeletal:  Negative for neck pain and neck stiffness.  Skin:  Negative for rash.       Neck mass  Neurological:  Positive for dizziness, tremors, weakness and numbness. Negative for headaches.  Psychiatric/Behavioral:  Negative for agitation and behavioral problems.       Objective:    Physical Exam Vitals reviewed.  Constitutional:      General: He is not in acute distress.    Appearance: He is not diaphoretic.     Comments: In wheelchair  HENT:     Head: Normocephalic and atraumatic.     Nose: Nose normal.     Mouth/Throat:     Mouth: Mucous membranes are moist.  Eyes:     General: No scleral icterus.    Extraocular Movements: Extraocular movements intact.  Neck:     Comments: Posterior neck mass - oval shaped, about 1 cm X 2 cm, nontender Cardiovascular:     Rate and Rhythm: Normal rate and regular rhythm.     Heart sounds: Normal heart sounds. No murmur heard. Pulmonary:     Breath sounds: Normal breath sounds. No wheezing or rales.  Abdominal:     Palpations: Abdomen is soft.     Tenderness: There is no abdominal tenderness.  Musculoskeletal:     Cervical back: Neck supple. No tenderness.     Right lower  leg: No edema.     Left lower leg: No edema.  Skin:    General: Skin is warm.     Findings: No rash.  Neurological:     General: No focal deficit present.     Mental Status: He is alert and oriented to person, place, and time.     Sensory: Sensory deficit (B/l feet) present.     Motor: Weakness (B/l LE - 3/5) present.     Comments: Resting tremors of right hand  Psychiatric:        Mood and Affect: Mood normal.        Behavior: Behavior normal.     BP 118/72 (BP Location: Left Arm, Patient Position: Sitting, Cuff Size: Normal)   Pulse 85   Ht 5\' 7"  (1.702 m)   SpO2 90%   BMI 27.45 kg/m  Wt Readings from Last 3 Encounters:  09/13/23 175 lb 4.3 oz (79.5 kg)  09/10/23 192 lb 14.4 oz (87.5 kg)  08/29/23 192 lb 9.6 oz (87.4 kg)    Lab Results  Component Value Date   TSH 3.937 09/11/2023   Lab Results  Component Value Date   WBC 11.7 (H) 09/27/2023   HGB 12.0 (L) 09/27/2023   HCT 36.5 (L) 09/27/2023   MCV 96 09/27/2023   PLT 180 09/27/2023   Lab Results  Component Value Date   NA 139 09/27/2023   K 4.1 09/27/2023   CO2 27 09/27/2023   GLUCOSE 132 (H) 09/27/2023   BUN 68 (H) 09/27/2023   CREATININE 5.10 (H) 09/27/2023   BILITOT 1.0 09/11/2023   ALKPHOS 69 09/11/2023   AST 57 (H) 09/11/2023   ALT 26 09/11/2023   PROT 8.2 (H) 09/11/2023   ALBUMIN 4.4 09/11/2023   CALCIUM  9.3 09/27/2023   ANIONGAP 12 09/13/2023   EGFR 11 (L) 09/27/2023   Lab Results  Component Value Date   CHOL 102 09/12/2023   Lab Results  Component Value Date   HDL 31 (L) 09/12/2023   Lab Results  Component Value Date   LDLCALC 45 09/12/2023   Lab Results  Component Value Date   TRIG 129 09/12/2023   Lab Results  Component Value Date   CHOLHDL 3.3 09/12/2023   Lab Results  Component Value Date   HGBA1C 11.4 (H) 09/11/2023      Assessment & Plan:   Problem List Items Addressed This Visit       Cardiovascular and Mediastinum   Hypertension   BP Readings from Last 1  Encounters:  09/27/23 118/72   Well-controlled with Amlodipine 10 mg QD and olmesartan 5 mg QD Counseled for compliance with the medications Advised DASH diet and moderate exercise/walking as tolerated      Acute on chronic diastolic CHF (congestive heart failure) (HCC)   Was given IV Lasix during hospitalization Has Lasix as needed for leg swelling Currently appears hypovolemic rather than hypervolemic, likely from recent nausea and vomiting         Endocrine   Type 2 diabetes mellitus with hyperglycemia (HCC)   Lab Results  Component Value Date   HGBA1C 11.4 (H) 09/11/2023   Uncontrolled due to diet noncompliance On Glimepiride 4 mg twice daily On Ozempic 2 mg qw - if persistent nausea, will hold Ozempic for now due to severe nausea, can restart later Recently started Lantus 10 units nightly Was on metformin, which was discontinued recently due to AKI Advised to follow diabetic diet, diet material provided Advised to check blood glucose regularly and bring the log in the next visit-contact if blood glucose more than 300 or less than 70 On statin  F/u BMP and HbA1C Diabetic eye exam: Advised to follow up with Ophthalmology for diabetic eye exam  On gabapentin 300 mg 3 times daily for diabetic neuropathy        Genitourinary   Stage 4 chronic kidney disease (HCC)   Last BMP reviewed -  rechecked BMP, has AKI on CKD, likely due to dehydration from recent nausea/vomiting Advised to go to ER if unable to tolerate p.o. intake of fluid, but he denies for now - strongly emphasized need to go to ER if persistent nausea/vomiting Recheck BMP after 1 week Followed by Nephrology Avoid nephrotoxic agents Advised to maintain adequate hydration      Relevant Orders   CBC with Differential/Platelet (Completed)   Basic Metabolic Panel (BMET) (Completed)     Other   AMS (altered mental status) - Primary   Likely due to acute metabolic encephalopathy, from uncontrolled type II DM,  and dehydration Now improved      Relevant Orders   CBC with Differential/Platelet (Completed)   Basic Metabolic Panel (BMET) (Completed)   Hospital discharge follow-up   Hospital chart reviewed, including discharge summary Medications reconciled and reviewed with the patient in detail      Nausea   Unclear if related to diabetic gastroparesis Reglan prescribed Continue small, frequent meals      Relevant Medications   metoCLOPramide (REGLAN) 5 MG tablet   Recurrent falls   Likely due to dehydration and physical deconditioning Needs to maintain adequate hydration, he expressed understanding He had refused skilled nursing facility, needs home PT-referred to home health for strength and gait training       Relevant Orders  Ambulatory referral to Home Health   CBC with Differential/Platelet (Completed)   Basic Metabolic Panel (BMET) (Completed)   Other Visit Diagnoses       Gait disturbance       Relevant Orders   Ambulatory referral to Home Health       Meds ordered this encounter  Medications   metoCLOPramide (REGLAN) 5 MG tablet    Sig: Take 1 tablet (5 mg total) by mouth every 8 (eight) hours as needed for nausea.    Dispense:  30 tablet    Refill:  1    Follow-up: Return if symptoms worsen or fail to improve.    Anabel Halon, MD

## 2023-10-04 ENCOUNTER — Other Ambulatory Visit: Payer: Self-pay

## 2023-10-04 DIAGNOSIS — E1165 Type 2 diabetes mellitus with hyperglycemia: Secondary | ICD-10-CM

## 2023-10-04 MED ORDER — TRUEPLUS LANCETS 33G MISC: 1.0000 | Freq: Every day | 10 refills | Status: AC

## 2023-10-04 MED ORDER — TRUE METRIX BLOOD GLUCOSE TEST VI STRP: ORAL_STRIP | 12 refills | Status: DC

## 2023-10-23 ENCOUNTER — Other Ambulatory Visit: Payer: Self-pay | Admitting: Internal Medicine

## 2023-10-23 DIAGNOSIS — G63 Polyneuropathy in diseases classified elsewhere: Secondary | ICD-10-CM

## 2023-10-31 ENCOUNTER — Other Ambulatory Visit: Payer: Self-pay

## 2023-10-31 DIAGNOSIS — E1142 Type 2 diabetes mellitus with diabetic polyneuropathy: Secondary | ICD-10-CM

## 2023-10-31 DIAGNOSIS — E1165 Type 2 diabetes mellitus with hyperglycemia: Secondary | ICD-10-CM

## 2023-10-31 MED ORDER — TRUE METRIX BLOOD GLUCOSE TEST VI STRP: ORAL_STRIP | 12 refills | Status: AC

## 2023-10-31 MED ORDER — TRUE METRIX METER W/DEVICE KIT: 1.0000 | PACK | 0 refills | Status: AC

## 2023-11-06 ENCOUNTER — Ambulatory Visit: Payer: Medicare HMO | Admitting: Internal Medicine

## 2023-11-09 ENCOUNTER — Encounter: Payer: Self-pay | Admitting: Emergency Medicine

## 2023-12-10 ENCOUNTER — Encounter: Payer: Self-pay | Admitting: Internal Medicine

## 2023-12-10 ENCOUNTER — Ambulatory Visit: Payer: Self-pay | Admitting: Internal Medicine

## 2023-12-10 VITALS — BP 126/63 | HR 96 | Ht 67.0 in | Wt 192.8 lb

## 2023-12-10 DIAGNOSIS — I5032 Chronic diastolic (congestive) heart failure: Secondary | ICD-10-CM

## 2023-12-10 DIAGNOSIS — I700 Atherosclerosis of aorta: Secondary | ICD-10-CM

## 2023-12-10 DIAGNOSIS — N2581 Secondary hyperparathyroidism of renal origin: Secondary | ICD-10-CM | POA: Diagnosis not present

## 2023-12-10 DIAGNOSIS — E1165 Type 2 diabetes mellitus with hyperglycemia: Secondary | ICD-10-CM

## 2023-12-10 DIAGNOSIS — E1142 Type 2 diabetes mellitus with diabetic polyneuropathy: Secondary | ICD-10-CM

## 2023-12-10 DIAGNOSIS — N184 Chronic kidney disease, stage 4 (severe): Secondary | ICD-10-CM

## 2023-12-10 DIAGNOSIS — Z7984 Long term (current) use of oral hypoglycemic drugs: Secondary | ICD-10-CM | POA: Diagnosis not present

## 2023-12-10 DIAGNOSIS — I10 Essential (primary) hypertension: Secondary | ICD-10-CM

## 2023-12-10 MED ORDER — LANTUS SOLOSTAR 100 UNIT/ML ~~LOC~~ SOPN: 10.0000 [IU] | PEN_INJECTOR | Freq: Every day | SUBCUTANEOUS | 3 refills | Status: AC

## 2023-12-10 MED ORDER — OZEMPIC (0.25 OR 0.5 MG/DOSE) 2 MG/3ML ~~LOC~~ SOPN
0.5000 mg | PEN_INJECTOR | SUBCUTANEOUS | 1 refills | Status: DC
Start: 2023-12-10 — End: 2024-05-19

## 2023-12-10 MED ORDER — FUROSEMIDE 20 MG PO TABS: 20.0000 mg | ORAL_TABLET | Freq: Every day | ORAL | 1 refills | Status: DC | PRN

## 2023-12-10 NOTE — Progress Notes (Signed)
 Established Patient Office Visit  Subjective:  Patient ID: Collin Yoder, male    DOB: 1950-11-22  Age: 73 y.o. MRN: [id number]  CC:  Chief Complaint  Patient presents with   Care Management    4 month f/u    Diabetes   Congestive Heart Failure    HPI Collin Yoder is a 74 y.o. male with past medical history of CVA, HTN, COPD, type II DM with neuropathy and tobacco abuse who presents for f/u of his chronic medical conditions.  HTN: BP is well-controlled. Takes medications regularly. Patient denies headache, dizziness, chest pain, dyspnea or palpitations.  Diastolic CHF: He has noticed b/l leg swelling for the last few weeks.  Of note, he has not taken Lasix recently.  He still takes olmesartan for his DM.  Denies orthopnea or PND currently.   Type 2 DM: His last HbA1C was 11.4 in 12/24. He is on glimepiride 4 mg BID and Lantus 10 units nightly currently.  He has severe nausea, vomiting and dehydration with Ozempic 2 mg dose, which was discontinued in 12/24. He admits that he has not been following low-carb diet recently.  His blood close has been elevated, mostly above 150, some times above 200.  He also takes Gabapentin for DM neuropathy. He states that he had balance problem due to neuropathy, but has been better since increasing dose of Gabapentin.  CKD: He sees Nephrologist. His last BMP showed GFR of 11, had AKI from severe nausea and vomiting. He did not get repeat BMP as ordered. Denies any dysuria, hematuria or urinary hesitancy/resistance.  Past Medical History:  Diagnosis Date   Borderline diabetes    Peripheral neuropathy 07/15/2019   Right lower lobe lung mass 04/11/2017   Thalamic stroke (HCC) 07/15/2019   Type II diabetes mellitus, uncontrolled 07/15/2019   Vitamin D deficiency disease 07/15/2019    Past Surgical History:  Procedure Laterality Date   APPENDECTOMY     CATARACT EXTRACTION, BILATERAL Bilateral 01/2021    Family History  Problem Relation  Age of Onset   Diabetes Mother 12   Stroke Mother    Colon cancer Neg Hx     Social History   Socioeconomic History   Marital status: Single    Spouse name: Not on file   Number of children: Not on file   Years of education: Not on file   Highest education level: Not on file  Occupational History   Not on file  Tobacco Use   Smoking status: Every Day    Current packs/day: 1.00    Average packs/day: 1 pack/day for 45.0 years (45.0 ttl pk-yrs)    Types: Cigarettes   Smokeless tobacco: Never   Tobacco comments:    Down to 1/2 ppd  Vaping Use   Vaping status: Never Used  Substance and Sexual Activity   Alcohol use: Yes    Alcohol/week: 1.0 standard drink of alcohol    Types: 1 Cans of beer per week    Comment: every 3 days   Drug use: No   Sexual activity: Not on file  Other Topics Concern   Not on file  Social History Narrative   Single.Lives alone in a motel.Works at [employer].   Social Drivers of Health   Financial Resource Strain: Low Risk  (03/15/2022)   Overall Financial Resource Strain (CARDIA)    Difficulty of Paying Living Expenses: Not hard at all  Food Insecurity: No Food Insecurity (09/14/2023)   Hunger Vital Sign  Worried About Programme researcher, broadcasting/film/video in the Last Year: Never true    Ran Out of Food in the Last Year: Never true  Transportation Needs: No Transportation Needs (09/14/2023)   PRAPARE - Administrator, Civil Service (Medical): No    Lack of Transportation (Non-Medical): No  Physical Activity: Sufficiently Active (03/15/2022)   Exercise Vital Sign    Days of Exercise per Week: 7 days    Minutes of Exercise per Session: 50 min  Stress: No Stress Concern Present (03/15/2022)   [employer] of Occupational Health - Occupational Stress Questionnaire    Feeling of Stress : Not at all  Social Connections: Socially Isolated (03/15/2022)   Social Connection and Isolation Panel [NHANES]    Frequency of Communication with Friends  and Family: More than three times a week    Frequency of Social Gatherings with Friends and Family: More than three times a week    Attends Religious Services: Never    Database administrator or Organizations: No    Attends Banker Meetings: Never    Marital Status: Divorced  Catering manager Violence: Not At Risk (09/14/2023)   Humiliation, Afraid, Rape, and Kick questionnaire    Fear of Current or Ex-Partner: No    Emotionally Abused: No    Physically Abused: No    Sexually Abused: No    Outpatient Medications Prior to Visit  Medication Sig Dispense Refill   Alcohol Swabs (B-D SINGLE USE SWABS REGULAR) PADS 1 Units by Does not apply route daily at 12 noon. 100 each 1   amLODipine (NORVASC) 10 MG tablet Take 1 tablet (10 mg total) by mouth daily. 90 tablet 3   aspirin 81 MG chewable tablet Chew 1 tablet (81 mg total) by mouth daily. 30 tablet 11   atorvastatin (LIPITOR) 40 MG tablet Take 1 tablet (40 mg total) by mouth daily. 90 tablet 3   Blood Glucose Monitoring Suppl (TRUE METRIX METER) w/Device KIT 1 kit by Does not apply route See admin instructions. 1 kit 0   Cholecalciferol (VITAMIN D3) 5000 units CAPS Take 1 capsule by mouth daily.     gabapentin (NEURONTIN) 300 MG capsule Take 1 capsule (300 mg total) by mouth 3 (three) times daily. 270 capsule 0   glimepiride (AMARYL) 4 MG tablet Take 1 tablet (4 mg total) by mouth 2 (two) times daily before a meal. 60 tablet 3   glucose blood (TRUE METRIX BLOOD GLUCOSE TEST) test strip Use as instructed 100 each 12   Insulin Pen Needle (PEN NEEDLES) 32G X 4 MM MISC Use as directed for injecting insulin 100 each 2   metoCLOPramide (REGLAN) 5 MG tablet Take 1 tablet (5 mg total) by mouth every 8 (eight) hours as needed for nausea. 30 tablet 1   olmesartan (BENICAR) 5 MG tablet Take 1 tablet (5 mg total) by mouth daily. 30 tablet 11   TRUEplus Lancets 33G MISC 1 each by Does not apply route daily. 100 each 10   furosemide (LASIX) 20  MG tablet Take 1 tablet (20 mg total) by mouth daily as needed for fluid or edema (shortness  of breath). 30 tablet 1   insulin glargine (LANTUS SOLOSTAR) 100 UNIT/ML Solostar Pen Inject 10 Units into the skin daily. 3 mL 3   Semaglutide, 2 MG/DOSE, 8 MG/3ML SOPN Inject 2 mg as directed every Friday. Dose change - 08/29/23-----Once Weekly 3 mL 3   No facility-administered medications prior to visit.  No Known Allergies  ROS Review of Systems  Constitutional:  Negative for chills and fever.  HENT:  Negative for congestion and sore throat.   Eyes:  Negative for pain and discharge.  Respiratory:  Negative for cough and shortness of breath.   Cardiovascular:  Negative for chest pain and palpitations.  Gastrointestinal:  Negative for diarrhea, nausea and vomiting.  Endocrine: Negative for polydipsia and polyuria.  Genitourinary:  Negative for dysuria and hematuria.  Musculoskeletal:  Negative for neck pain and neck stiffness.  Skin:  Negative for rash.       Neck mass  Neurological:  Positive for tremors, weakness and numbness. Negative for dizziness and headaches.  Psychiatric/Behavioral:  Negative for agitation and behavioral problems.       Objective:    Physical Exam Vitals reviewed.  Constitutional:      General: He is not in acute distress.    Appearance: He is not diaphoretic.  HENT:     Head: Normocephalic and atraumatic.     Nose: Nose normal.     Mouth/Throat:     Mouth: Mucous membranes are moist.  Eyes:     General: No scleral icterus.    Extraocular Movements: Extraocular movements intact.  Neck:     Comments: Posterior neck mass - oval shaped, about 1 cm X 2 cm, nontender Cardiovascular:     Rate and Rhythm: Normal rate and regular rhythm.     Heart sounds: Normal heart sounds. No murmur heard. Pulmonary:     Breath sounds: Normal breath sounds. No wheezing or rales.  Musculoskeletal:     Cervical back: Neck supple. No tenderness.     Right lower leg: No  edema.     Left lower leg: No edema.  Skin:    General: Skin is warm.     Findings: No rash.  Neurological:     General: No focal deficit present.     Mental Status: He is alert and oriented to person, place, and time.     Sensory: Sensory deficit (B/l feet) present.     Motor: Weakness (B/l LE - 4/5) present.     Comments: Resting tremors of right hand  Psychiatric:        Mood and Affect: Mood normal.        Behavior: Behavior normal.     BP 126/63   Pulse 96   Ht 5\' 7"  (1.702 m)   Wt 192 lb 12.8 oz (87.5 kg)   SpO2 94%   BMI 30.20 kg/m  Wt Readings from Last 3 Encounters:  12/10/23 192 lb 12.8 oz (87.5 kg)  09/13/23 175 lb 4.3 oz (79.5 kg)  09/10/23 192 lb 14.4 oz (87.5 kg)    Lab Results  Component Value Date   TSH 3.937 09/11/2023   Lab Results  Component Value Date   WBC 11.7 (H) 09/27/2023   HGB 12.0 (L) 09/27/2023   HCT 36.5 (L) 09/27/2023   MCV 96 09/27/2023   PLT 180 09/27/2023   Lab Results  Component Value Date   NA 139 09/27/2023   K 4.1 09/27/2023   CO2 27 09/27/2023   GLUCOSE 132 (H) 09/27/2023   BUN 68 (H) 09/27/2023   CREATININE 5.10 (H) 09/27/2023   BILITOT 1.0 09/11/2023   ALKPHOS 69 09/11/2023   AST 57 (H) 09/11/2023   ALT 26 09/11/2023   PROT 8.2 (H) 09/11/2023   ALBUMIN 4.4 09/11/2023   CALCIUM 9.3 09/27/2023   ANIONGAP 12 09/13/2023   EGFR  11 (L) 09/27/2023   Lab Results  Component Value Date   CHOL 102 09/12/2023   Lab Results  Component Value Date   HDL 31 (L) 09/12/2023   Lab Results  Component Value Date   LDLCALC 45 09/12/2023   Lab Results  Component Value Date   TRIG 129 09/12/2023   Lab Results  Component Value Date   CHOLHDL 3.3 09/12/2023   Lab Results  Component Value Date   HGBA1C 11.4 (H) 09/11/2023      Assessment & Plan:   Problem List Items Addressed This Visit       Cardiovascular and Mediastinum   Hypertension   BP Readings from Last 1 Encounters:  12/10/23 126/63   Well-controlled  with Amlodipine 10 mg QD and olmesartan 5 mg QD Counseled for compliance with the medications Advised DASH diet and moderate exercise/walking as tolerated      Relevant Medications   furosemide (LASIX) 20 MG tablet   Aortic atherosclerosis (HCC)   Noted on previous CT chest On aspirin and statin      Relevant Medications   furosemide (LASIX) 20 MG tablet   Chronic diastolic CHF (congestive heart failure) (HCC)   Has Lasix as needed for leg swelling On ARB and CCB Currently has leg swelling, needs to take Lasix as needed      Relevant Medications   furosemide (LASIX) 20 MG tablet     Endocrine   Type 2 diabetes mellitus with hyperglycemia (HCC) - Primary   Lab Results  Component Value Date   HGBA1C 11.4 (H) 09/11/2023   Uncontrolled due to diet noncompliance On Glimepiride 4 mg twice daily Recently started Lantus 10 units nightly Still has hyperglycemia -restart Ozempic at 0.5 mg qw, had severe nausea with 2 mg dose, and had to hold Ozempic Was on metformin, which was discontinued recently due to AKI Advised to follow diabetic diet, diet material provided Advised to check blood glucose regularly and bring the log in the next visit-contact if blood glucose more than 300 or less than 70 On statin  F/u CMP and HbA1C Diabetic eye exam: Advised to follow up with Ophthalmology for diabetic eye exam  On gabapentin 300 mg 3 times daily for diabetic neuropathy      Relevant Medications   insulin glargine (LANTUS SOLOSTAR) 100 UNIT/ML Solostar Pen   Semaglutide,0.25 or 0.5MG /DOS, (OZEMPIC, 0.25 OR 0.5 MG/DOSE,) 2 MG/3ML SOPN   Other Relevant Orders   CMP14+EGFR   Hemoglobin A1c   Secondary hyperparathyroidism of renal origin (HCC)   Followed by nephrology On vitamin D supplement      Relevant Orders   Parathyroid hormone, intact (no Ca)     Genitourinary   Stage 4 chronic kidney disease (HCC)   Last BMP reviewed -  recheck CMP, had AKI on CKD, likely due to dehydration  from recent nausea/vomiting - had advised to get repeat CMP in a week, but did not follow up Followed by Nephrology Avoid nephrotoxic agents Advised to maintain adequate hydration      Relevant Orders   CMP14+EGFR   CBC with Differential/Platelet   Parathyroid hormone, intact (no Ca)   Protein / creatinine ratio, urine   Other Visit Diagnoses       Type 2 diabetes mellitus with diabetic polyneuropathy, without long-term current use of insulin (HCC)       Relevant Medications   insulin glargine (LANTUS SOLOSTAR) 100 UNIT/ML Solostar Pen   Semaglutide,0.25 or 0.5MG /DOS, (OZEMPIC, 0.25 OR 0.5 MG/DOSE,) 2 MG/3ML  SOPN       Meds ordered this encounter  Medications   insulin glargine (LANTUS SOLOSTAR) 100 UNIT/ML Solostar Pen    Sig: Inject 10 Units into the skin daily.    Dispense:  3 mL    Refill:  3   Semaglutide,0.25 or 0.5MG /DOS, (OZEMPIC, 0.25 OR 0.5 MG/DOSE,) 2 MG/3ML SOPN    Sig: Inject 0.5 mg into the skin every 7 (seven) days.    Dispense:  3 mL    Refill:  1   furosemide (LASIX) 20 MG tablet    Sig: Take 1 tablet (20 mg total) by mouth daily as needed for fluid or edema (shortness  of breath).    Dispense:  30 tablet    Refill:  1    Follow-up: Return in about 3 months (around 03/11/2024) for DM.    Anabel Halon, MD

## 2023-12-10 NOTE — Assessment & Plan Note (Addendum)
 Has Lasix as needed for leg swelling On ARB and CCB Currently has leg swelling, needs to take Lasix as needed

## 2023-12-10 NOTE — Assessment & Plan Note (Signed)
 BP Readings from Last 1 Encounters:  12/10/23 126/63   Well-controlled with Amlodipine 10 mg QD and olmesartan 5 mg QD Counseled for compliance with the medications Advised DASH diet and moderate exercise/walking as tolerated

## 2023-12-10 NOTE — Assessment & Plan Note (Addendum)
 Lab Results  Component Value Date   HGBA1C 11.4 (H) 09/11/2023   Uncontrolled due to diet noncompliance On Glimepiride 4 mg twice daily Recently started Lantus 10 units nightly Still has hyperglycemia -restart Ozempic at 0.5 mg qw, had severe nausea with 2 mg dose, and had to hold Ozempic Was on metformin, which was discontinued recently due to AKI Advised to follow diabetic diet, diet material provided Advised to check blood glucose regularly and bring the log in the next visit-contact if blood glucose more than 300 or less than 70 On statin  F/u CMP and HbA1C Diabetic eye exam: Advised to follow up with Ophthalmology for diabetic eye exam  On gabapentin 300 mg 3 times daily for diabetic neuropathy

## 2023-12-10 NOTE — Assessment & Plan Note (Signed)
Noted on previous CT chest On aspirin and statin

## 2023-12-10 NOTE — Patient Instructions (Signed)
 Please start taking Ozempic 0.5 mg once weekly. Please continue taking Lantus 10 U at bedtime and Glimepiride as prescribed.  Please take Furosemide as needed for leg swelling. Please perform leg elevation and use compression socks as tolerated for leg swelling.  Please continue to take other medications as prescribed.  Please continue to follow low carb diet and ambulate as tolerated.

## 2023-12-10 NOTE — Assessment & Plan Note (Signed)
 Last BMP reviewed -  recheck CMP, had AKI on CKD, likely due to dehydration from recent nausea/vomiting - had advised to get repeat CMP in a week, but did not follow up Followed by Nephrology Avoid nephrotoxic agents Advised to maintain adequate hydration

## 2023-12-10 NOTE — Assessment & Plan Note (Signed)
 Followed by nephrology On vitamin D supplement

## 2023-12-13 DIAGNOSIS — R809 Proteinuria, unspecified: Secondary | ICD-10-CM | POA: Diagnosis not present

## 2023-12-13 DIAGNOSIS — D631 Anemia in chronic kidney disease: Secondary | ICD-10-CM | POA: Diagnosis not present

## 2023-12-13 DIAGNOSIS — N189 Chronic kidney disease, unspecified: Secondary | ICD-10-CM | POA: Diagnosis not present

## 2023-12-13 LAB — CBC WITH DIFFERENTIAL/PLATELET
Basophils Absolute: 0.1 10*3/uL (ref 0.0–0.2)
Basos: 2 %
EOS (ABSOLUTE): 0.5 10*3/uL — ABNORMAL HIGH (ref 0.0–0.4)
Eos: 6 %
Hematocrit: 36.1 % — ABNORMAL LOW (ref 37.5–51.0)
Hemoglobin: 11.9 g/dL — ABNORMAL LOW (ref 13.0–17.7)
Immature Grans (Abs): 0 10*3/uL (ref 0.0–0.1)
Immature Granulocytes: 0 %
Lymphocytes Absolute: 3.3 10*3/uL — ABNORMAL HIGH (ref 0.7–3.1)
Lymphs: 39 %
MCH: 31.8 pg (ref 26.6–33.0)
MCHC: 33 g/dL (ref 31.5–35.7)
MCV: 97 fL (ref 79–97)
Monocytes Absolute: 0.7 10*3/uL (ref 0.1–0.9)
Monocytes: 8 %
Neutrophils Absolute: 3.9 10*3/uL (ref 1.4–7.0)
Neutrophils: 45 %
Platelets: 224 10*3/uL (ref 150–450)
RBC: 3.74 x10E6/uL — ABNORMAL LOW (ref 4.14–5.80)
RDW: 11.6 % (ref 11.6–15.4)
WBC: 8.6 10*3/uL (ref 3.4–10.8)

## 2023-12-13 LAB — SPECIMEN STATUS REPORT

## 2023-12-13 LAB — CMP14+EGFR
ALT: 12 IU/L (ref 0–44)
AST: 13 IU/L (ref 0–40)
Albumin: 4.3 g/dL (ref 3.8–4.8)
Alkaline Phosphatase: 101 IU/L (ref 44–121)
BUN/Creatinine Ratio: 14 (ref 10–24)
BUN: 27 mg/dL (ref 8–27)
Bilirubin Total: 0.4 mg/dL (ref 0.0–1.2)
CO2: 23 mmol/L (ref 20–29)
Calcium: 9.8 mg/dL (ref 8.6–10.2)
Chloride: 105 mmol/L (ref 96–106)
Creatinine, Ser: 1.93 mg/dL — ABNORMAL HIGH (ref 0.76–1.27)
Globulin, Total: 2.7 g/dL (ref 1.5–4.5)
Glucose: 155 mg/dL — ABNORMAL HIGH (ref 70–99)
Potassium: 4.8 mmol/L (ref 3.5–5.2)
Sodium: 143 mmol/L (ref 134–144)
Total Protein: 7 g/dL (ref 6.0–8.5)
eGFR: 36 mL/min/{1.73_m2} — ABNORMAL LOW (ref >59)

## 2023-12-13 LAB — PROTEIN / CREATININE RATIO, URINE

## 2023-12-13 LAB — PARATHYROID HORMONE, INTACT (NO CA): PTH: 23 pg/mL (ref 15–65)

## 2023-12-13 LAB — HEMOGLOBIN A1C
Est. average glucose Bld gHb Est-mCnc: 160 mg/dL
Hgb A1c MFr Bld: 7.2 % — ABNORMAL HIGH (ref 4.8–5.6)

## 2023-12-21 DIAGNOSIS — I5032 Chronic diastolic (congestive) heart failure: Secondary | ICD-10-CM | POA: Diagnosis not present

## 2023-12-21 DIAGNOSIS — I129 Hypertensive chronic kidney disease with stage 1 through stage 4 chronic kidney disease, or unspecified chronic kidney disease: Secondary | ICD-10-CM | POA: Diagnosis not present

## 2023-12-21 DIAGNOSIS — N2581 Secondary hyperparathyroidism of renal origin: Secondary | ICD-10-CM | POA: Diagnosis not present

## 2023-12-21 DIAGNOSIS — E1122 Type 2 diabetes mellitus with diabetic chronic kidney disease: Secondary | ICD-10-CM | POA: Diagnosis not present

## 2023-12-21 DIAGNOSIS — N1832 Chronic kidney disease, stage 3b: Secondary | ICD-10-CM | POA: Diagnosis not present

## 2024-01-11 ENCOUNTER — Other Ambulatory Visit: Payer: Self-pay | Admitting: Internal Medicine

## 2024-01-11 DIAGNOSIS — G63 Polyneuropathy in diseases classified elsewhere: Secondary | ICD-10-CM

## 2024-01-24 ENCOUNTER — Other Ambulatory Visit: Payer: Self-pay | Admitting: Internal Medicine

## 2024-01-24 DIAGNOSIS — E1142 Type 2 diabetes mellitus with diabetic polyneuropathy: Secondary | ICD-10-CM

## 2024-01-24 NOTE — Telephone Encounter (Signed)
 Copied from CRM [id number]. Topic: Clinical - Medication Refill >> Jan 24, 2024 11:56 AM Juluis Ok wrote: Most Recent Primary Care Visit:  Provider: Meldon Sport  Department: [facility]  Visit Type: OFFICE VISIT  Date: 12/10/2023  Medication: glimepiride  (AMARYL ) 4 MG tablet  Has the patient contacted their pharmacy? Yes (Agent: If no, request that the patient contact the pharmacy for the refill. If patient does not wish to contact the pharmacy document the reason why and proceed with request.) (Agent: If yes, when and what did the pharmacy advise?)  Is this the correct pharmacy for this prescription? Yes If no, delete pharmacy and type the correct one.  This is the patient's preferred pharmacy:  [facility] - [place], [place] - [address] [place] [address] Phone: [phone] Fax: [phone]     Has the prescription been filled recently? No  Is the patient out of the medication? Yes  Has the patient been seen for an appointment in the last year OR does the patient have an upcoming appointment? Yes  Can we respond through MyChart? No  Agent: Please be advised that Rx refills may take up to 3 business days. We ask that you follow-up with your pharmacy.

## 2024-01-24 NOTE — Telephone Encounter (Signed)
 Patient requested refill of Amaryl  4 mg. Chart showing 1 refill left available at pharmacy. This RN contacted pharmacy to see if available, patient had stated it was not. Pharmacy will refill now. Patient has been informed. No further needs.

## 2024-02-11 ENCOUNTER — Other Ambulatory Visit: Payer: Self-pay | Admitting: Internal Medicine

## 2024-02-11 DIAGNOSIS — I5032 Chronic diastolic (congestive) heart failure: Secondary | ICD-10-CM

## 2024-02-28 ENCOUNTER — Telehealth: Payer: Self-pay | Admitting: Internal Medicine

## 2024-02-28 NOTE — Telephone Encounter (Signed)
 Prescription Request  02/28/2024  LOV: 12/10/2023  What is the name of the medication or equipment? glimepiride  (AMARYL ) 4 MG tablet [[id number]]    Have you contacted your pharmacy to request a refill? Yes   Which pharmacy would you like this sent to?   [facility] - [place], [place] - [address] [place] [address] Phone: [phone] Fax: [phone]   Patient notified that their request is being sent to the clinical staff for review and that they should receive a response within 2 business days.   Please advise at [facility] [phone]

## 2024-02-28 NOTE — Telephone Encounter (Signed)
 Copied from CRM [id number]. Topic: Clinical - Medication Question >> Feb 28, 2024  8:23 AM Marissa P wrote: Reason for CRM: Patient called wanting nurse or someone to please contact him back asap please in regards to getting his meds, he did contact pharmacy which told him that they have not gotten anything over, pharmacy did send something over. Whether the refill can be done or not patient would like someone to contact him back to inform him on what's going on please Med: glimepiride  (AMARYL ) 4 MG tablet [phone] that number was verified by patient correct contact # on file as he does NOT use mychart. Thank you

## 2024-02-29 ENCOUNTER — Other Ambulatory Visit: Payer: Self-pay | Admitting: Internal Medicine

## 2024-02-29 DIAGNOSIS — E1142 Type 2 diabetes mellitus with diabetic polyneuropathy: Secondary | ICD-10-CM

## 2024-02-29 MED ORDER — GLIMEPIRIDE 4 MG PO TABS: 4.0000 mg | ORAL_TABLET | Freq: Two times a day (BID) | ORAL | 3 refills | Status: DC

## 2024-02-29 NOTE — Telephone Encounter (Signed)
 Last Fill: 09/13/23  Last OV: 12/10/23 Next OV: 03/11/24  Routing to provider for review/authorization.

## 2024-02-29 NOTE — Telephone Encounter (Signed)
 Copied from CRM [id number]. Topic: Clinical - Medication Refill >> Feb 29, 2024  8:41 AM Alpha Arts wrote: Medication: glimepiride  (AMARYL ) 4 MG tablet   Has the patient contacted their pharmacy? Yes (Agent: If no, request that the patient contact the pharmacy for the refill. If patient does not wish to contact the pharmacy document the reason why and proceed with request.) (Agent: If yes, when and what did the pharmacy advise?)  This is the patient's preferred pharmacy:  [facility] - [place], [place] - [address] [place] [address] Phone: [phone] Fax: [phone]  [facility] - [place], [place] - [address] [place] [address] Phone: [phone] Fax: [phone]  Is this the correct pharmacy for this prescription? Yes If no, delete pharmacy and type the correct one.   Has the prescription been filled recently? Yes  Is the patient out of the medication? Yes  Has the patient been seen for an appointment in the last year OR does the patient have an upcoming appointment? Yes  Can we respond through MyChart? No  Agent: Please be advised that Rx refills may take up to 3 business days. We ask that you follow-up with your pharmacy.

## 2024-03-05 DIAGNOSIS — N189 Chronic kidney disease, unspecified: Secondary | ICD-10-CM | POA: Diagnosis not present

## 2024-03-05 DIAGNOSIS — E211 Secondary hyperparathyroidism, not elsewhere classified: Secondary | ICD-10-CM | POA: Diagnosis not present

## 2024-03-05 DIAGNOSIS — D631 Anemia in chronic kidney disease: Secondary | ICD-10-CM | POA: Diagnosis not present

## 2024-03-05 DIAGNOSIS — R809 Proteinuria, unspecified: Secondary | ICD-10-CM | POA: Diagnosis not present

## 2024-03-11 ENCOUNTER — Ambulatory Visit (INDEPENDENT_AMBULATORY_CARE_PROVIDER_SITE_OTHER): Admitting: Internal Medicine

## 2024-03-11 ENCOUNTER — Encounter: Payer: Self-pay | Admitting: Internal Medicine

## 2024-03-11 VITALS — BP 112/60 | HR 91 | Ht 67.0 in | Wt 196.6 lb

## 2024-03-11 DIAGNOSIS — I10 Essential (primary) hypertension: Secondary | ICD-10-CM | POA: Diagnosis not present

## 2024-03-11 DIAGNOSIS — N184 Chronic kidney disease, stage 4 (severe): Secondary | ICD-10-CM | POA: Diagnosis not present

## 2024-03-11 DIAGNOSIS — E1165 Type 2 diabetes mellitus with hyperglycemia: Secondary | ICD-10-CM

## 2024-03-11 DIAGNOSIS — I5032 Chronic diastolic (congestive) heart failure: Secondary | ICD-10-CM

## 2024-03-11 DIAGNOSIS — Z794 Long term (current) use of insulin: Secondary | ICD-10-CM | POA: Diagnosis not present

## 2024-03-11 MED ORDER — FREESTYLE LIBRE 3 PLUS SENSOR MISC: 3 refills | Status: AC

## 2024-03-11 MED ORDER — FREESTYLE LIBRE 3 READER DEVI: 0 refills | Status: AC

## 2024-03-11 NOTE — Assessment & Plan Note (Addendum)
 Lab Results  Component Value Date   HGBA1C 7.2 (H) 12/10/2023   Well-controlled now On Glimepiride  4 mg twice daily and Lantus  10 units nightly On Ozempic  at 0.5 mg qw now, had severe nausea with 2 mg dose Was on metformin , which was discontinued due to AKI on CKD Advised to follow diabetic diet, diet material provided  Considering his variable glycemic profile, will benefit from CGM-prescribed freestyle libre 3+ Advised to check blood glucose regularly and bring the log in the next visit-contact if blood glucose more than 300 or less than 70 On statin  F/u CMP and HbA1C Diabetic eye exam: Advised to follow up with Ophthalmology for diabetic eye exam  On gabapentin  300 mg 3 times daily for diabetic neuropathy

## 2024-03-11 NOTE — Assessment & Plan Note (Signed)
 Has Lasix as needed for leg swelling On ARB and CCB Currently has leg swelling, needs to take Lasix as needed

## 2024-03-11 NOTE — Patient Instructions (Signed)
 Please continue to take medications as prescribed.  Please continue to follow low carb diet and perform moderate exercise/walking as tolerated.

## 2024-03-11 NOTE — Progress Notes (Signed)
 Established Patient Office Visit  Subjective:  Patient ID: Collin Yoder, male    DOB: 10-01-1951  Age: 73 y.o. MRN: [id number]  CC:  Chief Complaint  Patient presents with   Medical Management of Chronic Issues    3 month f/u    HPI CRAIGE Naya Ilagan is a 73 y.o. male with past medical history of CVA, HTN, COPD, type II DM with neuropathy and tobacco abuse who presents for f/u of his chronic medical conditions.  HTN: BP is well-controlled. Takes medications regularly. Patient denies headache, dizziness, chest pain, dyspnea or palpitations.  Diastolic CHF: He has b/l leg swelling, which is chronic.  He takes Lasix  as needed for it.  He still takes olmesartan  for his DM.  Denies orthopnea or PND currently.   Type 2 DM: His last HbA1C was 7.2 in 03/25, improved from 11.4 in 12/24. He is on glimepiride  4 mg BID and Lantus  10 units nightly currently.  He has tolerated Ozempic  0.5 mg QW since the last visit.  He admits that he has been trying to follow low-carb diet recently.  His blood close has been around 120-200 mostly. He also takes Gabapentin  for DM neuropathy. He states that he had balance problem due to neuropathy, but has been better since increasing dose of Gabapentin .  CKD: He sees Nephrologist. His last BMP showed GFR of 36. Denies any dysuria, hematuria or urinary hesitancy/resistance.  Past Medical History:  Diagnosis Date   Borderline diabetes    Peripheral neuropathy 07/15/2019   Right lower lobe lung mass 04/11/2017   Thalamic stroke (HCC) 07/15/2019   Type II diabetes mellitus, uncontrolled 07/15/2019   Vitamin D  deficiency disease 07/15/2019    Past Surgical History:  Procedure Laterality Date   APPENDECTOMY     CATARACT EXTRACTION, BILATERAL Bilateral 01/2021    Family History  Problem Relation Age of Onset   Diabetes Mother 30   Stroke Mother    Colon cancer Neg Hx     Social History   Socioeconomic History   Marital status: Single    Spouse  name: Not on file   Number of children: Not on file   Years of education: Not on file   Highest education level: Not on file  Occupational History   Not on file  Tobacco Use   Smoking status: Every Day    Current packs/day: 1.00    Average packs/day: 1 pack/day for 45.0 years (45.0 ttl pk-yrs)    Types: Cigarettes   Smokeless tobacco: Never   Tobacco comments:    Down to 1/2 ppd  Vaping Use   Vaping status: Never Used  Substance and Sexual Activity   Alcohol use: Yes    Alcohol/week: 1.0 standard drink of alcohol    Types: 1 Cans of beer per week    Comment: every 3 days   Drug use: No   Sexual activity: Not on file  Other Topics Concern   Not on file  Social History Narrative   Single.Lives alone in a motel.Works at [employer].   Social Drivers of Corporate investment banker Strain: Low Risk  (03/15/2022)   Overall Financial Resource Strain (CARDIA)    Difficulty of Paying Living Expenses: Not hard at all  Food Insecurity: No Food Insecurity (09/14/2023)   Hunger Vital Sign    Worried About Running Out of Food in the Last Year: Never true    Ran Out of Food in the Last Year: Never true  Transportation Needs:  No Transportation Needs (09/14/2023)   PRAPARE - Administrator, Civil Service (Medical): No    Lack of Transportation (Non-Medical): No  Physical Activity: Sufficiently Active (03/15/2022)   Exercise Vital Sign    Days of Exercise per Week: 7 days    Minutes of Exercise per Session: 50 min  Stress: No Stress Concern Present (03/15/2022)   [employer] of Occupational Health - Occupational Stress Questionnaire    Feeling of Stress : Not at all  Social Connections: Socially Isolated (03/15/2022)   Social Connection and Isolation Panel [NHANES]    Frequency of Communication with Friends and Family: More than three times a week    Frequency of Social Gatherings with Friends and Family: More than three times a week    Attends Religious Services:  Never    Database administrator or Organizations: No    Attends Banker Meetings: Never    Marital Status: Divorced  Catering manager Violence: Not At Risk (09/14/2023)   Humiliation, Afraid, Rape, and Kick questionnaire    Fear of Current or Ex-Partner: No    Emotionally Abused: No    Physically Abused: No    Sexually Abused: No    Outpatient Medications Prior to Visit  Medication Sig Dispense Refill   Alcohol Swabs (B-D SINGLE USE SWABS REGULAR) PADS 1 Units by Does not apply route daily at 12 noon. 100 each 1   amLODipine  (NORVASC ) 10 MG tablet Take 1 tablet (10 mg total) by mouth daily. 90 tablet 3   aspirin  81 MG chewable tablet Chew 1 tablet (81 mg total) by mouth daily. 30 tablet 11   atorvastatin  (LIPITOR) 40 MG tablet Take 1 tablet (40 mg total) by mouth daily. 90 tablet 3   Blood Glucose Monitoring Suppl (TRUE METRIX METER) w/Device KIT 1 kit by Does not apply route See admin instructions. 1 kit 0   Cholecalciferol (VITAMIN D3) 5000 units CAPS Take 1 capsule by mouth daily.     furosemide  (LASIX ) 20 MG tablet Take 1 tablet (20 mg total) by mouth daily as needed for fluid or edema (shortness of breath). 30 tablet 1   gabapentin  (NEURONTIN ) 300 MG capsule Take 1 capsule (300 mg total) by mouth 3 (three) times daily. 270 capsule 1   glimepiride  (AMARYL ) 4 MG tablet Take 1 tablet (4 mg total) by mouth 2 (two) times daily before a meal. 60 tablet 3   glucose blood (TRUE METRIX BLOOD GLUCOSE TEST) test strip Use as instructed 100 each 12   insulin  glargine (LANTUS  SOLOSTAR) 100 UNIT/ML Solostar Pen Inject 10 Units into the skin daily. 3 mL 3   Insulin  Pen Needle (PEN NEEDLES) 32G X 4 MM MISC Use as directed for injecting insulin  100 each 2   metoCLOPramide  (REGLAN ) 5 MG tablet Take 1 tablet (5 mg total) by mouth every 8 (eight) hours as needed for nausea. 30 tablet 1   olmesartan  (BENICAR ) 5 MG tablet Take 1 tablet (5 mg total) by mouth daily. 30 tablet 11    Semaglutide ,0.25 or 0.5MG /DOS, (OZEMPIC , 0.25 OR 0.5 MG/DOSE,) 2 MG/3ML SOPN Inject 0.5 mg into the skin every 7 (seven) days. 3 mL 1   TRUEplus Lancets 33G MISC 1 each by Does not apply route daily. 100 each 10   No facility-administered medications prior to visit.    No Known Allergies  ROS Review of Systems  Constitutional:  Negative for chills and fever.  HENT:  Negative for congestion and sore throat.  Eyes:  Negative for pain and discharge.  Respiratory:  Negative for cough and shortness of breath.   Cardiovascular:  Negative for chest pain and palpitations.  Gastrointestinal:  Negative for diarrhea, nausea and vomiting.  Endocrine: Negative for polydipsia and polyuria.  Genitourinary:  Negative for dysuria and hematuria.  Musculoskeletal:  Negative for neck pain and neck stiffness.  Skin:  Negative for rash.       Neck mass  Neurological:  Positive for tremors, weakness and numbness. Negative for dizziness and headaches.  Psychiatric/Behavioral:  Negative for agitation and behavioral problems.       Objective:    Physical Exam Vitals reviewed.  Constitutional:      General: He is not in acute distress.    Appearance: He is not diaphoretic.  HENT:     Head: Normocephalic and atraumatic.     Nose: Nose normal.     Mouth/Throat:     Mouth: Mucous membranes are moist.  Eyes:     General: No scleral icterus.    Extraocular Movements: Extraocular movements intact.  Neck:     Comments: Posterior neck mass - oval shaped, about 1 cm X 2 cm, nontender Cardiovascular:     Rate and Rhythm: Normal rate and regular rhythm.     Heart sounds: Normal heart sounds. No murmur heard. Pulmonary:     Breath sounds: Normal breath sounds. No wheezing or rales.  Musculoskeletal:     Cervical back: Neck supple. No tenderness.     Right lower leg: No edema.     Left lower leg: No edema.  Skin:    General: Skin is warm.     Findings: No rash.  Neurological:     General: No focal  deficit present.     Mental Status: He is alert and oriented to person, place, and time.     Sensory: Sensory deficit (B/l feet) present.     Motor: Weakness (B/l LE - 4/5) present.     Comments: Resting tremors of right hand  Psychiatric:        Mood and Affect: Mood normal.        Behavior: Behavior normal.     BP 112/60   Pulse 91   Ht 5\' 7"  (1.702 m)   Wt 196 lb 9.6 oz (89.2 kg)   SpO2 92%   BMI 30.79 kg/m  Wt Readings from Last 3 Encounters:  03/11/24 196 lb 9.6 oz (89.2 kg)  12/10/23 192 lb 12.8 oz (87.5 kg)  09/13/23 175 lb 4.3 oz (79.5 kg)    Lab Results  Component Value Date   TSH 3.937 09/11/2023   Lab Results  Component Value Date   WBC 8.6 12/10/2023   HGB 11.9 (L) 12/10/2023   HCT 36.1 (L) 12/10/2023   MCV 97 12/10/2023   PLT 224 12/10/2023   Lab Results  Component Value Date   NA 143 12/10/2023   K 4.8 12/10/2023   CO2 23 12/10/2023   GLUCOSE 155 (H) 12/10/2023   BUN 27 12/10/2023   CREATININE 1.93 (H) 12/10/2023   BILITOT 0.4 12/10/2023   ALKPHOS 101 12/10/2023   AST 13 12/10/2023   ALT 12 12/10/2023   PROT 7.0 12/10/2023   ALBUMIN  4.3 12/10/2023   CALCIUM  9.8 12/10/2023   ANIONGAP 12 09/13/2023   EGFR 36 (L) 12/10/2023   Lab Results  Component Value Date   CHOL 102 09/12/2023   Lab Results  Component Value Date   HDL 31 (L) 09/12/2023  Lab Results  Component Value Date   LDLCALC 45 09/12/2023   Lab Results  Component Value Date   TRIG 129 09/12/2023   Lab Results  Component Value Date   CHOLHDL 3.3 09/12/2023   Lab Results  Component Value Date   HGBA1C 7.2 (H) 12/10/2023      Assessment & Plan:   Problem List Items Addressed This Visit       Cardiovascular and Mediastinum   Hypertension   BP Readings from Last 1 Encounters:  03/11/24 112/60   Well-controlled with Amlodipine  10 mg QD and olmesartan  5 mg QD Counseled for compliance with the medications Advised DASH diet and moderate exercise/walking as  tolerated      Chronic diastolic CHF (congestive heart failure) (HCC)   Has Lasix  as needed for leg swelling On ARB and CCB Currently has leg swelling, needs to take Lasix  as needed        Endocrine   Type 2 diabetes mellitus with hyperglycemia (HCC) - Primary   Lab Results  Component Value Date   HGBA1C 7.2 (H) 12/10/2023   Well-controlled now On Glimepiride  4 mg twice daily and Lantus  10 units nightly On Ozempic  at 0.5 mg qw now, had severe nausea with 2 mg dose Was on metformin , which was discontinued due to AKI on CKD Advised to follow diabetic diet, diet material provided  Considering his variable glycemic profile, will benefit from CGM-prescribed freestyle libre 3+ Advised to check blood glucose regularly and bring the log in the next visit-contact if blood glucose more than 300 or less than 70 On statin  F/u CMP and HbA1C Diabetic eye exam: Advised to follow up with Ophthalmology for diabetic eye exam  On gabapentin  300 mg 3 times daily for diabetic neuropathy      Relevant Medications   Continuous Glucose Sensor (FREESTYLE LIBRE 3 PLUS SENSOR) MISC   Continuous Glucose Receiver (FREESTYLE LIBRE 3 READER) DEVI   Other Relevant Orders   Microalbumin / creatinine urine ratio     Genitourinary   Stage 4 chronic kidney disease (HCC)   Last BMP reviewed, had blood tests today at [facility] for Nephrology visit, result not available for review yet Followed by Nephrology On Olmesartan  Avoid nephrotoxic agents Advised to maintain adequate hydration       Meds ordered this encounter  Medications   Continuous Glucose Sensor (FREESTYLE LIBRE 3 PLUS SENSOR) MISC    Sig: Change sensor every 15 days.    Dispense:  6 each    Refill:  3   Continuous Glucose Receiver (FREESTYLE LIBRE 3 READER) DEVI    Sig: Use it to check blood glucose as instructed.    Dispense:  1 each    Refill:  0    Follow-up: Return in about 3 months (around 06/11/2024) for Annual physical.     Meldon Sport, MD

## 2024-03-11 NOTE — Assessment & Plan Note (Signed)
 BP Readings from Last 1 Encounters:  03/11/24 112/60   Well-controlled with Amlodipine  10 mg QD and olmesartan  5 mg QD Counseled for compliance with the medications Advised DASH diet and moderate exercise/walking as tolerated

## 2024-03-11 NOTE — Assessment & Plan Note (Addendum)
 Last BMP reviewed, had blood tests today at [facility] for Nephrology visit, result not available for review yet Followed by Nephrology On Olmesartan  Avoid nephrotoxic agents Advised to maintain adequate hydration

## 2024-03-12 DIAGNOSIS — N184 Chronic kidney disease, stage 4 (severe): Secondary | ICD-10-CM | POA: Diagnosis not present

## 2024-03-12 DIAGNOSIS — N179 Acute kidney failure, unspecified: Secondary | ICD-10-CM | POA: Diagnosis not present

## 2024-03-12 DIAGNOSIS — E1122 Type 2 diabetes mellitus with diabetic chronic kidney disease: Secondary | ICD-10-CM | POA: Diagnosis not present

## 2024-03-12 DIAGNOSIS — E1129 Type 2 diabetes mellitus with other diabetic kidney complication: Secondary | ICD-10-CM | POA: Diagnosis not present

## 2024-03-31 ENCOUNTER — Telehealth: Payer: Self-pay

## 2024-03-31 NOTE — Telephone Encounter (Signed)
 Copied from CRM [id number]. Topic: General - Other >> Mar 31, 2024  9:31 AM Sasha H wrote: Reason for CRM: Pt has AWV today that he wants to cancel due to being sick, but I can't cancel it

## 2024-05-19 ENCOUNTER — Other Ambulatory Visit: Payer: Self-pay | Admitting: Internal Medicine

## 2024-05-19 DIAGNOSIS — E1165 Type 2 diabetes mellitus with hyperglycemia: Secondary | ICD-10-CM

## 2024-06-11 ENCOUNTER — Encounter: Admitting: Internal Medicine

## 2024-06-17 ENCOUNTER — Other Ambulatory Visit: Payer: Self-pay | Admitting: Internal Medicine

## 2024-06-17 DIAGNOSIS — E1142 Type 2 diabetes mellitus with diabetic polyneuropathy: Secondary | ICD-10-CM

## 2024-06-26 ENCOUNTER — Ambulatory Visit: Admitting: Internal Medicine

## 2024-06-30 ENCOUNTER — Other Ambulatory Visit: Payer: Self-pay | Admitting: Internal Medicine

## 2024-06-30 DIAGNOSIS — G63 Polyneuropathy in diseases classified elsewhere: Secondary | ICD-10-CM

## 2024-07-11 ENCOUNTER — Other Ambulatory Visit: Payer: Self-pay | Admitting: Internal Medicine

## 2024-07-11 DIAGNOSIS — E1142 Type 2 diabetes mellitus with diabetic polyneuropathy: Secondary | ICD-10-CM

## 2024-09-09 ENCOUNTER — Ambulatory Visit

## 2024-09-09 VITALS — Ht 67.25 in | Wt 192.0 lb

## 2024-09-09 DIAGNOSIS — Z0001 Encounter for general adult medical examination with abnormal findings: Secondary | ICD-10-CM | POA: Diagnosis not present

## 2024-09-09 DIAGNOSIS — Z0000 Encounter for general adult medical examination without abnormal findings: Secondary | ICD-10-CM

## 2024-09-09 DIAGNOSIS — F17210 Nicotine dependence, cigarettes, uncomplicated: Secondary | ICD-10-CM | POA: Diagnosis not present

## 2024-09-09 DIAGNOSIS — F17200 Nicotine dependence, unspecified, uncomplicated: Secondary | ICD-10-CM

## 2024-09-09 NOTE — Patient Instructions (Addendum)
 Mr. Collin Yoder,  Thank you for taking the time for your Medicare Wellness Visit. I appreciate your continued commitment to your health goals. Please review the care plan we discussed, and feel free to reach out if I can assist you further.  Please note that Annual Wellness Visits do not include a physical exam. Some assessments may be limited, especially if the visit was conducted virtually. If needed, we may recommend an in-person follow-up with your provider.  Ongoing Care Seeing your primary care provider every 3 to 6 months helps us  monitor your health and provide consistent, personalized care.   Next office visit with primary care provider: October 22, 2024 at 9:20 am   1 year follow up for Medicare well visit: September 11, 2025 at 10:00 am with medicare wellness nurse in office  Referrals If a referral was made during today's visit and you haven't received any updates within two weeks, please contact the referred provider directly to check on the status.  Lung Cancer Screening-[place] Office [address]-First Floor Medical Building directly across from [facility] Phone [phone]   Eye Doctor Information: [facility] Address: [address] [place], [place] [address] Phone: [phone]   Recommended Screenings:  Health Maintenance  Topic Date Due   Eye exam for diabetics  10/04/2022   Screening for Lung Cancer  11/22/2022   Yearly kidney health urinalysis for diabetes  01/12/2023   DTaP/Tdap/Td vaccine (2 - Tdap) 10/03/2023   Medicare Annual Wellness Visit  03/26/2024   Flu Shot  05/02/2024   Complete foot exam   05/21/2024   COVID-19 Vaccine (1 - 2025-26 season) Never done   Hemoglobin A1C  06/11/2024   Cologuard (Stool DNA test)  10/21/2024   Yearly kidney function blood test for diabetes  12/09/2024   Pneumococcal Vaccine for age over 83  Completed   Hepatitis C Screening  Completed   Zoster (Shingles) Vaccine  Completed   Meningitis B Vaccine   Aged Out       09/09/2024    3:08 PM  Advanced Directives  Does Patient Have a Medical Advance Directive? No  Would patient like information on creating a medical advance directive? No - Patient declined    Vision: Annual vision screenings are recommended for early detection of glaucoma, cataracts, and diabetic retinopathy. These exams can also reveal signs of chronic conditions such as diabetes and high blood pressure.  Dental: Annual dental screenings help detect early signs of oral cancer, gum disease, and other conditions linked to overall health, including heart disease and diabetes.  Please see the attached documents for additional preventive care recommendations.

## 2024-09-09 NOTE — Progress Notes (Signed)
 Lung cancer screening ordered. Patient scheduled for follow up on October 22, 2024.  Needs labs updated  Chief Complaint  Patient presents with   Medicare Wellness     Subjective:   Collin Yoder is a 73 y.o. male who presents for a Medicare Annual Wellness Visit.  Visit info / Clinical Intake: Medicare Wellness Visit Type:: Subsequent Annual Wellness Visit Persons participating in visit and providing information:: patient Medicare Wellness Visit Mode:: Telephone If telephone:: video declined Since this visit was completed virtually, some vitals may be partially provided or unavailable. Missing vitals are due to the limitations of the virtual format.: Documented vitals are patient reported If Telephone or Video please confirm:: I connected with patient using audio/video enable telemedicine. I verified patient identity with two identifiers, discussed telehealth limitations, and patient agreed to proceed. Patient Location:: home Provider Location:: home office Interpreter Needed?: No Pre-visit prep was completed: yes AWV questionnaire completed by patient prior to visit?: no Living arrangements:: (!) lives alone Patient's Overall Health Status Rating: (!) fair Typical amount of pain: none Does pain affect daily life?: no Are you currently prescribed opioids?: no  Dietary Habits and Nutritional Risks How many meals a day?: 3 Eats fruit and vegetables daily?: yes Most meals are obtained by: preparing own meals; having others provide food In the last 2 weeks, have you had any of the following?: none Diabetic:: (!) yes Any non-healing wounds?: no How often do you check your BS?: 3 Would you like to be referred to a Nutritionist or for Diabetic Management? : no  Functional Status Activities of Daily Living (to include ambulation/medication): Independent Ambulation: Independent with device- listed below Home Assistive Devices/Equipment: Cane Medication Administration:  Independent Home Management (perform basic housework or laundry): Independent Manage your own finances?: yes Primary transportation is: family / friends Concerns about vision?: no *vision screening is required for WTM* Concerns about hearing?: no  Fall Screening Falls in the past year?: 0 Number of falls in past year: 0 Was there an injury with Fall?: 0 Fall Risk Category Calculator: 0 Patient Fall Risk Level: Low Fall Risk  Fall Risk Patient at Risk for Falls Due to: Impaired balance/gait Fall risk Follow up: Falls evaluation completed; Education provided; Falls prevention discussed  Home and Transportation Safety: All rugs have non-skid backing?: N/A, no rugs All stairs or steps have railings?: yes Grab bars in the bathtub or shower?: yes Have non-skid surface in bathtub or shower?: yes Good home lighting?: yes Regular seat belt use?: yes Hospital stays in the last year:: no  Cognitive Assessment Difficulty concentrating, remembering, or making decisions? : no Will 6CIT or Mini Cog be Completed: no 6CIT or Mini Cog Declined: patient alert, oriented, able to answer questions appropriately and recall recent events  Advance Directives (For Healthcare) Does Patient Have a Medical Advance Directive?: No Would patient like information on creating a medical advance directive?: No - Patient declined  Reviewed/Updated  Reviewed/Updated: Reviewed All (Medical, Surgical, Family, Medications, Allergies, Care Teams, Patient Goals)    Allergies (verified) Patient has no known allergies.   Current Medications (verified) Outpatient Encounter Medications as of 09/09/2024  Medication Sig   Alcohol Swabs (B-D SINGLE USE SWABS REGULAR) PADS 1 Units by Does not apply route daily at 12 noon.   amLODipine  (NORVASC ) 10 MG tablet Take 1 tablet (10 mg total) by mouth daily.   aspirin  81 MG chewable tablet Chew 1 tablet (81 mg total) by mouth daily.   atorvastatin  (LIPITOR) 40 MG tablet Take  1 tablet (40 mg total) by mouth daily.   Blood Glucose Monitoring Suppl (TRUE METRIX METER) w/Device KIT 1 kit by Does not apply route See admin instructions.   Cholecalciferol (VITAMIN D3) 5000 units CAPS Take 1 capsule by mouth daily.   Continuous Glucose Receiver (FREESTYLE LIBRE 3 READER) DEVI Use it to check blood glucose as instructed.   Continuous Glucose Sensor (FREESTYLE LIBRE 3 PLUS SENSOR) MISC Change sensor every 15 days.   furosemide  (LASIX ) 20 MG tablet Take 1 tablet (20 mg total) by mouth daily as needed for fluid or edema (shortness of breath).   gabapentin  (NEURONTIN ) 300 MG capsule Take 1 capsule (300 mg total) by mouth 3 (three) times daily.   glimepiride  (AMARYL ) 4 MG tablet Take 1 tablet (4 mg total) by mouth 2 (two) times daily before a meal.   glucose blood (TRUE METRIX BLOOD GLUCOSE TEST) test strip Use as instructed   insulin  glargine (LANTUS  SOLOSTAR) 100 UNIT/ML Solostar Pen Inject 10 Units into the skin daily.   metoCLOPramide  (REGLAN ) 5 MG tablet Take 1 tablet (5 mg total) by mouth every 8 (eight) hours as needed for nausea.   olmesartan  (BENICAR ) 5 MG tablet Take 1 tablet (5 mg total) by mouth daily.   OZEMPIC , 0.25 OR 0.5 MG/DOSE, 2 MG/3ML SOPN Inject 0.5 mg into the skin every 7 (seven) days.   SURE COMFORT PEN NEEDLES 32G X 4 MM MISC Use as directed for injecting insulin    TRUEplus Lancets 33G MISC 1 each by Does not apply route daily.   No facility-administered encounter medications on file as of 09/09/2024.    History: Past Medical History:  Diagnosis Date   Borderline diabetes    Peripheral neuropathy 07/15/2019   Right lower lobe lung mass 04/11/2017   Thalamic stroke (HCC) 07/15/2019   Type II diabetes mellitus, uncontrolled 07/15/2019   Vitamin D  deficiency disease 07/15/2019   Past Surgical History:  Procedure Laterality Date   APPENDECTOMY     CATARACT EXTRACTION, BILATERAL Bilateral 01/2021   Family History  Problem Relation Age of Onset    Diabetes Mother 74   Stroke Mother    Colon cancer Neg Hx    Social History   Occupational History   Not on file  Tobacco Use   Smoking status: Every Day    Current packs/day: 1.00    Average packs/day: 1 pack/day for 58.9 years (58.9 ttl pk-yrs)    Types: Cigarettes    Start date: 1967   Smokeless tobacco: Never   Tobacco comments:    Down to 1/2 ppd  Vaping Use   Vaping status: Never Used  Substance and Sexual Activity   Alcohol use: Yes    Alcohol/week: 1.0 standard drink of alcohol    Types: 1 Cans of beer per week    Comment: every 3 days   Drug use: No   Sexual activity: Not on file   Tobacco Counseling Ready to quit: No Counseling given: Yes Tobacco comments: Down to 1/2 ppd  SDOH Screenings   Food Insecurity: No Food Insecurity (09/09/2024)  Housing: Low Risk  (09/09/2024)  Transportation Needs: No Transportation Needs (09/09/2024)  Utilities: Not At Risk (09/09/2024)  Alcohol Screen: Low Risk  (03/15/2022)  Depression (PHQ2-9): Low Risk  (09/09/2024)  Financial Resource Strain: Low Risk  (03/15/2022)  Physical Activity: Sufficiently Active (09/09/2024)  Social Connections: Moderately Isolated (09/09/2024)  Stress: No Stress Concern Present (09/09/2024)  Tobacco Use: High Risk (09/09/2024)  Health Literacy: Adequate Health Literacy (09/09/2024)  See flowsheets for full screening details  Depression Screen PHQ 2 & 9 Depression Scale- Over the past 2 weeks, how often have you been bothered by any of the following problems? Little interest or pleasure in doing things: 0 Feeling down, depressed, or hopeless (PHQ Adolescent also includes...irritable): 0 PHQ-2 Total Score: 0 Trouble falling or staying asleep, or sleeping too much: 0 Feeling tired or having little energy: 0 Poor appetite or overeating (PHQ Adolescent also includes...weight loss): 0 Feeling bad about yourself - or that you are a failure or have let yourself or your family down: 0 Trouble concentrating  on things, such as reading the newspaper or watching television (PHQ Adolescent also includes...like school work): 0 Moving or speaking so slowly that other people could have noticed. Or the opposite - being so fidgety or restless that you have been moving around a lot more than usual: 0 Thoughts that you would be better off dead, or of hurting yourself in some way: 0 PHQ-9 Total Score: 0 If you checked off any problems, how difficult have these problems made it for you to do your work, take care of things at home, or get along with other people?: Not difficult at all     Goals Addressed               This Visit's Progress     I want to improve my health (pt-stated)               Objective:    Today's Vitals   09/09/24 1504  Weight: 192 lb (87.1 kg)  Height: 5' 7.25 (1.708 m)   Body mass index is 29.85 kg/m.  Hearing/Vision screen Hearing Screening - Comments:: Patient denies any hearing difficulties.   Vision Screening - Comments:: Patient does not have an eye doctor. A list of eye doctors has been provided to the patient.   Immunizations and Health Maintenance Health Maintenance  Topic Date Due   OPHTHALMOLOGY EXAM  10/04/2022   Lung Cancer Screening  11/22/2022   Diabetic kidney evaluation - Urine ACR  01/12/2023   DTaP/Tdap/Td (2 - Tdap) 10/03/2023   Medicare Annual Wellness (AWV)  03/26/2024   Influenza Vaccine  05/02/2024   FOOT EXAM  05/21/2024   COVID-19 Vaccine (1 - 2025-26 season) Never done   HEMOGLOBIN A1C  06/11/2024   Fecal DNA (Cologuard)  10/21/2024   Diabetic kidney evaluation - eGFR measurement  12/09/2024   Pneumococcal Vaccine: 50+ Years  Completed   Hepatitis C Screening  Completed   Zoster Vaccines- Shingrix  Completed   Meningococcal B Vaccine  Aged Out        Assessment/Plan:  This is a routine wellness examination for Collin Yoder.  Patient Care Team: Tobie Suzzane POUR, MD as PCP - General (Internal Medicine) Shaaron Lamar HERO, MD as  Consulting Physician (Gastroenterology) Roddie Bring, DPM as Consulting Physician (Podiatry)  I have personally reviewed and noted the following in the patient's chart:   Medical and social history Use of alcohol, tobacco or illicit drugs  Current medications and supplements including opioid prescriptions. Functional ability and status Nutritional status Physical activity Advanced directives List of other physicians Hospitalizations, surgeries, and ER visits in previous 12 months Vitals Screenings to include cognitive, depression, and falls Referrals and appointments  No orders of the defined types were placed in this encounter.  In addition, I have reviewed and discussed with patient certain preventive protocols, quality metrics, and best practice recommendations. A written personalized care plan for preventive  services as well as general preventive health recommendations were provided to patient.   Neliah Cuyler, CMA   09/09/2024   No follow-ups on file.  After Visit Summary: (Mail) Due to this being a telephonic visit, the after visit summary with patients personalized plan was offered to patient via mail   Nurse Notes: see note at top of chart

## 2024-10-17 ENCOUNTER — Other Ambulatory Visit: Payer: Self-pay | Admitting: Internal Medicine

## 2024-10-17 DIAGNOSIS — E1142 Type 2 diabetes mellitus with diabetic polyneuropathy: Secondary | ICD-10-CM

## 2024-10-22 ENCOUNTER — Ambulatory Visit: Admitting: Internal Medicine

## 2024-12-10 ENCOUNTER — Ambulatory Visit: Admitting: Internal Medicine

## 2025-09-11 ENCOUNTER — Ambulatory Visit
